# Patient Record
Sex: Female | Born: 1935 | Race: White | Hispanic: No | Smoking: Former smoker
Health system: Southern US, Community
[De-identification: ages and names within clinical notes are randomized; demographics above are authoritative.]

## PROBLEM LIST (undated history)

## (undated) DIAGNOSIS — J329 Chronic sinusitis, unspecified: Secondary | ICD-10-CM

## (undated) DIAGNOSIS — I4891 Unspecified atrial fibrillation: Secondary | ICD-10-CM

## (undated) DIAGNOSIS — G459 Transient cerebral ischemic attack, unspecified: Secondary | ICD-10-CM

## (undated) DIAGNOSIS — E039 Hypothyroidism, unspecified: Secondary | ICD-10-CM

## (undated) DIAGNOSIS — R1310 Dysphagia, unspecified: Secondary | ICD-10-CM

## (undated) DIAGNOSIS — E785 Hyperlipidemia, unspecified: Secondary | ICD-10-CM

## (undated) DIAGNOSIS — N390 Urinary tract infection, site not specified: Secondary | ICD-10-CM

## (undated) DIAGNOSIS — M1990 Unspecified osteoarthritis, unspecified site: Secondary | ICD-10-CM

## (undated) DIAGNOSIS — K219 Gastro-esophageal reflux disease without esophagitis: Secondary | ICD-10-CM

## (undated) DIAGNOSIS — I10 Essential (primary) hypertension: Secondary | ICD-10-CM

## (undated) DIAGNOSIS — D649 Anemia, unspecified: Secondary | ICD-10-CM

## (undated) HISTORY — PX: CATARACT EXTRACTION: SUR2

## (undated) HISTORY — PX: INSERT / REPLACE / REMOVE PACEMAKER: SUR710

## (undated) HISTORY — PX: TOTAL KNEE ARTHROPLASTY: SHX125

## (undated) HISTORY — PX: KNEE ARTHROSCOPY: SUR90

## (undated) HISTORY — PX: PACEMAKER INSERTION: SHX728

---
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
## 1967-12-05 HISTORY — PX: VAGINAL HYSTERECTOMY: SUR661

## 1968-12-04 HISTORY — PX: MASTOIDECTOMY: SHX711

## 1969-12-04 HISTORY — PX: BILATERAL SALPINGOOPHORECTOMY: SHX1223

## 1970-12-04 HISTORY — PX: SINUS EXPLORATION: SHX5214

## 1983-12-05 HISTORY — PX: BACK SURGERY: SHX140

## 1990-12-04 HISTORY — PX: CYSTOSCOPY: SUR368

## 1999-01-28 ENCOUNTER — Encounter: Payer: Self-pay | Admitting: Orthopedic Surgery

## 1999-02-04 ENCOUNTER — Inpatient Hospital Stay (HOSPITAL_COMMUNITY): Admission: RE | Admit: 1999-02-04 | Discharge: 1999-02-09 | Payer: Self-pay | Admitting: Orthopedic Surgery

## 1999-02-09 ENCOUNTER — Inpatient Hospital Stay (HOSPITAL_COMMUNITY)
Admission: RE | Admit: 1999-02-09 | Discharge: 1999-02-14 | Payer: Self-pay | Admitting: Physical Medicine and Rehabilitation

## 1999-03-26 ENCOUNTER — Encounter: Payer: Self-pay | Admitting: Orthopedic Surgery

## 1999-03-26 ENCOUNTER — Ambulatory Visit (HOSPITAL_COMMUNITY): Admission: RE | Admit: 1999-03-26 | Discharge: 1999-03-26 | Payer: Self-pay | Admitting: Orthopedic Surgery

## 1999-04-07 ENCOUNTER — Ambulatory Visit (HOSPITAL_COMMUNITY): Admission: RE | Admit: 1999-04-07 | Discharge: 1999-04-07 | Payer: Self-pay | Admitting: Family Medicine

## 1999-04-07 ENCOUNTER — Encounter: Payer: Self-pay | Admitting: Family Medicine

## 1999-04-08 ENCOUNTER — Encounter: Payer: Self-pay | Admitting: Family Medicine

## 1999-04-08 ENCOUNTER — Ambulatory Visit (HOSPITAL_COMMUNITY): Admission: RE | Admit: 1999-04-08 | Discharge: 1999-04-08 | Payer: Self-pay | Admitting: Family Medicine

## 1999-04-26 ENCOUNTER — Encounter: Payer: Self-pay | Admitting: Internal Medicine

## 1999-04-26 ENCOUNTER — Ambulatory Visit (HOSPITAL_COMMUNITY): Admission: RE | Admit: 1999-04-26 | Discharge: 1999-04-26 | Payer: Self-pay | Admitting: Internal Medicine

## 2000-03-12 ENCOUNTER — Inpatient Hospital Stay (HOSPITAL_COMMUNITY): Admission: RE | Admit: 2000-03-12 | Discharge: 2000-03-16 | Payer: Self-pay | Admitting: Orthopedic Surgery

## 2000-03-16 ENCOUNTER — Inpatient Hospital Stay (HOSPITAL_COMMUNITY)
Admission: RE | Admit: 2000-03-16 | Discharge: 2000-03-23 | Payer: Self-pay | Admitting: Physical Medicine & Rehabilitation

## 2000-03-19 ENCOUNTER — Encounter: Payer: Self-pay | Admitting: Physical Medicine & Rehabilitation

## 2000-03-24 ENCOUNTER — Emergency Department (HOSPITAL_COMMUNITY): Admission: EM | Admit: 2000-03-24 | Discharge: 2000-03-24 | Payer: Self-pay

## 2000-04-10 ENCOUNTER — Encounter: Admission: RE | Admit: 2000-04-10 | Discharge: 2000-07-09 | Payer: Self-pay | Admitting: Orthopedic Surgery

## 2001-08-27 ENCOUNTER — Ambulatory Visit (HOSPITAL_COMMUNITY): Admission: RE | Admit: 2001-08-27 | Discharge: 2001-08-27 | Payer: Self-pay | Admitting: Family Medicine

## 2001-08-27 ENCOUNTER — Encounter: Payer: Self-pay | Admitting: Family Medicine

## 2001-09-23 ENCOUNTER — Ambulatory Visit (HOSPITAL_COMMUNITY): Admission: RE | Admit: 2001-09-23 | Discharge: 2001-09-23 | Payer: Self-pay | Admitting: Rheumatology

## 2001-09-23 ENCOUNTER — Encounter: Payer: Self-pay | Admitting: Rheumatology

## 2001-10-10 ENCOUNTER — Encounter: Admission: RE | Admit: 2001-10-10 | Discharge: 2001-11-28 | Payer: Self-pay | Admitting: Rheumatology

## 2002-01-21 ENCOUNTER — Encounter: Admission: RE | Admit: 2002-01-21 | Discharge: 2002-03-20 | Payer: Self-pay | Admitting: Rheumatology

## 2002-04-05 ENCOUNTER — Encounter: Payer: Self-pay | Admitting: Internal Medicine

## 2002-04-05 ENCOUNTER — Inpatient Hospital Stay (HOSPITAL_COMMUNITY): Admission: EM | Admit: 2002-04-05 | Discharge: 2002-04-12 | Payer: Self-pay | Admitting: *Deleted

## 2002-04-07 ENCOUNTER — Encounter: Payer: Self-pay | Admitting: Internal Medicine

## 2002-04-08 ENCOUNTER — Encounter: Payer: Self-pay | Admitting: Internal Medicine

## 2002-04-10 ENCOUNTER — Encounter: Payer: Self-pay | Admitting: Internal Medicine

## 2002-07-16 ENCOUNTER — Other Ambulatory Visit: Admission: RE | Admit: 2002-07-16 | Discharge: 2002-07-16 | Payer: Self-pay | Admitting: Family Medicine

## 2002-12-02 ENCOUNTER — Emergency Department (HOSPITAL_COMMUNITY): Admission: EM | Admit: 2002-12-02 | Discharge: 2002-12-02 | Payer: Self-pay | Admitting: Emergency Medicine

## 2002-12-02 ENCOUNTER — Encounter: Payer: Self-pay | Admitting: *Deleted

## 2002-12-04 HISTORY — PX: REPAIR / RECONSTRUCTION INTERPHALANGEAL JOINT: SUR1147

## 2003-03-30 ENCOUNTER — Encounter: Payer: Self-pay | Admitting: Specialist

## 2003-03-30 ENCOUNTER — Encounter: Admission: RE | Admit: 2003-03-30 | Discharge: 2003-03-30 | Payer: Self-pay | Admitting: Specialist

## 2003-04-30 ENCOUNTER — Encounter: Payer: Self-pay | Admitting: Emergency Medicine

## 2003-04-30 ENCOUNTER — Emergency Department (HOSPITAL_COMMUNITY): Admission: EM | Admit: 2003-04-30 | Discharge: 2003-04-30 | Payer: Self-pay | Admitting: Emergency Medicine

## 2003-09-01 ENCOUNTER — Encounter: Admission: RE | Admit: 2003-09-01 | Discharge: 2003-09-01 | Payer: Self-pay | Admitting: Orthopedic Surgery

## 2003-09-01 ENCOUNTER — Encounter: Payer: Self-pay | Admitting: Orthopedic Surgery

## 2003-09-03 ENCOUNTER — Ambulatory Visit (HOSPITAL_COMMUNITY): Admission: RE | Admit: 2003-09-03 | Discharge: 2003-09-03 | Payer: Self-pay | Admitting: Orthopedic Surgery

## 2003-09-03 ENCOUNTER — Ambulatory Visit (HOSPITAL_BASED_OUTPATIENT_CLINIC_OR_DEPARTMENT_OTHER): Admission: RE | Admit: 2003-09-03 | Discharge: 2003-09-03 | Payer: Self-pay | Admitting: Orthopedic Surgery

## 2003-11-10 ENCOUNTER — Encounter: Admission: RE | Admit: 2003-11-10 | Discharge: 2003-11-10 | Payer: Self-pay | Admitting: Specialist

## 2003-11-16 ENCOUNTER — Ambulatory Visit (HOSPITAL_COMMUNITY): Admission: RE | Admit: 2003-11-16 | Discharge: 2003-11-16 | Payer: Self-pay | Admitting: Gastroenterology

## 2004-02-09 ENCOUNTER — Encounter: Admission: RE | Admit: 2004-02-09 | Discharge: 2004-02-23 | Payer: Self-pay | Admitting: Orthopedic Surgery

## 2004-03-30 ENCOUNTER — Encounter: Admission: RE | Admit: 2004-03-30 | Discharge: 2004-06-28 | Payer: Self-pay | Admitting: Orthopedic Surgery

## 2004-05-31 ENCOUNTER — Encounter: Admission: RE | Admit: 2004-05-31 | Discharge: 2004-08-24 | Payer: Self-pay | Admitting: Orthopedic Surgery

## 2004-07-23 ENCOUNTER — Encounter: Admission: RE | Admit: 2004-07-23 | Discharge: 2004-07-23 | Payer: Self-pay | Admitting: Family Medicine

## 2004-08-22 ENCOUNTER — Encounter: Admission: RE | Admit: 2004-08-22 | Discharge: 2004-08-22 | Payer: Self-pay | Admitting: Orthopedic Surgery

## 2005-02-27 ENCOUNTER — Inpatient Hospital Stay (HOSPITAL_COMMUNITY): Admission: RE | Admit: 2005-02-27 | Discharge: 2005-03-03 | Payer: Self-pay | Admitting: Specialist

## 2005-02-27 ENCOUNTER — Ambulatory Visit: Payer: Self-pay | Admitting: Physical Medicine & Rehabilitation

## 2005-03-03 ENCOUNTER — Inpatient Hospital Stay
Admission: RE | Admit: 2005-03-03 | Discharge: 2005-03-17 | Payer: Self-pay | Admitting: Physical Medicine & Rehabilitation

## 2005-04-10 ENCOUNTER — Encounter: Admission: RE | Admit: 2005-04-10 | Discharge: 2005-07-09 | Payer: Self-pay | Admitting: Specialist

## 2005-06-11 ENCOUNTER — Emergency Department (HOSPITAL_COMMUNITY): Admission: EM | Admit: 2005-06-11 | Discharge: 2005-06-11 | Payer: Self-pay | Admitting: Emergency Medicine

## 2005-07-18 ENCOUNTER — Encounter: Admission: RE | Admit: 2005-07-18 | Discharge: 2005-08-14 | Payer: Self-pay | Admitting: Specialist

## 2005-07-24 ENCOUNTER — Emergency Department (HOSPITAL_COMMUNITY): Admission: EM | Admit: 2005-07-24 | Discharge: 2005-07-24 | Payer: Self-pay | Admitting: Emergency Medicine

## 2005-08-15 ENCOUNTER — Encounter: Admission: RE | Admit: 2005-08-15 | Discharge: 2005-09-20 | Payer: Self-pay | Admitting: Specialist

## 2005-09-21 ENCOUNTER — Encounter: Admission: RE | Admit: 2005-09-21 | Discharge: 2005-12-20 | Payer: Self-pay | Admitting: Specialist

## 2005-10-29 ENCOUNTER — Inpatient Hospital Stay (HOSPITAL_COMMUNITY): Admission: EM | Admit: 2005-10-29 | Discharge: 2005-11-08 | Payer: Self-pay | Admitting: Emergency Medicine

## 2005-11-01 ENCOUNTER — Encounter: Payer: Self-pay | Admitting: Cardiology

## 2005-11-02 ENCOUNTER — Ambulatory Visit: Payer: Self-pay | Admitting: Physical Medicine & Rehabilitation

## 2005-11-07 ENCOUNTER — Encounter: Payer: Self-pay | Admitting: Internal Medicine

## 2005-11-08 ENCOUNTER — Inpatient Hospital Stay: Admission: RE | Admit: 2005-11-08 | Discharge: 2005-11-11 | Payer: Self-pay | Admitting: Internal Medicine

## 2005-11-08 ENCOUNTER — Ambulatory Visit (HOSPITAL_COMMUNITY): Admission: RE | Admit: 2005-11-08 | Discharge: 2005-11-08 | Payer: Self-pay | Admitting: Internal Medicine

## 2006-04-26 ENCOUNTER — Encounter: Admission: RE | Admit: 2006-04-26 | Discharge: 2006-05-20 | Payer: Self-pay | Admitting: Orthopedic Surgery

## 2006-05-21 ENCOUNTER — Encounter: Admission: RE | Admit: 2006-05-21 | Discharge: 2006-07-29 | Payer: Self-pay | Admitting: Orthopedic Surgery

## 2006-07-30 ENCOUNTER — Encounter: Admission: RE | Admit: 2006-07-30 | Discharge: 2006-09-04 | Payer: Self-pay | Admitting: Orthopedic Surgery

## 2006-09-05 ENCOUNTER — Encounter: Admission: RE | Admit: 2006-09-05 | Discharge: 2006-10-09 | Payer: Self-pay | Admitting: Orthopedic Surgery

## 2006-10-10 ENCOUNTER — Encounter: Admission: RE | Admit: 2006-10-10 | Discharge: 2006-10-29 | Payer: Self-pay | Admitting: Orthopedic Surgery

## 2006-10-30 ENCOUNTER — Encounter: Admission: RE | Admit: 2006-10-30 | Discharge: 2007-01-28 | Payer: Self-pay | Admitting: Orthopedic Surgery

## 2006-12-04 HISTORY — PX: SHOULDER SURGERY: SHX246

## 2007-02-06 ENCOUNTER — Encounter: Admission: RE | Admit: 2007-02-06 | Discharge: 2007-05-07 | Payer: Self-pay | Admitting: Orthopedic Surgery

## 2007-02-06 ENCOUNTER — Ambulatory Visit (HOSPITAL_COMMUNITY): Admission: RE | Admit: 2007-02-06 | Discharge: 2007-02-06 | Payer: Self-pay | Admitting: Family Medicine

## 2007-02-15 ENCOUNTER — Ambulatory Visit (HOSPITAL_COMMUNITY): Admission: RE | Admit: 2007-02-15 | Discharge: 2007-02-15 | Payer: Self-pay | Admitting: Gastroenterology

## 2007-03-21 ENCOUNTER — Encounter (INDEPENDENT_AMBULATORY_CARE_PROVIDER_SITE_OTHER): Payer: Self-pay | Admitting: *Deleted

## 2007-03-21 ENCOUNTER — Ambulatory Visit (HOSPITAL_COMMUNITY): Admission: RE | Admit: 2007-03-21 | Discharge: 2007-03-21 | Payer: Self-pay | Admitting: Gastroenterology

## 2007-04-11 ENCOUNTER — Inpatient Hospital Stay (HOSPITAL_COMMUNITY): Admission: RE | Admit: 2007-04-11 | Discharge: 2007-04-16 | Payer: Self-pay | Admitting: Orthopedic Surgery

## 2007-05-02 ENCOUNTER — Inpatient Hospital Stay (HOSPITAL_COMMUNITY): Admission: AD | Admit: 2007-05-02 | Discharge: 2007-05-06 | Payer: Self-pay | Admitting: Orthopedic Surgery

## 2007-05-02 ENCOUNTER — Ambulatory Visit: Payer: Self-pay | Admitting: Internal Medicine

## 2007-12-05 HISTORY — PX: HEMORRHOID SURGERY: SHX153

## 2008-05-25 ENCOUNTER — Encounter (INDEPENDENT_AMBULATORY_CARE_PROVIDER_SITE_OTHER): Payer: Self-pay | Admitting: General Surgery

## 2008-05-25 ENCOUNTER — Ambulatory Visit (HOSPITAL_COMMUNITY): Admission: RE | Admit: 2008-05-25 | Discharge: 2008-05-25 | Payer: Self-pay | Admitting: General Surgery

## 2009-04-01 ENCOUNTER — Emergency Department (HOSPITAL_COMMUNITY): Admission: EM | Admit: 2009-04-01 | Discharge: 2009-04-01 | Payer: Self-pay | Admitting: Emergency Medicine

## 2009-05-27 ENCOUNTER — Ambulatory Visit: Payer: Self-pay | Admitting: Surgery

## 2009-12-02 ENCOUNTER — Encounter: Admission: RE | Admit: 2009-12-02 | Discharge: 2009-12-02 | Payer: Self-pay | Admitting: Family Medicine

## 2011-03-15 LAB — PROTIME-INR
INR: 1.9 — ABNORMAL HIGH (ref 0.00–1.49)
Prothrombin Time: 22.4 seconds — ABNORMAL HIGH (ref 11.6–15.2)

## 2011-04-18 NOTE — Op Note (Signed)
[name], [name]             ACCOUNT NO.:  [id number]   MEDICAL RECORD NO.:  [id number]          PATIENT TYPE:  INP   LOCATION:  5003                         FACILITY:  [facility]   PHYSICIAN:  Almedia Balls. Ranell Patrick, M.D. DATE OF BIRTH:  November 06, 1931   DATE OF PROCEDURE:  05/02/2007  DATE OF DISCHARGE:                               OPERATIVE REPORT   PREOPERATIVE DIAGNOSIS:  Left shoulder infection.   POSTOPERATIVE DIAGNOSIS:  Left shoulder infection, superficial.   PROCEDURE PERFORMED:  Left shoulder incision and drainage, and placement  of Jackson-Pratt drain.   ATTENDING SURGEON:  Almedia Balls. Ranell Patrick, M.D.   ASSISTANT:  None.   ANESTHESIA:  General.   ESTIMATED BLOOD LOSS:  Minimal.   FLUIDS REPLACED:  1000 mL crystalloid.   COUNTS:  Correct.   COMPLICATIONS:  None.   URINE OUTPUT:  700 mL.   INDICATIONS:  The patient is a 77 year old female status post left  shoulder arthroplasty now about a month ago.  The patient presented with  a several day history of increasing swelling, redness, and some drainage  coming from the inferior portion of her incision.  The patient  clinically has an infection and a little bit of dehiscence in her  inferior wound.  Due to the patient's arthroplasty, we are recommending  to the patient and her family that we take her to surgery for I&D to  prevent this from becoming a septic joint.  They are in agreement with  this.  Informed consent was obtained.   DESCRIPTION OF PROCEDURE:  After an adequate level of anesthesia was  achieved, the patient was positioned in modified beach-chair position.  All neurovascular structures were padded appropriately.  Left shoulder  was sterilely prepped and draped in the usual manner.  We opened up the  patient's prior deltopectoral incision.  Over the inferior extent there  appeared to be some darker serous fluid that was coming out from the  wound.  We cultured this down deep, which did go into the deep  subcutaneous layer.  We obtained aerobic/anaerobic cultures and Gram  stain.  At this point we debrided all necrotic tissue.  This did not go  deep to muscle, and the deltopectoral interval was non-violated.  This  extended only for about 3 or 4 cm.  We went ahead and opened up the  bottom 3/4 of the incision, just to make sure we had a good look at the  entire field.  Again, all necrotic tissue was debrided and this was just  a little bit of marginal necrosis in the fat area.  It did appear that  there have been some liquefaction of the fat, which may contribute to a  seroma and possibly stitch abscess colonizing that.  We thus evacuated  out all of the fluid.  We did not find this going deep into the joint.  We did a pulse irrigation with 3 L of normal saline irrigation.  All  wound margins were bleeding nicely, and all necrotic tissue was removed.  We then closed over a flat JP drain with a combination of simple and  vertical  mattress 2-0 nylon suture, closing the wound completely.  We  will pull that drain tomorrow and wind up placing her on IV vancomycin  and Fortaz per Jonny Ruiz Campbell's recommendation with Infectious Diseases.  We will be tracking her culture data closely, and will adjust  antibiotics as needed.      Almedia Balls. Ranell Patrick, M.D.  Electronically Signed     SRN/MEDQ  D:  05/02/2007  T:  05/03/2007  Job:  [id number]
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
## 2011-04-21 NOTE — Op Note (Signed)
[name], [name]             ACCOUNT NO.:  [id number]   MEDICAL RECORD NO.:  [id number]          PATIENT TYPE:  INP   LOCATION:  2550                         FACILITY:  [facility]   PHYSICIAN:  Almedia Balls. Ranell Patrick, M.D. DATE OF BIRTH:  09-27-1931   DATE OF PROCEDURE:  04/11/2007  DATE OF DISCHARGE:                               OPERATIVE REPORT   PREOPERATIVE DIAGNOSIS:  Left shoulder osteoarthritis, end stage, and  likely secondary to rotator cuff deficiency.   POSTOPERATIVE DIAGNOSIS:  Left shoulder osteoarthritis, end stage, and  likely secondary to rotator cuff deficiency.   PROCEDURE PERFORMED:  Left shoulder hemiarthroplasty using the DePuy  [employer] Advantage system with CTA head.   ATTENDING SURGEON:  Almedia Balls. Ranell Patrick, M.D.   ASSISTANT SURGEON:  Donnie Coffin. Durwin Nora, P.A.   ANESTHESIA:  General anesthesia was used.   ESTIMATED BLOOD LOSS:  200 mL.   FLUID REPLACEMENT:  1200 mL crystalloid.   SURGICAL COUNTS:  Instrument counts were correct.   COMPLICATIONS:  None.   MEDICATIONS:  Perioperative antibiotics were given.   INDICATIONS FOR PROCEDURE:  The patient is a 77 year old female with a  history of prior left shoulder surgery who has gone on to rotator cuff  deficiency and chronic pain in the shoulder.  The patient has bone-on-  bone on the axillary radial radiograph and crepitance with range of  motion.  She has had profound functional loss and debilitating pain  secondary to her shoulder condition.  She presents now for operative  shoulder arthroplasty to restore function and eliminate pain.  Informed  consent was obtained.   DESCRIPTION OF PROCEDURE:  After an adequate level of anesthesia was  achieved, the patient was positioned in the modified beach-chair  position, and all neurovascular structures were padded appropriately,  the left shoulder was sterilely prepped and draped in the usual manner.  Entered the shoulder through a deltopectoral approach, starting  at the  coracoid and extending distally to the anterior aspect of the humerus.  Dissection was carried down to the subcutaneous tissues and found the  cephalic vein took it laterally with the deltoid and the pectoralis  medially, released the upper 0.5 cm of pectoralis, identified the  conjoint tendon and retracted that medially.  There was quite a bit of  bursitis present in the shoulder and we removed all the bursal tissue,  identified the sub scapularis which was in terrible condition and  removed that off the lesser tuberosity.  The patient's preoperative  external rotation was about 70 degrees.  Thus I was suspicious, based on  arthritis and 70 degrees of external rotation, that the patient had not  much in the way of a sub scapularis.  She did wind up having some  adequate tissue with the sub scapularis, it just appeared to be very,  very atrophied.   We went ahead and placed two #2 FiberWire sutures in the Mason-Allen  suture technique into the free end of the sub scapularis, released it  off the anterior glenoid, tried to free that up off the inferior portion  of the coracoid as well.  We then  retracted that out of the way,  identified the patient's rotator cuff which was basically scarred to the  glenoid.  It was in terrible condition as well and basically it appeared  all to be scar tissue and not much in the way of healthy-looking tendon.  We removed all that tissue, exposing the superior humeral head.  All  extraneous soft tissue was removed.   We then prepared the humerus in the following manner:  First we  performed a cut with the oscillating saw for the head.  We did this with  the elbow in 20 degrees of external rotation using a neck cut guide.  Once we had cut that off, we sized the humeral head to a size 48.  We  then went ahead and sequentially reamed up to a size 12 and then with a  good decent bite with the 12, and then went ahead and used our broach to  broach up  to a size 12 implant as well.  This was a Theme park manager  system by [employer].  Went ahead and inserted the 12 trial stem in place,  then placed a 48 x 18 head initially which had good coverage, but not  quite enough soft tissue tension force, as her deltoid was in terrible  condition as well.  We went in and selected a 23 mm thick implant to  further tension the soft tissues; that seemed to do a lot better, with  posterior head translation of 50%, and inferior head translation of 50%.  We then selected these as the real implants, the DePuy Global Advantage  stem, followed by the 48 x 23 head.  We removed the trial implants,  thoroughly irrigated, placed drill holes in the lesser tuberosity  through which we placed #2 FiberWire suture.  We then inserted the real  implant with patch and grafting with a cancellous graft taken from the  head.  We had a nice, secure fit.   We placed the CTA head 23 mm x 48 mm in place, impacted that in place,  reduced the shoulder, had nice soft tissue stability and balance.  Then  repaired the sub scapularis to the sutures that were coming out of the  lesser tuberosity with good repair of the soft tissue there.  We then  went ahead and thoroughly irrigated and then closed the deltopectoral  interval with 0 Vicryl suture; followed by 2-0 Vicryl subcutaneous; and  4-0 Monocryl for the skin.  The patient's passive rotation was  outstanding, nice and smooth, with no instability whatsoever.  The  patient's CA ligament was visualized during surgery and appeared to be  in pristine condition.      Almedia Balls. Ranell Patrick, M.D.  Electronically Signed     SRN/MEDQ  D:  04/11/2007  T:  04/11/2007  Job:  [id number]

## 2011-04-21 NOTE — H&P (Signed)
[name], [name]             ACCOUNT NO.:  [id number]   MEDICAL RECORD NO.:  [id number]          PATIENT TYPE:  INP   LOCATION:  1412                         FACILITY:  [facility]   PHYSICIAN:  Melissa L. Ladona Ridgel, MD  DATE OF BIRTH:  December 20, 1930   DATE OF PROCEDURE:  DATE OF DISCHARGE:                      STAT - MUST CHANGE TO CORRECT WORK TYPE   CHIEF COMPLAINT:  Chest pain, headache.   PRIMARY CARE PHYSICIAN:  Holley Bouche, M.D.   HISTORY OF PRESENT ILLNESS:  This is a 77 year old white female who presents  with the complaint of chest pain, worse with deep inspiration and traveling  into the neck and the back of her neck as well as her shoulders.  The  patient states nothing has made it better and nothing has made it worse.  It  is associated with a headache.  There in no change in the pain when she  changes position from sitting up to lying down.  She also is a little bit  dizzy.  The patient states that three months ago she had a similar  presentation and was treated for a urinary tract infection with Cipro, and  her symptoms went away.  It is the first time in awhile that she has had any  discomfort.  In the emergency room, the patient was found to have increased  D-dimer.  Thus, a CT was done which showed no obvious PE, but she does have  a small pleural effusion.   REVIEW OF SYSTEMS:  She has a headache, urinary tract infection, chronic  back pain, poor vision with dry eyes, hypercholesterolemia, gout,  hypertension. All other review of systems are negative.   PAST SURGICAL HISTORY:  1.  Knee replacement.  2.  Two back surgeries.  3.  Left shoulder surgery.   ALLERGIES:  SULFA AND DEMEROL.  DEMEROL REALLY JUST CAUSES RESTLESSNESS.   CURRENT MEDICATIONS:  1.  Cipro 500 b.i.d. daily for the last six days.  2.  Restasis one drop b.i.d.  3.  Nexium 40 mg daily.  4.  Potassium, unknown dose.  5.  Premarin 1.25 daily.  6.  Zocor, unknown dose.  7.  Cozaar 50 mg  daily.  8.  Nasonex one spray q.h.s.  9.  Cymbalta, dose is unrecognized.  10. Tylenol with Codeine No. 3., as needed.  11. Lasix 40 mg b.i.d.  12. She takes a thyroid medication but does not know which one it is.  13. Detrol 80 mg q.h.s.  14. Allopurinol 200 mg daily.  15. Lopressor 50 b.i.d.  16. Celebrex 200 mg b.i.d.   PHYSICAL EXAMINATION:  VITAL SIGNS:  Temperature 97.7, blood pressure last  of 91/53; however, previous blood pressures were within normal limits at  142/82 and 139/53; pulse 77, respirations 16, saturation 95% on room air.  GENERAL:  This is a mildly distressed white female secondary to a headache  and chest discomfort.  HEENT:  She is normocephalic, atraumatic.  Pupils are equal, round, and  reactive to light.  Extraocular muscles are intact.  Mucous membranes are  moist.  NECK:  Supple.  There is no JVD, no  lymph nodes, no thyromegaly.  CHEST:  Decreased breath sounds at the left base greater than right with  crackles in deep inspiration.  CARDIOVASCULAR:  Regular rate and rhythm with frequent PVCs.  Positive S1  and S2.  No S3 or S4.  2/6 systolic ejection murmur at the left sternal  border.  The patient has reproducible chest wall pain to palpation.  ABDOMEN:  Soft, nondistended, with tenderness in all quadrants.  Palpation  in the epigastrium is the more focal area involving the left upper quadrant.  EXTREMITIES:  No clubbing, cyanosis, or edema.  NEUROLOGIC:  Power is intact at 5/5.  DTRs 2+.  Cranial nerves II-XII appear  to be intact.  Finger-to-nose is intact.   LABORATORY DATA:  LFTs are within normal limits.  Point-of-care enzymes are  negative x3.   ASSESSMENT:  This is a 77 year old white female with persistent atypical  chest pain x1 day.  By description, this sounds noncardiac, but  electrocardiogram has changes consistent with frequent premature ventricular  contractions and old myocardial infarction with Q waves in leads II, III,  aVF.    PLAN:  1.  Cardiovascular.  Atypical chest pain.  She needs to be ruled out on      cardiac enzymes.  Will check them q.8h. x3 sets.  Will check a 2-D      echocardiogram on Monday.  For now, we will give her Mylanta x1 and      continue her Protonix b.i.d.  Will consult [facility] in the      morning.  2.  Pulmonary.  CT of the chest is negative for PE.  She does have      atelectasis with decreased inspirations secondary to pain.  Flutter      valve and nebulizer treatments for this.  3.  Gout.  Will continue her allopurinol; however, I believe that the dose      should be 100 mg once daily because of her decreased creatinine      clearance.  4.  GU.  She has recently been treated for a UTI with Cipro x6 days.  I will      discontinue this but follow up on her urine culture.  5.  Endocrine - hypothyroid.  The patient states that she recently started      medications but she does not know the name.  Will attempt to find out      from her pharmacy the name of her thyroid replacement.  6.  Neurologic.  She has headaches persistently.  The patient usually takes      Fiorinal when she gets a headache like this.  There are no focal      deficits noted.  Will get a CT of the chest if her symptoms persist.  7.  GI.  Possible reflux could explain the anterior chest wall pain and      persistent pain despite pain medications.  We will continue her Protonix      q.12h., give her a dose of Mylanta now, and consider GI consult if      indeed she      continues to have noncardiac chest pain.  8.  Psychiatric.  Continue her Cymbalta once we clarify her amounts.  9.  DVT prophylaxis with Lovenox 1 mg per kg.      Melissa L. Ladona Ridgel, MD  Electronically Signed     MLT/MEDQ  D:  10/29/2005  T:  10/29/2005  Job:  [id number]  cc:   Holley Bouche, M.D.  Fax: [phone]

## 2011-04-21 NOTE — H&P (Signed)
NAMEKEITHA, [name]             ACCOUNT NO.:  [id number]   MEDICAL RECORD NO.:  [id number]          PATIENT TYPE:  AMB   LOCATION:  ENDO                         FACILITY:  [facility]   PHYSICIAN:  Bernette Redbird, M.D.   DATE OF BIRTH:  January 25, 1931   DATE OF ADMISSION:  03/21/2007  DATE OF DISCHARGE:  03/21/2007                              HISTORY & PHYSICAL   CHIEF COMPLAINT:  Left shoulder pain.   HISTORY OF PRESENT ILLNESS:  The patient is a 77 year old female  complaining about worsening of the left should pain that has been  refractory to conservative treatment.  The patient has elected to have a  hemiarthroplasty by Dr. Malon Kindle.   PAST MEDICAL HISTORY:  1. Gastroesophageal reflux disease.  2. Recurrent urinary tract infections.  3. Hypothyroidism.  4. Osteoarthritis.   FAMILY MEDICAL HISTORY:  Coronary artery disease and hypertension.   SOCIAL HISTORY:  A patient of Dr. Tiburcio Pea.  She is divorced and retired.   ALLERGIES:  SULFA.   MEDICATIONS:  1. Levothyroxine 0.025 mcg p.o. daily.  2. Sotalol 80 mg p.o. b.i.d.  3. Simvastatin 40 mg p.o. daily.  4. Nexium 40 mg p.o. daily.  5. Allopurinol 100 mg p.o. b.i.d.  6. Potassium 10 mEq p.o. b.i.d.  7. Lasix 40 mg p.o. once.  8. Cymbalta 60 mg p.o. daily.  9. Detrol 4 mg 2 tablets nightly.  10.Celebrex 200 mg daily p.r.n.   REVIEW OF SYSTEMS:  Pain with range of motion in the left shoulder and  also a concurrent urinary tract infection being treated by Cipro.   PHYSICAL EXAMINATION:  VITAL SIGNS:  Pulse 82, respirations 18, blood  pressure 126/78.  GENERAL:  This is a healing appearing 77 year old female in no acute  distress.  Alert and oriented here in the office today.  Appropriate  affect her patient.  NECK:  Full range of motion without any tenderness.  NEURO:  Cranial nerves II-XII grossly intact.  Intact bilateral upper  and lower extremities.  CHEST:  Has active breath sounds bilaterally with no wheezes,  rales, or  rhonchi.  HEART:  Shows regular rate and rhythm.  No murmur.  ABDOMEN:  Nontender, nondistended with active bowel sounds.  EXTREMITIES:  Show moderate tenderness with range of motion of the left  shoulder, especially with any type of forward flexion past 80 degrees or  external rotation past 30 degrees.  SKIN: Shows no rashes.   IMAGING:  X-rays showed left shoulder osteoarthritis.   IMPRESSION:  Left shoulder osteoarthritis, which is end-stage.   PLAN:  The plan of action of the left shoulder hemiarthroplasty by Dr.  Malon Kindle.      Thomas B. Ferne Coe.    ______________________________  Bernette Redbird, M.D.    TBD/MEDQ  D:  03/28/2007  T:  03/28/2007  Job:  [id number]

## 2011-04-21 NOTE — Op Note (Signed)
[name], [name]             ACCOUNT NO.:  [id number]   MEDICAL RECORD NO.:  [id number]          PATIENT TYPE:  AMB   LOCATION:  ENDO                         FACILITY:  [facility]   PHYSICIAN:  Bernette Redbird, M.D.   DATE OF BIRTH:  1931/06/17   DATE OF PROCEDURE:  03/21/2007  DATE OF DISCHARGE:                               OPERATIVE REPORT   PROCEDURE:  Upper endoscopy (duodenoscopy)   INDICATIONS:  A 77 year old female with dilated bile duct noted as part  of evaluation for persisting right-sided abdominal pain of unclear  cause.  Rule out ampullary tumor since the bile duct appeared to be slightly  dilated all the way down to the level of the ampulla.   FINDINGS:  No evidence of proximal duodenal tumor.   DESCRIPTION OF PROCEDURE:  The procedure had been discussed with the  patient and she provided written consent.  Sedation was fentanyl 100 mcg  and Versed 10 mg IV prior to and during the course of the procedure  without any arrhythmias or desaturation any arrhythmias or desaturation,  to a level of mild to moderate sedation.   The Pentax video duodeno scope was used for this procedure because the  main purpose was to visualize the ampullary region.  It was passed  blindly into the esophagus without difficulty.  The esophageal mucosa,  where visualized, was normal although due to the side-viewing character  of the scope, of course tubular visualization of the esophagus was not  achieved.   The stomach contained a small bilious residual.  There was a little bit  of antral erythema but no erosions, ulcers, polyps or masses were seen  anywhere in the stomach.  It is felt that a pretty good gastric  evaluation was performed including a retroflexed view of the proximal  stomach, although visualization of the cardia itself was not  accomplished.  The pylorus looked normal.  The duodenum was entered and  was examined back and forth approximately four times.  I visualized what  I believe was the major papilla as evidenced by a fairly typical  papillary appearance and a longitudinal fold, although it was not  draining any bile nor was there any bile in the duodenal lumen at the  time of this exam.  Back-and-forth and fairly far down the duodenum, I  examined and reinspected without any evidence of a mass lesion tumor,  friability, diverticular change, or other anatomic abnormalities.  It  did appear that there was at least a possibility that the major papilla  was sort of bi-lobed or consisted of two papillae, one of which  conceivably could be draining the major pancreatic duct.  A definite  minor papilla was not identified.   The scope was then removed from the patient.  She tolerated the  procedure well and there were no apparent complications.   IMPRESSION:  Grossly normal upper endoscopic evaluation using  duodenoscope, without source of biliary ductal dilatation endoscopically  evident (793.3).   PLAN:  For the moment, no further evaluation of the patient's biliary  system is anticipated, although we discussed options at ERCP or  EUS.  I  would favor orthopedic or neurosurgical evaluation for the right-sided  pain to help make sure it is not due to a radiculopathy           ______________________________  Bernette Redbird, M.D.     RB/MEDQ  D:  03/21/2007  T:  03/21/2007  Job:  [id number]   cc:   Holley Bouche, M.D.

## 2011-04-21 NOTE — Discharge Summary (Signed)
[name], [name]             ACCOUNT NO.:  [id number]   MEDICAL RECORD NO.:  [id number]          PATIENT TYPE:  INP   LOCATION:  5039                         FACILITY:  [facility]   PHYSICIAN:  Almedia Balls. Ranell Patrick, M.D. DATE OF BIRTH:  Jul 25, 1931   DATE OF ADMISSION:  04/11/2007  DATE OF DISCHARGE:  04/15/2007                         DISCHARGE SUMMARY - REFERRING   ADMISSION DIAGNOSIS:  Left shoulder osteoarthritis which is end stage  secondary to rotator cuff deficiency.   DISCHARGE DIAGNOSES:  1. Left shoulder osteoarthritis, status post hemiarthroplasty.  2. Blood loss anemia.   BRIEF HISTORY:  The patient is a 77 year old female with a history of  prior left shoulder surgery.  She has elected to have a left shoulder  hemiarthroplasty by Dr. Malon Kindle secondary to profound pain,  worsening symptoms that were refractory to conservative treatment.   PROCEDURE:  The patient had a left shoulder hemiarthroplasty completed  by Dr. Malon Kindle on 04/11/2007.  Assistant was [employer], PA-C.  General anesthesia was used, 200 cc of estimated blood loss.  Fluid  replacement was 1200 cc.   HOSPITAL COURSE:  The patient was admitted on 04/11/2007, for the above-  stated procedure which she tolerated well.  After adequate time in post-  anesthesia care unit, she was transferred up to 5000.  On postop day  one, the patient complained of moderate pain in the left shoulder.  The  patient was able to tolerate some gentle physical therapy but her  hemoglobin and hematocrit were noted to be dropped down to 8.5 and 26.2  and, thus, she did get two units of packed red blood cells after her  hemoglobin dropped to 8.1.  On postop day two, according to the nursing  report and phone call, the patient was confused and, thus, urinalysis  was ordered which was benign.  Chest x-ray was ordered which was benign.  Do believe this was mainly due to the blood loss anemia that the patient  had  developed.  Her incision was healing well throughout the entire  stay.  No signs of erythema or drainage.  Neurovascularly, she was  intact.  She was kept in a sling.   DISCHARGE PLAN:  The patient is being discharged to [facility] on 04/15/2007.   CONDITION:  Stable.   DIET:  Low sodium.   FOLLOWUP:  The patient will follow back up with Dr. Malon Kindle in two  weeks.   ALLERGIES:  SULFA, DEMEROL, AND VIOXX.   DISCHARGE MEDICATIONS:  Allopurinol 100 mg p.o. b.i.d.; Restasis eye  drops, one drop b.i.d. each eye; Cymbalta 60 mg p.o. daily; Lasix 40 mg  b.i.d.; levothyroxine 25 mcg p.o. daily; Robaxin 500 mg p.o. q.6h.,  Tylox  80 mg p.o. daily; prednisolone ophthalmic drops, two drops each eye  q.i.d.; Zocor 40 mg p.o. q.h.s.; sotalol 80 mg p.o. b.i.d.; Detrol 4 mg  p.o. q.h.s., Vicodin 5 mg one to two tabs q.4-6h. p.r.n. pain; Dulcolax  suppositories 10 mg per rectum p.r.n.      Thomas B. Durwin Nora, P.A.      Almedia Balls. Ranell Patrick, M.D.  Electronically Signed    TBD/MEDQ  D:  04/15/2007  T:  04/15/2007  Job:  [id number]

## 2011-04-21 NOTE — Discharge Summary (Signed)
NAMEBLAKLEY, MICHNA             ACCOUNT NO.:  [id number]   MEDICAL RECORD NO.:  [id number]          [name] TYPE:  ORB   LOCATION:  4533                         FACILITY:  [facility]   PHYSICIAN:  Ranelle Oyster, M.D.DATE OF BIRTH:  1931-05-31   DATE OF ADMISSION:  03/03/2005  DATE OF DISCHARGE:  03/17/2005                                 DISCHARGE SUMMARY   DISCHARGE DIAGNOSES:  1.  L2-L5 stenosis with neurogenic claudication requiring decompression L2-      L5, foraminotomy L5.  2.  Urinary tract infection, currently treated.  3.  Hypertension.  4.  Dehydration, much improved.  5.  Sedation, resolved.   HISTORY OF PRESENT ILLNESS:  [name] is a 77 year old female with  history of hypertension, DJD, DDD with neurogenic claudication and pain in  bilateral hips and thighs secondary to mild L2-L3 stenosis, moderate to  severe L3-L4 and 4-5 stenosis, and severe degenerative changes L1-L2.  [name]  elected to undergo central decompressive laminectomy L2-L5 with foraminotomy  L5 root by Dr. Otelia Sergeant on March 27.  Postoperatively has had problems with  electrolyte abnormalities with hypokalemia, hyponatremia requiring treatment  with IV fluids.  [name] is also noted to have problems with fevers as well as  lethargy that is resolving.  [name] is noted to have problems with urinary  retention with question of UTI and was started on empiric antibiotics today.  Therapies were initiated and [name] is at mod assist for bed mobility, min  assist for transfers with max assist ambulating 8 feet with slow gait.  Continues to require min cues to don and doff brace affecting all activity  level.  SACU was consulted for progression.   PAST MEDICAL HISTORY:  1.  Dyslipidemia.  2.  Esophageal dilatation.  3.  Bilateral total knee replacement and shoulder surgery.  4.  Mastoidectomy.  5.  Bladder surgery x2 with incontinence.  6.  Frequent UTIs.  7.  Appendectomy.  8.  History of pyelonephritis with  bacteremia in 2003.  9.  Allergies.  10. History of CHF secondary to fluid overload.  11. Cystoscopy with laser surgery of bladder.  12. Enterocele repair.  13. Hysterectomy.  14. Oophorectomy.  15. Hypertension.  16. Gout.   ALLERGIES:  SULFA.   SOCIAL HISTORY:  [name] lives in a multilevel home with five steps at  entry.  [name] was independent prior to admission.  Son sleeps over at night  and assists the RN.  [name] does not use any tobacco or alcohol.   HOSPITAL COURSE:  [name] was admitted to [facility] on March 03, 2005 for  SACU level therapies to consist of PT/OT daily.  At time of admission  [name] continued to have a lot of complaints regarding pain and spasms.  Low dose Neurontin was started to help with neuropathy.  [name] was  initially on Cipro secondary to questionable UTI.  Urine culture drawn  initially on April 1 showed no growth.  [name] was noted to have problems  with urinary retention requiring in-and-out catheterization.  Her Detrol LA  was held.  A repeat UA was done on  April 5 showing E. coli that was  resistant to Cipro and ampicillin.  [name] was started on Macrodantin and is  currently being treated with this for 10 total days of antibiotic therapy.  Secondary to urinary retention [name] was started on Urecholine with dose  slowly being increased to help with voiding problems.  Secondary to  continued complaints of flank pain, OxyContin CR was added as well as  Baclofen to help with spasticity problems.  However, with narcotics onboard,  [name] was noted to develop lethargy with confusion and disorientation.  This was discontinued with pain being managed by Tylenol alone which greatly  improved [name]'s symptomatology.  Neurontin continues onboard and has  helped with her neuropathy.  As [name]'s mobility improved, bladder  functioning slowly normalized.  Urecholine was increased initially to 50  t.i.d.; however, [name] was noted to have side effects of  diarrhea with  this.  This dose was decreased to 25 b.i.d. and currently [name] is voiding  without difficulty.  [name] was given a taper of Urecholine over the next two  weeks and has been advised not to resume Detrol LA.   Laboratories done past admission showed [name] to be dehydrated with BUN at  37, creatinine at 1.7.  [name] was encouraged to push p.o. fluids.  However,  [name] continued with renal insufficiency with BUN 36, creatinine 1.6, and low  blood pressures.  The [name]'s Cozaar and Lasix was held and continues to  be on hold currently.  [name]'s blood pressures have been monitored on  b.i.d. basis and are ranging from 110s-150s systolic, 60s-70s diastolic on  Lopressor 50 mg b.i.d.  Home health nurse has been arranged to follow up  with BP checks and [name] to follow up with LMD in two weeks for recheck  blood pressures with instructions regarding resuming medications.  The  [name]'s postoperative anemia was followed along.  CBC last March 30 showed  H&H 9.7 and 28.1.  This was improved on April 3 with hemoglobin at 11.3,  32.9, however, question laboratory error as repeat CBC of April 12 showed  H&H at 9.7, 28.8.  [name] did have issues with hematochezia.  Did report a  history of hemorrhoids.  However, recheck CBC of April 13 showed some drop  with hemoglobin of 9.3, hematocrit 28.0.  Dr. Luther Parody, GI was consulted  for input.  He felt [name]'s bleeding was likely due to hemorrhoids.  No  worrisome ongoing bleeding noted.  He recommended discharging [name] on  Anusol [facility] suppositories with outpatient follow-up.   At time of discharge [name]'s pain is well controlled on p.r.n. Tylenol.  [name] denies ever having any pain.  Dr. Otelia Sergeant had followed up on [name] and  recommended resuming Celebrex 20 mg b.i.d. basis; however, secondary to  renal insufficiency as well as hematochezia [name] has been instructed  regarding side effects of GI bleed as well as worsening of  renal insufficiency with recommendations for close follow-up with Dr. Otelia Sergeant and  her LMD if Celebrex was resumed.  [name]'s back incision has been healing  well without any signs or symptoms of infection.  This is intact.  A brace  is currently to be used on p.r.n. basis.  Given her stay in subacute, Ms.  Whitehead progressed to modified independent level for ADLs, modified  independent for toileting.  [name] is modified independent for transfers,  modified independent for ambulating 200 feet with rolling walker.  Further  follow-up therapies to include home health PT/OT by [facility].  On March 17, 2005 [name] is discharged to home.   DISCHARGE MEDICATIONS:  1.  Premarin 1.25 mg daily.  2.  Neurontin 100 mg q.h.s.  3.  Ambien 5 mg q.h.s.  4.  Nexium 40 mg b.i.d.  5.  Lopressor 50 mg b.i.d.  6.  Restasis 0.05% one drop OU b.i.d.  7.  __________ 40 mg daily.  8.  Prozac 20 mg daily.  9.  Macrobid 100 mg b.i.d.  10. Celebrex 200 mg per day x5 days, then discontinue.  11. Urecholine 25 mg b.i.d. x1 week, then 25 mg a day x1 week, then      discontinue.   ACTIVITY:  Follow back precautions.   DIET:  Regular.  Drink plenty of fluids.   WOUND CARE:  Keep area clean and dry.   SPECIAL INSTRUCTIONS:  Tylenol as needed for pain.  Anusol HC suppositories  b.i.d. for a week.  Do not use Detrol LA, Lasix, K-Dur, or Cozaar.   FOLLOW-UP:  [name] to follow up with Dr. Darrelyn Hillock and Dr. Leonides Sake in  one to two weeks.  Follow up with Dr. Matthias Hughs for GI issues.  Follow up with  Dr. Riley Kill as needed.      PP/MEDQ  D:  03/17/2005  T:  03/17/2005  Job:  [id number]   cc:   Holley Bouche, M.D.  [address]  [place], [place] [address]  Fax: [phone]   Althea Grimmer. Luther Parody, M.D.  [address]  [place]  [place] [address]  Fax: [phone]   Kerrin Champagne, M.D.  [address]  [place]  [place] [address]  Fax: [phone]

## 2011-04-21 NOTE — Discharge Summary (Signed)
[place]  Patient:    [name], [name] [facility]                  MRN: [id number] Adm. Date:  [id number] Disc. Date: 03/23/00 Attending:  Faith Rogue T Dictator:   Bynum Bellows. Idacavage, P.A.C. CC:         Faith Rogue, M.D.             Trudee Grip, M.D.             Barton Fanny, M.D., [facility]                           Discharge Summary  DIAGNOSES:  1. Status post right total knee arthroplasty.  2. Coumadin anticoagulation.  3. Hypertension.  4. Hypercholesterolemia.  5. Gastroesophageal reflux disease.  6. Osteoarthritis.  7. Headache.  HISTORY OF PRESENT ILLNESS: The patient is a 77 year old female with a history of end-stage osteoarthritis who elected to undergo right total knee arthroplasty on March 12, 2000 by Dr. Despina Hick.  She was placed on Coumadin for DVT prophylaxis.  She did have a hemoglobin down to 8.6 and received 2 units of packed red blood cells for this, and her hemoglobin responded to 10.9.  She did have problems with unrelenting headache which she feels is due to pain medicine, and it was felt would require inpatient rehabilitation and she was therefore transferred to the inpatient rehabilitation unit on March 16, 2000. It is noted that she lives with her son in a two-level home with three steps to entry, and she can stay on the first floor.  Her son does work during the day.  HOSPITAL COURSE: She was admitted to [facility] rehabilitation unit, where she has participated in three hours per day of physical and occupational therapy.  She did continue to have headaches, which she felt were only relieved with Fioricet with codeine; however, she did have mild increase in her LFTs and this was discontinued due to the acetaminophen.  She was placed on scheduled Ultram as well as p.r.n. oxycodone.  She did have a fall while in the hospital and was noted to have some right orbital ecchymosis as well  as sternal soreness.  X-rays were taken of her knee and there was an intramedullary lucency which was inconsequential as noted by Dr. Despina Hick, most likely secondary to IM instrumentation utilized at surgery.  She had no evidence of fracture.  It was felt that the lucency would require evaluation again at a six week postoperative visit if it was still present on x-ray.  She was resumed on weightbearing as tolerated.  At this point in time she was felt to be a suitable candidate or discharge home due to the fact that she is modified independent in her physical therapy as well as with her ADLs.  She is currently ambulating 150 feet with a standard walker.  DISCHARGE MEDICATIONS:  1. Coumadin 3 mg 2 tablets q.d. until Apr 17, 2000.  2. Ultram 50 mg q.i.d.  3. Prilosec 20 mg q.d.  4. Trinsicon t.i.d.  5. OxyIR 5 mg 1-2 q.4h as-needed for pain.  6. Mevacor 40 mg q.d.  7. Lopressor 50 mg q.12h.  8. Triamterene/hydrochlorothiazide 37.5/25 b.i.d.  9. Micro-K 600 mg qd.  DISCHARGE ACTIVITY:  Weightbearing as tolerated, right leg.  DISCHARGE DIET: She is to follow a balanced diet.  WOUND CARE: She was instructed to clean the wound  daily.  DISCHARGE INSTRUCTIONS: She will have home health physical therapy as well as a nurse will draw a pro time on March 26, 2000 with results to be sent to Dr. Luciana Axe.  FOLLOW-UP: She is instructed to follow up with Dr. Despina Hick in two weeks and with Dr. Luciana Axe as scheduled.  DISCHARGE CONDITION: Stable. DD:  03/22/00 TD:  03/23/00 Job: [id number]

## 2011-04-21 NOTE — Discharge Summary (Signed)
NAMEARIBELLE, MCCOSH             ACCOUNT NO.:  [id number]   MEDICAL RECORD NO.:  [id number]          PATIENT TYPE:  OUT   LOCATION:  EKG                          FACILITY:  [facility]   PHYSICIAN:  Deirdre Peer. Polite, M.D. DATE OF BIRTH:  July 08, 1931   DATE OF ADMISSION:  11/08/2005  DATE OF DISCHARGE:  11/08/2005                                 DISCHARGE SUMMARY   DISCHARGE DIAGNOSES:  1.  Tachy-brady syndrome status post pacer.  2.  Status post Cardiolite, negative for ischemia.  3.  Hypothyroidism.  4.  Urinary tract infection treated.  5.  Probable pneumonia treated empirically.  6.  Paroxysmal atrial fibrillation.  7.  Gout.  8.  History of hypertension.  9.  Renal insufficiency improved.  10. Deconditioning for transfer to SACU.   MEDICATIONS:  1.  Sotalol 80 mg twice daily.  2.  Coumadin 5 mg daily, and ought to be followed by Dr. Logan Bores.   CONSULTATIONS:  Dr. Logan Bores, [facility].   PROCEDURE:  1.  Status post Cardiolite negative for ischemia.  2.  Status post percutaneous transvenous pacer.  3.  Chest x-ray atelectasis versus infiltrate of the left base.   HISTORY OF PRESENT ILLNESS:  A 77 year old white female presented to the  hospital with complaint of chest pain worse with deep inspiration in the ED.  The patient was evaluated.  Admission was deemed necessary for further  evaluation and treatment.  Please see dictated H and P for further details.   PAST MEDICAL HISTORY:  Per admission H and P.   MEDICATIONS ON ADMISSION:  Per admission H and P.   HOSPITAL COURSE:  A 77 year old female again presented to the hospital with  complaints of chest pain which appeared to be atypical for cardiac source.  The patient had cardiac enzymes which were negative for ischemia.  The first  day of hospitalization, the patient did have a rapid burst of atrial  fibrillation treated with Cardizem.  The patient was noted to have 3.5  second pauses thereafter.  Tachy-brady syndrome  was considered.  The patient  was continued to follow by cardiology, was placed on anticoagulation. The  patient eventually had adenosine Cardiolite which was negative for ischemia  and the patient was felt to have sinus dysfunction as she continued to have  paroxysmal atrial fibrillation with 4-6 second pauses.  The patient  underwent pacer placement on November 30th by Dr. Benedetto Goad.  The patient  was continued on sotalol as well as warfarin.  Post bed procedure, the  patient had low grade temperature.  The patient was further evaluated for  that and no identifiable cause was identified.  The patient was empirically  placed on broad spectrum antibiotics, Rocephin and vancomycin and she was  pan cultured.  Again all of these cultures were ultimately negative.  Because of the patient's prolonged hospitalization, she was very  deconditioned.  It was felt that SACU/rehab type care would be indicated.  On December 6th, the patient was cleared for discharge to [facility] by  cardiology.  While in SACU the patient was managed by the rehab doctors.  Deirdre Peer. Polite, M.D.  Electronically Signed     RDP/MEDQ  D:  06/13/2006  T:  06/14/2006  Job:  [id number]

## 2011-04-21 NOTE — Consult Note (Signed)
[name], [name]             ACCOUNT NO.:  [id number]   MEDICAL RECORD NO.:  [id number]          PATIENT TYPE:  ORB   LOCATION:  4533                         FACILITY:  [facility]   PHYSICIAN:  Althea Grimmer. Santogade, M.D.DATE OF BIRTH:  04-08-1931   DATE OF CONSULTATION:  03/16/2005  DATE OF DISCHARGE:                                   CONSULTATION   [name] is a 77 year old female whom I am asked to see for rectal  bleeding and mildly decreased hemoglobin.  She was admitted to the hospital  in late March with spinal stenosis and neurologic symptoms and underwent  decompressive lumbar laminectomy on March 27.  She reports to me that  postoperatively she had diarrhea for three days which she attributes to one  of the medications she was receiving.  However, she says that this stopped  by yesterday.  Her rectal area became irritated with the diarrhea and she  began having bright red blood per rectum. It is reported in the chart that  she had a formed stool with a streak of blood today.  She has a long history  of diarrhea prone irritable bowel syndrome with urgency and occasional  incontinence.  She also has had prior therapy of hemorrhoids.  She had an  upper and lower endoscopy performed in January 2005.  The upper endoscopy  was performed for regurgitation and dysphagia.  No obvious stricture was  performed, but presumptive dilatation to 18 mm was undertaken and there was  no visual evidence of reflux esophagitis.  Promotility agents were  recommended.  On colonoscopy, she was noted to have internal hemorrhoids and  two diminutive left-sided polyps were removed which were adenomatous.  These  records are placed in the hospital chart.  Currently, the patient denies  abdominal pain or weight loss.  She says she has rectal soreness which is  responding to ProctoCream.   Past medical history is pertinent for congestive heart failure,  hypothyroidism, question of renal insufficiency,  longstanding normocytic  anemia with hemoglobin of around 11, bilateral knee replacements,  appendectomy, hysterectomy, gout, and bladder surgery for incontinence.   CURRENT MEDICATIONS:  1.  Neurontin 100 mg h.s.  2.  Claritin.  3.  Zovirax.  4.  Fibercon.  5.  ProctoCream.  6.  Prednisone 10 mg daily.  7.  Urecholine 25 mg t.i.d.  8.  Oxycodone p.r.n.  9.  Premarin.  10. Valium 5 mg q.h.s.  11. Potassium chloride 20 mEq daily.  12. Allopurinol.  13. Trinsicon.  14. Vitamin with iron b.i.d.  15. Nexium 40 mg b.i.d.  16. Metoprolol 50 mg b.i.d.  17. Zocor 20 mg daily.  18. Cozaar 50 mg daily.  19. Prozac 20 mg daily.  20. Celebrex 200 mg daily.  21. Detrol 4 mg daily.  22. Nasonex.  23. Lasix 40 mg daily.  24. Macrodantin 50 mg q.i.d.   ALLERGIES:  1.  SULFA.  2.  DEMEROL.  3.  VIOXX.   FAMILY HISTORY:  Noncontributory.   SOCIAL HISTORY:  Lives at home, nonsmoker, minimal alcohol.   REVIEW OF SYSTEMS:  Negative  except as indicated above.   PHYSICAL EXAMINATION:  VITAL SIGNS:  She is afebrile, blood pressure 147/78,  pulse 73 and regular.  HEENT:  Eyes are anicteric.  Conjunctivae are mildly pale.  Oropharynx  unremarkable.  NECK:  Supple without thyromegaly or adenopathy.  There is no significant  cervical adenopathy.  CHEST:  Sounds clear.  HEART:  Regular rate and rhythm.  ABDOMEN:  Soft and nontender without mass or organomegaly.  RECTAL:  Reveals minor external hemorrhoids which are tender.  EXTREMITIES:  Without edema.   LABORATORY TESTS:  Reveal current hemoglobin of 9.3.  It is noted her  hemoglobin was 11.3 on April 3.  White blood count normal.  Platelet count  607, BUN 31, creatinine 1.1.   IMPRESSION:  Elderly female with history of internal hemorrhoids and  diarrhea prone irritable bowel syndrome who seems to have had a moderate  exacerbation of hemorrhoids related to in-hospital diarrhea.  I doubt that  she is having significant bleeding at  this time and she greatly desires to  go home.  Since she had a colonoscopy approximately a year ago without  worrisome pathology being found, I doubt that she requires invasive  investigation at this point unless problems persist.   RECOMMENDATIONS:  I would treat her at home with sitz baths and nocturnal  Anusol suppositories and follow her in the office.  She should remain on  iron on discharge.      PJS/MEDQ  D:  03/16/2005  T:  03/16/2005  Job:  [id number]   cc:   Bernette Redbird, M.D.  [address] [place]  [place], [place] [address]  Fax: [phone]

## 2011-04-21 NOTE — Discharge Summary (Signed)
NAMEPHEONIX, [name]             ACCOUNT NO.:  [id number]   MEDICAL RECORD NO.:  [id number]          PATIENT TYPE:  INP   LOCATION:  5018                         FACILITY:  [facility]   PHYSICIAN:  Kerrin Champagne, M.D.   DATE OF BIRTH:  1931/05/05   DATE OF ADMISSION:  02/27/2005  DATE OF DISCHARGE:  03/03/2005                                 DISCHARGE SUMMARY   ADMISSION DIAGNOSIS:  1.  Mild lumbar spinal stenosis L2-3.  2.  Moderate severe lumbar spinal stenosis at L3-4 and L4-5.  3.  Severe degenerative disk changes L1-2 to sacrum.  4.  Hypertension.  5.  Hiatal hernia.  6.  Gastroesophageal reflux disease.  7.  Chronic bladder infection.  8.  Gout and pseudogout.   1.  Status post bilateral knee replacements.   DISCHARGE DIAGNOSIS:  1.  Mild lumbar spinal stenosis L2-3.  2.  Moderate severe lumbar spinal stenosis at L3-4 and L4-5.  3.  Severe degenerative disk changes L1-2 to sacrum.  4.  Hypertension.  5.  Hiatal hernia.  6.  Gastroesophageal reflux disease.  7.  Chronic bladder infection.  8.  Gout and pseudogout.  9.  Status post bilateral knee replacements.   1.  Postoperative ileus resolved at discharge.  2.  Hypokalemia  and hyponatremia resolved at discharge.  3.  Posthemorrhagic anemia treated with iron supplementation.  4.  Sinusitis.  5.  Urinary tract infection treated with Cipro.  6.  Urinary incontinence, chronic in nature, exacerbated by urinary tract      infection.   PROCEDURE:  On February 27, 2005 the patient underwent central decompressive  laminectomy at L2-3, L3-4 and L4-5 with left-sided foraminotomy at the L5  nerve root. This was performed by Dr. Otelia Sergeant, assisted by Maud Deed PA-C  under general anesthesia.   CONSULTATIONS:  Physical medicine and rehabilitation.   BRIEF HISTORY:  The patient is a 77 year old female with increasing  neurogenic claudication pain into her hips and thighs with ambulation and  difficulty ambulating for any length  of time. MRI studies have demonstrated  stenosis which is most severe at the L3-4 and L4-5 level and mild  involvement in the L2-3 level. The patient was noted to have changes  consistent with hypertrophic ligamentum flavum medially affecting the  lateral recesses in the foramen at the L2-3, L3-4, and L5 levels; also  severe degenerative disk changes, hyperlordosis occurring across her entire  lumbar segment. It was felt that she would require surgical intervention to  relieve her symptoms, and was admitted for the procedure as stated above.   BRIEF HOSPITAL COURSE:  The patient tolerated the procedure under general  anesthesia without complications.  Postoperatively she was fitted for a  lumbar corset by the orthotist of [employer]. On the first postoperative day  neurovascular and motor function of the lower extremities was intact. The  patient's Hemovac drain was discontinued. Dressing changes were done daily,  thereafter, and the wound was noted to have no drainage or erythema during  the hospital stay. Postoperatively the patient did have posthemorrhagic  anemia which was stable and did not  require transfusion and was treated with  Trinsicon.   The patient's physical therapy was initiated; and she was quite slow to  progress. She had medical difficulties including hyponatremia, hypokalemia,  as well as a mild ileus which were treated during her hospital stay and did  slow her progress with physical therapy. Discharge planning consult was  obtained to assist with disposition planning. A rehab consult was obtained;  and it was felt that she would be a suitable candidate for inpatient rehab  on the subacute care unit. She was placed on the bed waiting list and  continued with the orthopedic as well as medical care until a bed was  available. The patient developed sinusitis which was treated with Chlor-  Trimeton which she ordinarily used at home. The patient's ileus was treated  with  correction of her potassium, as well as IV fluids for correction of her  hyponatremia, and laxatives and enema as necessary. She did continue with IV  fluids until she was able to take a regular diet. Her diet was advanced as  her ileus did clear.   Urinalysis after discontinuation of her Foley catheter did reveal a mild  urinary tract infection. She was treated empirically with Cipro 500 mg p.o.  b.i.d. for 3 days. Physical therapy and occupational therapy continued to  assist her until a bed was available on the rehab unit. She was stable for  transfer to the subacute care unit for continuation of her care on March 03, 2005.   PERTINENT LABORATORY VALUES:  Hemoglobin and hematocrit on admission with  values of 11.8 and 35.3 respectively. Values dropped to 8.9 and 27.3, and  stabilized to 9.7 at 28.1. Coagulation studies on admission were within  normal limits. Chemistry studies on admission with BUN 32, creatinine 1.8.  Sodium dropped to the lowest value of 128, but normalized at 136. Potassium  lowest at 3.1 and normalized to 3.9 prior transfer to subacute care unit.  BUN and creatinine were normal at the time of transfer as well. A urinalysis  as stated above. Coagulation studies on admission were within normal limits.  Chest x-ray On March 28 showed interval development of small bilateral  pleural effusions and bibasilar atelectasis. Nodular opacity, peripherally,  in the left mid lung noted. CT of the chest on March 28 was performed  showing abnormal chest x-ray; finding is attributed to a pleural-based  density in the left upper lobe felt to be postinflammatory and/or scar as  opposed to neoplasm. Also a second 6-mm nodule in the left upper lobe  abutting the fissure, etiology undetermined. Short-term CT followup  recommended to assess both of these abnormalities.. No EKG on the chart.  PLAN:  The patient was transferred to the subacute care unit. There she will  continue to  receive physical therapy for ambulation and gait training. She  will wear her corset mostly when she is out of bed, however, it is for  comfort and she may have it on and off as felt necessary. She will use a  walker for ambulation. She will avoid bending, twisting, or lifting more  than 5 pounds. Occupational therapy will assist with ADL's. Wound should  continue to be treated with daily dressing changes; and patient will be  allowed to shower when there is no drainage.   MEDICATIONS:  A list of the patient's medications were sent with her to the  rehab center and she will continue on these with adjustments made as  necessary  by the staff there.   FOLLOWUP:  Dr. Otelia Sergeant was felt to continue to follow the patient at rehab  center; and will see her also outpatient basis once she is discharged.   CONDITION ON DISCHARGE:  Stable.      Wende Neighbors, P.A.      Kerrin Champagne, M.D.  Electronically Signed    SMV/MEDQ  D:  07/27/2005  T:  07/28/2005  Job:  [id number]

## 2011-04-21 NOTE — Op Note (Signed)
[name], [name]             ACCOUNT NO.:  [id number]   MEDICAL RECORD NO.:  [id number]          PATIENT TYPE:  INP   LOCATION:  3728                         FACILITY:  [facility]   PHYSICIAN:  Francisca December, M.D.  DATE OF BIRTH:  [birth year]   DATE OF PROCEDURE:  11/02/2005  DATE OF DISCHARGE:                                 OPERATIVE REPORT   PROCEDURES PERFORMED:  1.  Insertion of dual-chamber permanent transvenous pacemaker.  2.  Right subclavian venogram.   INDICATION:  [name] is a 77 year old woman who was admitted 4 days  ago with apparent pneumonia and tachybrady syndrome. She was in atrial  fibrillation with ventricular response. Upon conversion to sinus rhythm she  had pauses as long as 5.5-6 seconds. Attempts to manage this medically had  been unsuccessful thus far, with regard to the intermittent pauses and  overall heart rate. She is brought to the cardiac catheterization laboratory  at this time for insertion of a dual-chamber permanent transvenous  pacemaker.   PROCEDURAL NOTE:  The patient is brought to the cardiac catheterization  laboratory in a fasting state. The right prepectoral region was prepped and  draped in the usual sterile fashion. Local anesthesia was obtained with  infiltration of 1% lidocaine with epinephrine throughout the left  prepectoral region. The right subclavian venogram was then performed with a  peripheral injection of 20 cc of Omnipaque. A digital cineangiogram in the  AP  projection was obtained and road mapped to guide future right  subclavian puncture. The venogram did demonstrate the vein to be widely  patent, and coursing in a normal fashion over the anterior surface of the  first rib and beneath the middle third of the clavicle.   A 6-7 cm incision was then made in the deltopectoral groove, and this was  carried down by sharp dissection and electrocautery to the prepectoral  fascia. There a plane was lifted and a pocket  formed inferiorly and  medially. The pocket was then packed with a 1% kanamycin-soaked gauze. Two  separate right subclavian punctures were then performed using an 18-gauge  thin-wall needle, through which was passed a 0.038 inch tight J guidewire..  Over the initial guidewire a  7-French tearaway sheath and dilator were  advanced. The dilator and wire were removed and the ventricular lead was  advanced to the level of the right atrium. The sheath was then torn away.  Using standard technique and fluoroscopic landmarks, the lead was  manipulated into the right ventricular apex. There, excellent pacing  parameters were obtained (as will be noted below). The lead was tested for  diaphragmatic pacing at 10 volts, and none was found. The lead was then  sutured into place using 3 separate 0 silk ligatures. Over the remaining  guidewire, and after placement of a figure-of-eight hemostasis suture at the  insertion site in the pectoralis muscle to control backbleeding, a  9-French  tearaway sheath and dilator were advanced. The dilator was removed, the wire  was allowed to remain in place, and the atrial lead was advanced to the  level of the right  atrium. The sheath was then torn away. Using standard  technique and fluoroscopic landmarks, the lead was manipulated into the  right atrial appendage. There, excellent pacing parameters were obtained (as  will be noted below). This was an active fixation lead and the screw was  advanced as appropriate. The lead was tested for diaphragmatic pacing at 10  volts, and none was found. The lead was then sutured into place using three  separate 0 silk ligatures. The remaining guidewire and the kanamycin-soaked  gauze were then removed from the pocket. The pocket was copiously irrigated  using 1% kanamycin solution. The leads were then attached to the pacing  generator, carefully identifying each by its serial number and placing each  into the appropriate  receptacle. Each lead was tightened into place and  tested for security. The leads were then wound beneath the pacing generator,  and the generator was placed in the pocket. An 0 silk anchoring suture was  placed as well. The pocket was then closed using 4-0 Vicryl in a running  fashion; 2 layers for the subcutaneous tissue. The skin was approximated  using 4-0 Vicryl in a running subcuticular fashion. Steri-Strips and a  sterile dressing were applied. The patient was transported to the recovery  area in an A-pace/V-sense mode.   EQUIPMENT DATA:  The pacing generator is a [employer] Victory XL DR (model  number [id number], serial number [id number]). The atrial lead is a  [employer] Jude model  number 1788-TC, serial number [id number]. The ventricular lead is a [employer] Jude  model number 1646-T, serial number [id number].   PACING DATA:  The ventricular lead detected a 20.8 mV R-wave. The pacing  threshold was 0.4 volts at 0.5 milliseconds pulse width. The impedance was  748 ohms, resulting in a currented capture threshold of 0.4 mA. The atrial  lead detected a 2.9 mV P-wave. The pacing threshold was 0.5 volts at 0.5  milliseconds pulse width. The impedance was 446 ohms, resulting in a  currented capture threshold of 0.7 mA.      Francisca December, M.D.  Electronically Signed     JHE/MEDQ  D:  11/02/2005  T:  11/03/2005  Job:  [id number]   cc:   Holley Bouche, M.D.  Fax: [phone]

## 2011-04-21 NOTE — Op Note (Signed)
NAME:  [name], [name]                       ACCOUNT NO.:  [id number]   MEDICAL RECORD NO.:  [id number]                   PATIENT TYPE:  AMB   LOCATION:  DSC                                  FACILITY:  [facility]   PHYSICIAN:  Dionne Ano. Everlene Other, M.D.         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   PROCEDURE:  1. CMC arthroplasty with trapezium excision, right thumb.  2. Abductor pollicis longus digastric portion tendon transfer to the first     metacarpal and FCR back upon itself (Zancolli tendon     transfer/suspension).  3. APL 1/3 proper portion tendon transfer to the FCR and back upon itself     and the APL proper with multiple figure-of-eight throws (Weilby tendon     transfer/suspension).  4. APL tenodesis, right base of thumb about 2/3 proper portion.   SURGEON:  Dionne Ano. Amanda Pea, M.D.   ASSISTANT:  Wynonia Hazard   COMPLICATIONS:  None.   ANESTHESIA:  Axillary block with IV sedation.   TOURNIQUET TIME:  1 hour 3 minutes.   DRAINS:  None.   INDICATIONS FOR PROCEDURE:  This patient is a 77 year old female who  presents with the above mentioned diagnosis.  I have counseled her in  regards to the risks and benefits of surgery including the risks of  infection, bleeding, anesthesia, damage to neural structures, and failure of  surgery to accomplish the intended.  With this in mind, she desires to  proceed.  All questions were encouraged and answered preoperatively.   OPERATIVE FINDINGS:  The patient had end stage degenerative change about the  base of her thumb joint and underwent reconstruction without difficulty.  I  was very pleased with the surgery and her  reconstruction.   PROCEDURE IN DETAIL:  The patient was seen by myself and anesthesia.  A  block was placed by anesthesia staff which was noted to be in excellent  working fashion.  She was given preoperative antibiotics and taken to the  operative suite and underwent a smooth induction of light IV sedation, had  the arm prepped and draped in the usual sterile fashion.  Following this,  she had the arm elevated and the tourniquet was inflated to 250 mmHg.  The  patient had examination under anesthesia revealing significant crepitance  and no pain signifying the block was in excellent working condition.  Following this, I made an incision under 250 mmHg tourniquet control dorsal  radially over the [facility] joint.  Dissection was carried down sharply and  bluntly with 4.0 loupe magnification.  I split the interval between the EPB  and APL.  I then opened  the capsule, placed a Freer elevator on either side  of the capsule, and then removed the trapezium with a combination of curet,  osteotome, rongeur, and surgical instruments.  I should note the superficial  radial nerve branches were protected at all times and throughout the case.  They were identified and routes always kept in mind.  The radial artery was  also identified and protected.  Once the trapezium was excised piecemeal  without difficulty completing the arthroplasty portion of the case, the  patient then had a drill hole made from dorsal to palmar exiting intra-  articularly in line with the palmar beak ligament.  This was enlarged to a  35 drill bit over a guide-wire.  Once this was done, I irrigated the wound  and then performed a tenolysis of the FCR tendon in the depths of the floor.  The tendon was tenolysed in preparation for the tendon transfer.  Following  this, the APL digastric portion was harvested through an incision  approximately 1 inch made 2 inches proximal to the CMC incision.  A 1/3  proper portion of the APL proper  was also harvested.  These, of course, were  left attached distally and pulled distally.  Once this was done, the  digastric portion of the APL was placed from dorsal to palmar through the  drill hole previously placed and then around the FCR and back through  itself.  This was then held in tension and checked.  I was pleased with  this.  Following this, the APL 1/3 proper portion was placed around the APL  2/3 proper portion and then around the FCR and back upon both structures in  multiple figure-of-eight throws.  This was done to my satisfaction without  difficulty.  These tendon transfers were then inset with 3-0 Ethibond  suture.  This provided excellent suspension and I was very pleased with  this.  Following this, I then anchovied the remaining tendon ends and placed  this in the space created by the tendon transfer.  Following this, the APL  underwent a tenodesis procedure to prevent dorsal lateral posturing of the  metacarpal.  This was tenodesed to my satisfaction and the joint looked  quite well.  Following this tenodesis with 3-0 Ethibond, I then repaired the  capsule.  The tourniquet was then deflated, hemostasis was obtained with  bipolar electrocautery as it was during the initial dissection.  The patient  then underwent closure of the wounds with interrupted Prolene.  She had  excellent refill, soft compartments, and no complicating features with the  surgery.  She was placed in a sterile dressing and a thumb spica splint with  the metacarpal in slight abduction and the MCP joint flexed.  She tolerated  this well and was taken to the recovery room where she will be monitored  overnight.  We will look forward to participating in her postoperative care  and I have discussed all findings with she and her family.  All questions  have been encouraged and answered.                                               Dionne Ano. Everlene Other, M.D.   Nash Mantis  D:  09/03/2003  T:   09/03/2003  Job:  [id number]

## 2011-04-21 NOTE — Discharge Summary (Signed)
[name], [name]             ACCOUNT NO.:  [id number]   MEDICAL RECORD NO.:  [id number]          PATIENT TYPE:  ORB   LOCATION:  4524                         FACILITY:  [facility]   PHYSICIAN:  Deirdre Peer. Polite, M.D. DATE OF BIRTH:  [birth year]   DATE OF ADMISSION:  11/08/2005  DATE OF DISCHARGE:  11/11/2005                                 DISCHARGE SUMMARY   DISCHARGE DIAGNOSES:  1.  Deconditioning and prolonged hospitalization.  2.  Recent pneumonia, bronchitis.  3.  Urinary tract infection.  4.  Hypothyroidism  5.  Hypercholesterolemia.  6.  Tach-brady syndrome, status post pacer.  7.  Depression.   DISCHARGE MEDICATIONS:  Include Coumadin, Betapace, Protonix, Synthroid,  allopurinol, potassium, Zocor, ferrous gluconate, Nasonex, Lasix 40 mg  b.i.d., cyclosporin eye drops b.i.d., p.r.n. Percocet.   DISPOSITION:  Patient discharged to home.   STUDIES:  None.   HOSPITAL COURSE:  Elderly female admitted to the [facility] after prolonged  hospitalization for tachy-brady syndrome and bronchitis. The patient was on  SACU from December 6th through December 9th. The patient progressed fairly  fast in her rehab care. It was felt that the patient was safe for discharge  to home on the 9th. The patient was discharged to home with services and her  family was there to assist with her discharge needs. The patient is to  follow up with Dr. Logan Bores and Dr. Tiburcio Pea, her primary MD, in approximately  one week. Again, the patient was discharged to home in stable condition.      Deirdre Peer. Polite, M.D.  Electronically Signed     RDP/MEDQ  D:  01/03/2006  T:  01/03/2006  Job:  [id number]

## 2011-04-21 NOTE — Op Note (Signed)
[name], [name]             ACCOUNT NO.:  [id number]   MEDICAL RECORD NO.:  [id number]          PATIENT TYPE:  INP   LOCATION:  5018                         FACILITY:  [facility]   PHYSICIAN:  Kerrin Champagne, M.D.   DATE OF BIRTH:  [birth year]   DATE OF PROCEDURE:  02/27/2005  DATE OF DISCHARGE:                                 OPERATIVE REPORT   PREOPERATIVE DIAGNOSIS:  Mild lumbar spinal stenosis L2-3, moderate severe  lumbar spinal stenosis L3-4 and L4-5, severe degenerative disc changes L1-2  to sacrum.   POSTOPERATIVE DIAGNOSIS:  Mild lumbar spinal stenosis L2-3, moderate severe  lumbar spinal stenosis L3-4 and L4-5, severe degenerative disc changes L1-2  to sacrum.   PROCEDURE:  Central decompressive laminectomy at L2-3, L3-4 and L4-5 with  left-sided foraminotomy at the L5 nerve root.   SURGEON:  Kerrin Champagne, M.D.   ASSISTANT:  Wende Neighbors, P.A.C.   ANESTHESIA:  GOT, Dr. Diamantina Monks.   ESTIMATED BLOOD LOSS:  200 mL.   DRAINS:  Foley to straight drain.  Hemovac x1.   BRIEF CLINICAL HISTORY:  This patient is a 77 year old female with a history  of increasing neurogenic claudication, pain into her hips and thighs with  ambulation.  Difficulty ambulating any length of time.  She is being  evaluated for consideration of joint replacement surgery of her lower  extremities, however, her pain is in distribution of both L4 to L5 nerve  roots both sides and she has undergone preoperative evaluation including  post myelogram CT scans demonstrating presence of lumbar spinal stenosis.  MRI study demonstrating stenosis which is most severe at L3-4 and L4-5,  mildly involving the L2-3 level.  She has changes consistent with  hypertrophic changes, ligamentum flavum medially effecting the lateral  recesses and the foramen at L2-3, L3-4 and L4-5.  She has severe  degenerative disc changes, hypolordosis occurring across her entire lumbar  segment.   INTRAOPERATIVE FINDINGS:   Rather significant severe central stenosis  secondary to hypertrophic ligamentum flavum changes L3-4 and L4-5.  Foraminal entrapment involving the L5 nerve roots, left side greater than  right, requiring foraminotomy of the left L5 nerve root.  Lateral recess  decompression at L4-5 foraminotomy over the 5 nerve root on the right side  providing adequate decompression on this side.   DESCRIPTION OF PROCEDURE:  After adequate general anesthesia, Foley catheter  placed.  Patient had preoperative antibiotics, Ancef.  She was turned to the  prone position and knee-chest position using Andrews frame to try to  diminish blood loss.   Patient underwent prep with DuraPrep solution from lower dorsal spine to the  mid sacral segments.  Draped in usual manner.  Iodine Vi-Drape was used.  The incision was made extending from the expected L2 spinous process to the  L5 spinous process, ellipsing the superior aspect of old incision scar used  to expose the L5-S1 level at previous surgeries.  Incision through skin and  subcutaneous layers after infiltration with Marcaine 0.5% with 1:200,000  epinephrine.  This was carried down to the lumbodorsal fascia in the  midline.  This is excised in midline, continued out laterally over the  expected spinous process of L4 and L3 and L2.  Clamps placed over the  expected spinous process of L3 and L4 and intraoperative lateral radiograph  demonstrated these clamps on the spinous processes of L3 and L4 as expected.  These were then marked for continued identification for the remainder of the  case.  Cobb elevator was then used to elevate the paralumbar muscles off the  lateral aspects of spinous process of L5, L4, L3 and L2 and off of the  posterior aspect of the interlaminar region and lamina of L2, L3, L4 and L5.  Archer Asa retract was inserted and bleeders controlled using bipolar  monopolar electrocautery.  Leksell rongeur then used to excise the spinous  process  of L4 and of L3 and the inferior 50% of the spinous process of L2  exposing the interlaminar region posteriorly at L2-3, L3-4 and L4-5.  Central portions of the lamina of L4 and L3 were then carefully thinned  using Leksell rongeur.  A 3 mm Kerrison then used to carefully remove bone  centrally performing a central trough extending from the interlaminar region  of L4-5 superiorly to the interlaminar space of L3-4 and continued  superiorly to the L2-3 level.  Next, osteotomes were used to perform medial  partial facetectomies involving the inferior articular process of L3  bilaterally, L4 bilaterally.  A 3 mm Kerrison then used to perform lateral  recess decompression extending from the superior aspect of L3 downwards to  L3-4 level excising ligamentum flavum that was hypertrophic in this segment  bilaterally and decompressing lateral recesses at the L3-4 level and  continuing downwards decompressing, removing bone from lateral recess  involving the lateral aspect of the thecal canal and the L4 lamina.  Partial  medial facetectomy involving the superior articular process of both the L5  and L4 was carried out decompressing the recess out to the level of the  pedicle of both L5 and L4 and of L3 superiorly. Ligamentum flavum at the L2-  3 level was similarly excised, decompressing this segment and decompressing  the lateral recesses where hypertrophic flavum was noted to be present.  Loupe magnification and head lamp was used during the initial phases of the  procedure.  Then the operating room microscope was draped sterilely, brought  into the field.  Under direct visualization, then, the L5 nerve roots  underwent decompression with foraminotomy excising bone over the area of the  foramen on the left side greater than right.  Using 3 mm Kerrison,  preserving the pars region at least 4 to 7 mm of bone prevent stress reinjuries at later time.  Superior portion of the lamina of L5 was resected   to further decompress central portion of the canal at this segment.  Both  lateral recesses at L4-5 were carefully decompressed and foraminotomy  performed over both L4 nerve roots, both L3 nerve roots and both L2 nerve  roots.  Such that a hockey stick neuroprobe could be passed out each of the  neural foramens L2-L5 on both left and right side indicating that  decompression had been carried out laterally sufficient to decompress the  neural foramen on both sides.  This completed, the disc space was  demonstrating no sign of disc protrusion either on right or left side.  Spinal canal was felt to be well decompressed and there was normal pulsation  of the CSF within the thecal sac.  No evidence  of dural tear was noted.  Bone wax applied to the bleeding cancellous bone surfaces both right and  left side.  Excess bone wax removed.  Thrombin soaked Gelfoam placed.  Irrigation performed.  The Gelfoam was then removed.  A medium Hemovac drain  placed in the depths of the incision exiting out the left lower lumbar  region.  Carefully then the paralumbar muscles were reapproximated at the  midline loosely with #1 Vicryl sutures.  The lumbodorsal fascia approximated  in the midline with interrupted #1 Vicryl sutures in simple and figure-of-  eight fashion.  Deep subcutaneous layers approximated with interrupted 0  Vicryl sutures, more superficial layers with interrupted 2-0 Vicryl sutures  and the skin was then closed with a running subcutaneous stitch of 4-0  Vicryl.  Tincture of Benzoin and Steri-Strips applied.  The 4x4s and ABD pad  fixed to the skin with Hypafix tape.  Patient was then returned to supine  position, reactivated, extubated and returned to the recovery room in  satisfactory condition.  All sponge and instrument counts were correct.     JEN/MEDQ  D:  02/28/2005  T:  02/28/2005  Job:  [id number]

## 2011-04-21 NOTE — H&P (Signed)
[name], [name]             ACCOUNT NO.:  [id number]   MEDICAL RECORD NO.:  [id number]          PATIENT TYPE:  INP   LOCATION:  5003                         FACILITY:  [facility]   PHYSICIAN:  Almedia Balls. Ranell Patrick, M.D. DATE OF BIRTH:  01-23-1931   DATE OF ADMISSION:  05/02/2007  DATE OF DISCHARGE:                              HISTORY & PHYSICAL   CHIEF COMPLAINT:  Left upper extremity redness and drainage from left  shoulder wound.   HISTORY OF PRESENT ILLNESS:  The patient is a 77 year old female with a  history of left shoulder hemiarthroplasty on Apr 11, 2007.  The patient  reported a couple-day history of increasing drainage and redness coming  from her wound.  The patient was seen yesterday in the outpatient  orthopedic clinic and noted to have findings consistent with a potential  surgical wound infection.  She presents now for operative I&D and  cultures.   PAST MEDICAL HISTORY:  1. Atrial fibrillation, status post pacemaker placement 2006.  2. Hypertension.  3. Dyslipidemia.  4. Hypothyroidism.  5. Gastroesophageal reflux disease.  6. Anemia.  7. Renal insufficiency.  8. Gout and pseudogout.  9. Colon polyps.  10.Urinary tract infections with sepsis.  11.Appendectomy.  12.Hysterectomy.  13.Mastoidectomy.   ALLERGIES:  SULFA.   CURRENT MEDICATIONS:  See MAR.   PHYSICAL EXAMINATION:  GENERAL:  The patient is a healthy appearing  female in no acute distress.  She is alert and oriented.  LEFT UPPER EXTREMITY:  Examination of the left shoulder reveals a well-  healed surgical incision with the exception of the bottom centimeter,  which reveals a small opening and some drainage and erythema.  The  patient has some tenderness about the incision and a little bit of  fluctuance.  There is no distal streaking on her arm.  Distally, she is  neurologically intact.  Range of motion of the shoulder is smooth and  painless.  RIGHT UPPER EXTREMITY:  Right upper extremity is  pain-free with full  range of motion.  No erythema.  Lower extremity range of motion:  Normal.  Distal perfusion is excellent.  Radiographs of the shoulder are  unremarkable other than for hemiarthroplasty.   IMPRESSION:  Left shoulder pain and erythema, likely infection, status  post hemiarthroplasty for shoulder arthritis.   PLAN:  The patient is admitted now for operative incision and drainage  and cultures, and we will obtain infectious disease consult postop.      Almedia Balls. Ranell Patrick, M.D.  Electronically Signed     SRN/MEDQ  D:  06/09/2007  T:  06/09/2007  Job:  [id number]

## 2011-04-21 NOTE — Consult Note (Signed)
[name], [name]             ACCOUNT NO.:  [id number]   MEDICAL RECORD NO.:  [id number]          PATIENT TYPE:  INP   LOCATION:  0162                         FACILITY:  [facility]   PHYSICIAN:  Vesta Mixer, M.D. DATE OF BIRTH:  08-30-1931   DATE OF CONSULTATION:  10/29/2005  DATE OF DISCHARGE:                                   CONSULTATION   [name] a 77 year old female who was admitted to the hospital  earlier today with episodes of chest pain and headache.  She was ruled out  for myocardial infarction.   She originally was in sinus rhythm, but today she apparently developed  atrial fibrillation.  She developed several pauses of two and three seconds  and we were asked to see her for further evaluation.  She was transferred to  the intensive care unit.  Along the way she actually became tachycardic.   The patient remains fairly asymptomatic from a cardiac standpoint.  She  still has her headache and still has occasional episodes of chest pains.  She denies any syncope or presyncope.  She denies any angina or shortness of  breath.   CURRENT MEDICATIONS:  1.  Metoprolol 50 mg p.o. b.i.d.  2.  Synthroid 0.025 mg a day.  3.  Fioricet as needed.  4.  Lasix 40 mg p.o. b.i.d.   PAST MEDICAL HISTORY:  1.  Hypertension.  2.  History of chest pain.  3.  Spinal surgery.  4.  History of atrial fibrillation.  5.  History of previous myocardial infarction.   SOCIAL HISTORY:  The patient has a remote history of smoking.   PHYSICAL EXAMINATION:  GENERAL:  [name] an elderly female in no acute  distress.  [name] alert and oriented x3 and her mood and affect are normal.  VITAL SIGNS:  Her heart rate Whitehead 140.  She has intermittent pauses of two to  three seconds.  HEENT:  2+ carotids.  She has no bruits.  There Whitehead no JVD.  LUNGS:  Clear to auscultation.  HEART:  Irregularly irregular and quite tachycardic.  ABDOMEN:  Good bowel sounds and nontender.  EXTREMITIES:  No  edema.  NEUROLOGIC:  Nonfocal.   Her atrial fibrillation reveals atrial fibrillation with a rapid ventricular  response.  She has occasional pauses earlier in the day.  EKG Whitehead pending.   LABORATORY DATA:  Potassium 3.7 earlier today.  Her current potassium Whitehead 3.  Her BUN Whitehead 25, creatinine Whitehead 1.7.   IMPRESSION AND PLAN:  Sick sinus syndrome.  The patient clearly has sick  sinus syndrome.  She has episodes of tachycardia as well as episodes of  bradycardia.  She has received metoprolol today, but also has received  Fioricet which contains caffeine.  I suspect she will need a pacemaker for  optimal control of her episodes of tachycardia without her becoming too  bradycardic.   Because [name] so tachycardic now we have started her on a very low dose of  intravenous Cardizem and will watch her very closely.  My hope Whitehead that we  will be able to  get her heart rate from the 140 range down to the 100 or 110  range.  She will probably tolerate this much better.  We will need to watch  very closely for any pauses.  If she develops too many pauses we will have  to discontinue the Cardizem drip all together.   I have added her on the add-on board for a pacemaker for tomorrow by Dr.  Amil Amen.  Hopefully, we will be able to get a pacemaker placed in the next  several days.           ______________________________  Vesta Mixer, M.D.     PJN/MEDQ  D:  10/29/2005  T:  10/30/2005  Job:  [id number]   cc:   Holley Bouche, M.D.  Fax: [phone]   Francisca December, M.D.  Fax: [phone]   Melissa L. Ladona Ridgel, MD

## 2011-04-21 NOTE — Op Note (Signed)
[name], [name]             ACCOUNT NO.:  [id number]   MEDICAL RECORD NO.:  [id number]          PATIENT TYPE:  AMB   LOCATION:  ENDO                         FACILITY:  [facility]   PHYSICIAN:  Bernette Redbird, M.D.   DATE OF BIRTH:  31-Oct-1931   DATE OF PROCEDURE:  03/21/2007  DATE OF DISCHARGE:                               OPERATIVE REPORT   PROCEDURE:  Colonoscopy with biopsy.   INDICATION:  77 year old female for follow up of prior colonic adenomas,  last colonoscoped about 3 1/2 years ago.   FINDINGS:  Several diminutive polyps removed.   PROCEDURE:  The nature, purpose, and risks of the procedure are familiar  to the patient from prior examination.  She provided written consent.  Sedation for this procedure and the upper endoscopy which preceded it  totaled fentanyl 150 mcg and Versed 16 mg IV without arrhythmias,  desaturation, or deep sedation.  A large amount of medication tolerance  is attributed to the patient's outpatient use of Cymbalta.   The Pentax adult video colonoscope was advanced with moderate looping  around the colon to the cecum, using external abdominal compression to  control looping.  The cecum was identified by clear visualization of the  appendiceal orifice as well as the ileocecal valve and pullback was then  performed.   On the floor of the cecum were two tiny polyps, each about 2 mm across,  removed by cold biopsy.  The remainder of the colon was pertinent for  some scattered diverticulosis and some very small flat sessile  hyperplastic appearing rectal polyps which were cold biopsied, as well.  Retroflexion in the rectum showed moderate internal hemorrhoids.  Reinspection of the rectum was, otherwise, unremarkable.  The patient  tolerated the procedure well and there were no apparent complications.  No large polyps, cancer, colitis or vascular ectasia were noted.   IMPRESSION:  1. Multiple diminutive polyps removed (211.3).  2. Prior history  of colonic adenoma.  3. Mild diverticulosis.   PLAN:  Await pathology results.  However, in view of the patient's  advanced age and the absence of any advanced appearing lesions on  today's exam, it is unlikely that further colonoscopic evaluation will  be needed.           ______________________________  Bernette Redbird, M.D.     RB/MEDQ  D:  03/21/2007  T:  03/21/2007  Job:  [id number]   cc:   Holley Bouche, M.D.

## 2011-04-21 NOTE — Discharge Summary (Signed)
[name], Whitehead             ACCOUNT NO.:  [id number]   MEDICAL RECORD NO.:  [id number]          PATIENT TYPE:  INP   LOCATION:  5003                         FACILITY:  [facility]   PHYSICIAN:  Almedia Balls. Ranell Patrick, M.D. DATE OF BIRTH:  Jul 19, 1931   DATE OF ADMISSION:  05/02/2007  DATE OF DISCHARGE:  05/06/2007                               DISCHARGE SUMMARY   ADMISSION DIAGNOSIS:  Left shoulder infection.   DISCHARGE DIAGNOSIS:  Left shoulder infection.   BRIEF HISTORY:  Patient had a recent left should hemiarthroplasty 3  weeks ago.  Patient presented with a local wound infection and was  admitted for I&D and assessment under anesthesia of this wound.   PROCEDURE:  The patient had a left shoulder I&D and placement of a  Jackson-Pratt drain by Dr. Malon Kindle on May 02, 2007.  No assistant  was present.  Estimated blood loss was minimal and no complications.   HOSPITAL COURSE:  Patient was admitted on May 02, 2007 for the above-  stated procedure, which she tolerated well.  After adequate time in the  postanesthesia care unit, she was transferred up to 5000.  Postop day 1,  patient complaining of some moderate pain to the left shoulder.  We are  awaiting cultures from the lab in regards to her left shoulder.  We also  had Dr. Orvan Falconer from infectious disease consult on [name] after 3  days of no growth from her wound.  Dr. Orvan Falconer did recommend Avelox  p.o. for the next 2-3 weeks depending on wound healing to ensure no  further infection would develop.  Patient was amenable to stay in the  hospital and working with physical therapy in the hospital for her left  arm.  Her incision was healing well throughout the entire time.  No  signs of cellulitis, erythema or infection.  Neurovascularly, she was  intact.  She stayed in the sling when need be, but otherwise she  restarted her arm on a pillow.  No signs of edema or blood clots.   DISCHARGE PLAN:  Patient will be discharged  home on May 06, 2007.   CONDITION ON DISCHARGE:  Stable.   DIET:  Regular.   ALLERGIES:  PATIENT HAS ALLERGIES TO:  1. SULFA.  2. DEMEROL.  3. VIOXX.   DISCHARGE MEDICATIONS:  1. Avelox 400 mg 1 tab p.o. daily for 3 weeks.  2. Vicodin 5 mg 1-2 tabs q.6 hours p.r.n. pain.  3. Robaxin 500 mg p.o. q.6 hours p.r.n. spasm.  4. Allopurinol 100 mg p.o. b.i.d.  5. Restasis eye drops 1 drop b.i.d. each eye.  6. Cymbalta 60 mg p.o. daily.  7. Lasix 40 mg p.o. b.i.d.  8. Levothyroxine 25 mcg p.o. daily.  9. Prednisolone opthalmic drops 2 drops each eye q.i.d.  10.Zocor 40 mg p.o. q.h.s.  11.Sotalol 80 mg p.o. b.i.d.  12.Detrol 4 mg p.o. q.h.s.   FOLLOWUP:  Patient will follow back up with Dr. Malon Kindle in 7-10  days.      Thomas B. Durwin Nora, P.A.      Almedia Balls. Ranell Patrick, M.D.  Electronically  Signed    TBD/MEDQ  D:  05/07/2007  T:  05/07/2007  Job:  [id number]

## 2011-04-21 NOTE — Discharge Summary (Signed)
[facility]  Patient:    [name], [name] Visit Number: [id number] MRN: [id number]          Service Type: MED Location: 3W [id number] Attending Physician:  Rosanne Sack Dictated by:   Rosanne Sack, M.D. Admit Date:  04/05/2002 Discharge Date: 04/12/2002   CC:         Francisca December, M.D., [facility]  Arvella Merles, M.D., [facility]   Discharge Summary  DATE OF BIRTH:  [birth year]  DISCHARGE DIAGNOSES:  1. Severe bilateral pyelonephritis with bacteremia and sepsis.  2. Congestive heart failure secondary to fluid overload.     a. A 2-D echocardiogram pending.     b. TSH 3.233.     c. A stress Cardiolite as an outpatient to be performed by Dr. Corliss Marcus.     d. History of chronic Q waves in precordial leads without history of        cardiac problems.  3. Acute-on-chronic renal insufficiency.  Discharge creatinine 1.4, GFR 43 cc     per minute.  4. Thrush secondary to problem #1.  5. Intractable headaches, resolved.  6. Hypertension.  7. History of hyperlipidemia.  8. Normocytic anemia.  Hemoglobin 11.0, MCV 92.  9. History of abdominal pain.  Negative esophagogastroduodenoscopy and     colonoscopy by Dr. Emogene Morgan in the [facility] health care. 10. Status post bilateral total knee replacement. 11. Status post appendectomy. 12. Status post hysterectomy. 13. Status post mastoidectomy. 14. Status post lumbar laminectomy in the past. 15. History of skin cancer removal in the past. 16. Allergies to sulfa drugs and Demerol.  DISCHARGE MEDICATIONS:  1. Tequin 400 mg p.o. q.d. x 7 more days to complete a total of two weeks of     therapy.  2. Lopressor 50 mg p.o. b.i.d.  3. Enteric-coated aspirin 325 mg p.o. q.d.  4. Lotensin 10 mg p.o. q.d.  5. Prevacid 30 mg p.o. q.d.  6. Prozac 20 mg p.o. q.d.  7. Detrol LA 4 mg p.o. q.d.  8. Mevacor 40 mg p.o. q.d.  9. Celebrex 200 mg p.o. q.d. 10.  Nitroglycerin 0.4 mg sublingual p.r.n. 11. Lasix 40 mg p.o. q.d. 12. Premarin 1.25 mg p.o. q.d. 13. Mycelex troches t.i.d. x ______ more days.  DISCHARGE FOLLOW-UP AND DISPOSITION:  [name] will be followed by Dr. Corliss Marcus, [facility], within seven to 10 day for a stress Cardiolite to be performed as an outpatient.  Dr. Leonides Sake will follow the patient within three to five days in [facility] from a general medicine standpoint.  We recommend to follow the patients renal function as a degree of chronic renal insufficiency suspected.  Patient was admitted to the hospital with a creatinine of 2.6.  At the time of discharge, the creatinine was 1.4 which is a GFR of 43 cc per minute.  In the meantime, the patient will avoid heavy exercise.  She was instructed to call 911 if chest pain were to occur.  A 2-D echocardiogram is currently pending and Dr. Corliss Marcus will be reviewing this test and follow this patient as described above.  The reason for this 2-D echocardiogram was the presence of pulmonary edema after aggressive fluid resuscitation due to sepsis in the setting of Q waves in the EKG in precordial leads.  CONSULTATIONS:  None.  PROCEDURES:  1. Two-dimensional echocardiogram, pending results.  2. CT scan of the  abdomen and pelvis consistent with bilateral pyelonphritis.     Also, there was a mild intra and extrabiliary ductal dilation of unknown     etiology.  No ampullar masses.  DISCHARGE LAB DATA:  Blood culture positive for E. coli sensitive to all antibiotics tested.  Urinalysis consistent with gross pyruria on admission.  A TSH was 3.233.  Sodium 138, potassium 4.3, chloride 105, CO2 27, BUN 15, creatinine 1.4, glucose 108.  LFTs within normal limits.  Guaiac-negative stool.  Hemoglobin 11.0, MCV 92, WBC 5.4, platelets 214.  Urine culture negative.  C. difficile toxin assay negative.  HOSPITAL COURSE:  [name] is a very pleasant  77 year old female admitted to [facility] on Apr 05, 2002, with hypertension and pyelonephritis. Please see admission history and physical by Dr. Marcelino Duster for further details regarding the history of presentation, physical exam, and lab data.  PROBLEM LIST: 1 - SEVERE BILATERAL ESCHERICHIA COLI PYELONEPHRITIS WITH BACTEREMIA AND SEPSIS:  The patient was admitted to the hospital with fever, leukocytosis, gross pyuria, and hypertension.  The clinical diagnosis of pyelonephritis was very likely.  Because of the diffuse flank tenderness and questionable right upper quadrant tenderness, a CAT scan of the abdomen and pelvis was obtained. These imaging studies showed the bilateral pyelonephritis.  Also, some mild intra and extrahepatic ductal dilation was noticed.  No masses, no stones were found in this imaging study.  Clinically, the patient did not have any right upper quadrant tenderness.  There was clearly a right flank tenderness that the patient was complaining about.  Blood cultures were obtained in the emergency department.  Intravenous antibiotic therapy was started. Clinically, the patient started to improve on this therapy.  The blood culture isolated E. coli.  Unfortunately, the urine culture was obtained after the intravenous antibiotic therapy was started.  Due to the recurrent hypertension due to sepsis, the patient required aggressive fluid resuscitation.  When I took over the care of this patient, on Apr 08, 2002, a clear sense of CHF was noticed.  A chest x-ray confirmed this suspicion.  The IV fluids were discontinued and the patient was given Lasix to help with diuresis.  Please see problem #2 regarding the details of this later problem.  Throughout this hospital stay, the patient showed steady signs of improvement  with intravenous Tequin.  The patient required a prolonged hospital stay due to the concurrent complications mentioned above like CHF, acute renal  failure, and severe septicemia.  No evidence of endocarditis were noticed.  A 2-D echocardiogram obtained prior to discharge was pending results on the day of discharge.  Tequin was switched to tablets without complications.  The patient was discharged in stable condition.  A mild episode of soft stool was reported by [name].  The stools were obtained to rule out C. difficile colitis though this test was negative.  By the time of discharge, the patient was ______ and hemodynamically stable.  We will plan to treat this patient with a total of two weeks of antibiotic therapy.  #2 - CONGESTIVE HEART FAILURE:  As described above, pulmonary edema was noticed on Apr 08, 2002.  It was felt that the aggressive fluid resuscitation was because of this problem.  Also, the probable underlying chronic renal insufficiency is likely to be the cause contributing to this problem.  The patient reported a long history of abnormal EKG when she had previous surgeries.  The patient was able also to describe that this EKG showed perhaps old  heart attacks.  [name] denied any previous history of MI, CHF, or anginal symptoms.  The TSH was normal.  The patient was placed on diuretic agents along with ACE inhibitors without complications.  A 2-D echocardiogram was obtained prior to discharge which results are pending.  As described previously, Dr. Corliss Marcus will follow this patient closely in his office. A stress Cardiolite is likely to be performed in the near future to rule out significant coronary artery disease.  In the meantime, the patient will be on aspirin, ACE inhibitor, beta blocker, and nitrates as needed along with Lasix. At the time of discharge, there was no evidence of pulmonary edema.  The follow-up chest x-ray showed ______ resolution of the CHF.  #3 - ACUTE RENAL FAILURE ON PROBABLE CHRONIC RENAL INSUFFICIENCY:  On admission, the patients creatinine was 2.6.  It is likely that this  acute renal insufficiency was multifactorial.  Sepsis with hypertension, pyelonephritis, the concurrent diuretic agent are among other possible etiologies.  With supportive care, the patients renal function improved.  At the time of discharge, the patients creatinine was 1.4 which was a GFR of 43 cc per minute.  Follow-up of this problem will be provided by Dr. Holley Bouche.  I spent about 25 minutes on the discharge process of this patient.  Medical codnition at the time of discharge has improved. Dictated by:   Rosanne Sack, M.D. Attending Physician:  Rosanne Sack DD:  04/12/02 TD:  04/15/02 Job: [id number]

## 2011-04-21 NOTE — H&P (Signed)
[name], [name]             ACCOUNT NO.:  [id number]   MEDICAL RECORD NO.:  [id number]          PATIENT TYPE:  ORB   LOCATION:  4524                         FACILITY:  [facility]   PHYSICIAN:  Deirdre Peer. Polite, M.D. DATE OF BIRTH:  1931-08-14   DATE OF ADMISSION:  11/08/2005  DATE OF DISCHARGE:                                HISTORY & PHYSICAL   CHIEF COMPLAINT:  Deconditioning.   HISTORY OF PRESENT ILLNESS:  A 77 year old female with history of  hypothyroidism, high cholesterol, gout, hypertension, history of spinal  surgery, who was admitted to the hospital on October 29, 2005, with chest  pain. The patient was ultimately found to have tachybrady syndrome which  required a pacer. The patient's hospitalization was complicated by  pneumonia, bronchitis, and possible UTI; however, final culture was  negative. Post completion of treatment in the hospital, the patient was  deconditioned and weak and required further stay in a subacute unit.  Therefore, the patient was transferred to [facility]. Please see admission H&P and  discharge H&P from acute stay hospital for further details.   PAST MEDICAL HISTORY:  As stated above.   MEDICATIONS:  Currently include:  1.  Coumadin which is being managed by pharmacy.  2.  Betapace 80 mg b.i.d.  3.  Protonix 40 mg bi d  4.  Synthroid 25 mcg daily.  5.  Allopurinol 200 mg daily.  6.  Potassium 40 mEq daily.  7.  Zocor 40 mg daily.  8.  Ferrous gluconate 325 daily.  9.  Nasonex two sprays daily.  10. Lasix 40 mg b.i.d.  11. Cyclosporin eyedrops b.i.d.  12. P.r.n. Percocet.  13. P.r.n. Tylenol.   SOCIAL HISTORY:  The patient was a remote smoker.   PAST SURGICAL HISTORY:  Significant for knee replacement, two back  surgeries, left shoulder surgery.   REPORTED ALLERGIES:  Include SULFA and DEMEROL.   FAMILY HISTORY:  Noncontributory.   PHYSICAL EXAMINATION:  GENERAL:  The patient is alert and oriented x3, no  apparent distress.  VITAL  SIGNS:  Stable, afebrile.  HEENT:  Within normal limits.  CHEST:  Few basilar crackles.  CARDIOVASCULAR:  Regular, no S3.  ABDOMEN:  Soft, nontender.  EXTREMITIES:  Trace edema.  NEUROLOGIC:  Nonfocal.   LABORATORY DATA:  Pending at the time of this dictation.   ASSESSMENT:  1.  Deconditioning. Will require treatment per [facility] protocol.  2.  Hypertension.  3.  Hypothyroidism.  4.  Hypercholesterolemia.  5.  Tachybrady syndrome status post pacer.  6.  Depression.   The patient will continue outpatient medications as listed in the medication  section. Will have her Coumadin monitored by pharmacy and will make further  recommendations as deemed necessary.      Deirdre Peer. Polite, M.D.  Electronically Signed     RDP/MEDQ  D:  11/09/2005  T:  11/09/2005  Job:  [id number]

## 2011-08-31 LAB — COMPREHENSIVE METABOLIC PANEL
Alkaline Phosphatase: 97
BUN: 19
Chloride: 101
GFR calc non Af Amer: 46 — ABNORMAL LOW
Glucose, Bld: 96
Potassium: 3.6
Total Bilirubin: 0.7

## 2011-08-31 LAB — DIFFERENTIAL
Basophils Relative: 1
Eosinophils Absolute: 0.4
Neutrophils Relative %: 60

## 2011-08-31 LAB — CBC
Hemoglobin: 12.3
MCHC: 33.5
RBC: 3.68 — ABNORMAL LOW

## 2011-09-21 LAB — BASIC METABOLIC PANEL
GFR calc non Af Amer: 60 — ABNORMAL LOW
Potassium: 3.8
Sodium: 139

## 2012-04-02 ENCOUNTER — Other Ambulatory Visit: Payer: Self-pay | Admitting: Dermatology

## 2012-05-26 ENCOUNTER — Other Ambulatory Visit: Payer: Self-pay

## 2012-06-11 ENCOUNTER — Inpatient Hospital Stay (HOSPITAL_COMMUNITY)
Admission: EM | Admit: 2012-06-11 | Discharge: 2012-06-13 | DRG: 069 | Disposition: A | Discharge status: Home Health | Payer: Medicare Other | Source: Ambulatory Visit | Attending: Internal Medicine | Admitting: Internal Medicine

## 2012-06-11 DIAGNOSIS — Z79899 Other long term (current) drug therapy: Secondary | ICD-10-CM

## 2012-06-11 DIAGNOSIS — Z87891 Personal history of nicotine dependence: Secondary | ICD-10-CM

## 2012-06-11 DIAGNOSIS — Z7982 Long term (current) use of aspirin: Secondary | ICD-10-CM

## 2012-06-11 DIAGNOSIS — R3129 Other microscopic hematuria: Secondary | ICD-10-CM

## 2012-06-11 DIAGNOSIS — G459 Transient cerebral ischemic attack, unspecified: Secondary | ICD-10-CM

## 2012-06-11 DIAGNOSIS — I10 Essential (primary) hypertension: Secondary | ICD-10-CM

## 2012-06-11 DIAGNOSIS — Z950 Presence of cardiac pacemaker: Secondary | ICD-10-CM

## 2012-06-11 DIAGNOSIS — I4891 Unspecified atrial fibrillation: Secondary | ICD-10-CM | POA: Diagnosis present

## 2012-06-11 DIAGNOSIS — K219 Gastro-esophageal reflux disease without esophagitis: Secondary | ICD-10-CM | POA: Diagnosis present

## 2012-06-11 DIAGNOSIS — E785 Hyperlipidemia, unspecified: Secondary | ICD-10-CM

## 2012-06-11 DIAGNOSIS — Z96659 Presence of unspecified artificial knee joint: Secondary | ICD-10-CM

## 2012-06-11 DIAGNOSIS — D509 Iron deficiency anemia, unspecified: Secondary | ICD-10-CM | POA: Diagnosis present

## 2012-06-11 DIAGNOSIS — D649 Anemia, unspecified: Secondary | ICD-10-CM

## 2012-06-11 HISTORY — DX: Essential (primary) hypertension: I10

## 2012-06-11 HISTORY — DX: Unspecified osteoarthritis, unspecified site: M19.90

## 2012-06-11 HISTORY — DX: Dysphagia, unspecified: R13.10

## 2012-06-11 HISTORY — DX: Chronic sinusitis, unspecified: J32.9

## 2012-06-11 HISTORY — DX: Unspecified atrial fibrillation: I48.91

## 2012-06-11 HISTORY — DX: Gastro-esophageal reflux disease without esophagitis: K21.9

## 2012-06-11 HISTORY — DX: Hyperlipidemia, unspecified: E78.5

## 2012-06-11 HISTORY — DX: Urinary tract infection, site not specified: N39.0

## 2012-06-11 LAB — DIFFERENTIAL
Basophils Absolute: 0 10*3/uL (ref 0.0–0.1)
Lymphocytes Relative: 36 % (ref 12–46)
Lymphs Abs: 2.4 10*3/uL (ref 0.7–4.0)
Monocytes Absolute: 0.6 10*3/uL (ref 0.1–1.0)
Neutro Abs: 3.1 10*3/uL (ref 1.7–7.7)

## 2012-06-11 LAB — APTT: aPTT: 30 seconds (ref 24–37)

## 2012-06-11 LAB — CBC
HCT: 31.3 % — ABNORMAL LOW (ref 36.0–46.0)
Hemoglobin: 9.9 g/dL — ABNORMAL LOW (ref 12.0–15.0)
RBC: 3.55 MIL/uL — ABNORMAL LOW (ref 3.87–5.11)
RDW: 16.5 % — ABNORMAL HIGH (ref 11.5–15.5)
WBC: 6.7 10*3/uL (ref 4.0–10.5)

## 2012-06-11 NOTE — ED Notes (Signed)
Last seen normal 06/10/12 ~ 0100. No hx. Of stroke. Pt. Woke up with mumbled speech but resolved on its own. pcp wanted pt. To come here b/c of mumbled speech. bp high on arrival. Generalized h/a all over. No sensory deficits.

## 2012-06-12 ENCOUNTER — Inpatient Hospital Stay (HOSPITAL_COMMUNITY): Payer: Medicare Other

## 2012-06-12 ENCOUNTER — Emergency Department (HOSPITAL_COMMUNITY): Payer: Medicare Other

## 2012-06-12 ENCOUNTER — Encounter (HOSPITAL_COMMUNITY): Payer: Self-pay | Admitting: Radiology

## 2012-06-12 DIAGNOSIS — I059 Rheumatic mitral valve disease, unspecified: Secondary | ICD-10-CM

## 2012-06-12 DIAGNOSIS — E785 Hyperlipidemia, unspecified: Secondary | ICD-10-CM

## 2012-06-12 DIAGNOSIS — I10 Essential (primary) hypertension: Secondary | ICD-10-CM

## 2012-06-12 DIAGNOSIS — D649 Anemia, unspecified: Secondary | ICD-10-CM

## 2012-06-12 DIAGNOSIS — G459 Transient cerebral ischemic attack, unspecified: Secondary | ICD-10-CM

## 2012-06-12 DIAGNOSIS — R3129 Other microscopic hematuria: Secondary | ICD-10-CM

## 2012-06-12 LAB — URINALYSIS, ROUTINE W REFLEX MICROSCOPIC
Glucose, UA: NEGATIVE mg/dL
Hgb urine dipstick: NEGATIVE
Ketones, ur: NEGATIVE mg/dL
Protein, ur: 30 mg/dL — AB

## 2012-06-12 LAB — FERRITIN: Ferritin: 12 ng/mL (ref 10–291)

## 2012-06-12 LAB — COMPREHENSIVE METABOLIC PANEL
ALT: 13 U/L (ref 0–35)
AST: 29 U/L (ref 0–37)
CO2: 26 mEq/L (ref 19–32)
Chloride: 105 mEq/L (ref 96–112)
Creatinine, Ser: 1.02 mg/dL (ref 0.50–1.10)
GFR calc non Af Amer: 51 mL/min — ABNORMAL LOW (ref >90)
Total Bilirubin: 0.2 mg/dL — ABNORMAL LOW (ref 0.3–1.2)

## 2012-06-12 LAB — LIPID PANEL: LDL Cholesterol: 91 mg/dL (ref 0–99)

## 2012-06-12 LAB — IRON AND TIBC
Iron: 28 ug/dL — ABNORMAL LOW (ref 42–135)
Saturation Ratios: 9 % — ABNORMAL LOW (ref 20–55)
UIBC: 286 ug/dL (ref 125–400)

## 2012-06-12 LAB — TSH: TSH: 2.459 u[IU]/mL (ref 0.350–4.500)

## 2012-06-12 LAB — HEMOGLOBIN A1C
Hgb A1c MFr Bld: 5.8 % — ABNORMAL HIGH (ref <5.7)
Mean Plasma Glucose: 120 mg/dL — ABNORMAL HIGH (ref <117)

## 2012-06-12 LAB — URINE MICROSCOPIC-ADD ON

## 2012-06-12 MED ORDER — CALCITRIOL 0.25 MCG PO CAPS
0.2500 ug | ORAL_CAPSULE | Freq: Every day | ORAL | Status: DC
  Administered 2012-06-12 – 2012-06-13 (×2): 0.25 ug via ORAL
  Filled 2012-06-12 (×2): qty 1

## 2012-06-12 MED ORDER — ADULT MULTIVITAMIN W/MINERALS CH
1.0000 | ORAL_TABLET | Freq: Every day | ORAL | Status: DC
  Administered 2012-06-12 – 2012-06-13 (×2): 1 via ORAL
  Filled 2012-06-12 (×2): qty 1

## 2012-06-12 MED ORDER — ASPIRIN 81 MG PO CHEW: 324.0000 mg | CHEWABLE_TABLET | Freq: Once | ORAL | Status: DC

## 2012-06-12 MED ORDER — FLUTICASONE PROPIONATE 50 MCG/ACT NA SUSP
2.0000 | Freq: Every day | NASAL | Status: DC
  Administered 2012-06-13: 2 via NASAL
  Filled 2012-06-12: qty 16

## 2012-06-12 MED ORDER — CLOPIDOGREL BISULFATE 75 MG PO TABS: 75.0000 mg | ORAL_TABLET | Freq: Every day | ORAL | Status: DC

## 2012-06-12 MED ORDER — IOHEXOL 350 MG/ML SOLN
50.0000 mL | Freq: Once | INTRAVENOUS | Status: AC | PRN
  Administered 2012-06-12: 50 mL via INTRAVENOUS

## 2012-06-12 MED ORDER — ALLOPURINOL 100 MG PO TABS
100.0000 mg | ORAL_TABLET | Freq: Two times a day (BID) | ORAL | Status: DC
  Administered 2012-06-12 – 2012-06-13 (×3): 100 mg via ORAL
  Filled 2012-06-12 (×5): qty 1

## 2012-06-12 MED ORDER — FUROSEMIDE 40 MG PO TABS
40.0000 mg | ORAL_TABLET | Freq: Every day | ORAL | Status: DC
  Administered 2012-06-12: 40 mg via ORAL
  Filled 2012-06-12 (×2): qty 1

## 2012-06-12 MED ORDER — OMEGA-3-ACID ETHYL ESTERS 1 G PO CAPS
2.0000 g | ORAL_CAPSULE | Freq: Every day | ORAL | Status: DC
  Administered 2012-06-12: 2 g via ORAL
  Filled 2012-06-12 (×3): qty 2

## 2012-06-12 MED ORDER — FESOTERODINE FUMARATE ER 4 MG PO TB24
4.0000 mg | ORAL_TABLET | Freq: Every day | ORAL | Status: DC
  Administered 2012-06-12 – 2012-06-13 (×2): 4 mg via ORAL
  Filled 2012-06-12 (×2): qty 1

## 2012-06-12 MED ORDER — CLOPIDOGREL BISULFATE 75 MG PO TABS
75.0000 mg | ORAL_TABLET | Freq: Every day | ORAL | Status: DC
  Administered 2012-06-12 – 2012-06-13 (×2): 75 mg via ORAL
  Filled 2012-06-12 (×2): qty 1

## 2012-06-12 MED ORDER — POTASSIUM CHLORIDE CRYS ER 20 MEQ PO TBCR
20.0000 meq | EXTENDED_RELEASE_TABLET | Freq: Two times a day (BID) | ORAL | Status: DC
  Administered 2012-06-12 – 2012-06-13 (×3): 20 meq via ORAL
  Filled 2012-06-12 (×4): qty 1

## 2012-06-12 MED ORDER — DM-GUAIFENESIN ER 30-600 MG PO TB12
1.0000 | ORAL_TABLET | Freq: Two times a day (BID) | ORAL | Status: DC
  Administered 2012-06-12 – 2012-06-13 (×3): 1 via ORAL
  Filled 2012-06-12 (×4): qty 1

## 2012-06-12 MED ORDER — LEVOTHYROXINE SODIUM 25 MCG PO TABS
25.0000 ug | ORAL_TABLET | Freq: Every day | ORAL | Status: DC
  Administered 2012-06-13: 25 ug via ORAL
  Filled 2012-06-12 (×3): qty 1

## 2012-06-12 MED ORDER — HYDRALAZINE HCL 20 MG/ML IJ SOLN
10.0000 mg | Freq: Four times a day (QID) | INTRAMUSCULAR | Status: DC | PRN
  Filled 2012-06-12: qty 0.5

## 2012-06-12 MED ORDER — SIMVASTATIN 40 MG PO TABS
40.0000 mg | ORAL_TABLET | Freq: Every evening | ORAL | Status: DC
  Administered 2012-06-12 – 2012-06-13 (×2): 40 mg via ORAL
  Filled 2012-06-12 (×2): qty 1

## 2012-06-12 MED ORDER — SENNOSIDES-DOCUSATE SODIUM 8.6-50 MG PO TABS: 1.0000 | ORAL_TABLET | Freq: Every evening | ORAL | Status: DC | PRN

## 2012-06-12 MED ORDER — DULOXETINE HCL 60 MG PO CPEP
60.0000 mg | ORAL_CAPSULE | Freq: Every day | ORAL | Status: DC
  Administered 2012-06-12 – 2012-06-13 (×2): 60 mg via ORAL
  Filled 2012-06-12 (×2): qty 1

## 2012-06-12 MED ORDER — SOTALOL HCL 120 MG PO TABS
120.0000 mg | ORAL_TABLET | Freq: Two times a day (BID) | ORAL | Status: DC
  Administered 2012-06-12 – 2012-06-13 (×3): 120 mg via ORAL
  Filled 2012-06-12 (×4): qty 1

## 2012-06-12 MED ORDER — ASPIRIN 325 MG PO TABS: 325.0000 mg | ORAL_TABLET | Freq: Every day | ORAL | Status: DC

## 2012-06-12 MED ORDER — IPRATROPIUM BROMIDE 0.06 % NA SOLN
2.0000 | Freq: Every morning | NASAL | Status: DC
  Administered 2012-06-13: 2 via NASAL
  Filled 2012-06-12: qty 15

## 2012-06-12 MED ORDER — PANTOPRAZOLE SODIUM 40 MG PO TBEC
40.0000 mg | DELAYED_RELEASE_TABLET | Freq: Every day | ORAL | Status: DC
  Administered 2012-06-12 – 2012-06-13 (×2): 40 mg via ORAL
  Filled 2012-06-12 (×2): qty 1

## 2012-06-12 NOTE — ED Provider Notes (Signed)
History     CSN: [id number]  Arrival date & time 06/11/12  1744   First MD Initiated Contact with Patient 06/11/12 2306      Chief Complaint  Patient presents with  . Aphasia  . Headache    (Consider location/radiation/quality/duration/timing/severity/associated sxs/prior treatment) HPI Patient is a 78 year old female who presents today for evaluation of a 10 minute episode of garbled speech that occurred today around 2 PM. Patient had last been seen normal yesterday, July 8, at 10 PM. Her episode of difficulty with speech was not accompanied by any weakness or numbness. The patient was hypertensive upon arrival. She was asymptomatic upon arrival here. She denied any associated chest pain, shortness of breath, palpitations, nausea, vomiting, urinary symptoms, fevers, or recent cough. She had been told to come in the emergency department by her primary care physician, Dr. Durwin Nora. Patient has no history of TIA or CVA. She's not on aspirin, Plavix, or Coumadin. She does have a pacemaker and a history of high blood pressure as well as high cholesterol. She believes she did take her blood pressure medications prior to presentation. There are no other associated or modifying factors. Past Medical History  Diagnosis Date  . Hypertension   . Hyperlipidemia   . GERD (gastroesophageal reflux disease)   . Dysphagia     appt with Molly Maduro Buccini upcoming  . Chronic recurrent sinusitis   . Recurrent UTI     Past Surgical History  Procedure Date  . Pacemaker insertion   . Back surgery 1985  . Shoulder surgery 2008    Left  (Dr. Ranell Patrick)  . Total knee arthroplasty 2000, 2001    bilateral (Dr. Despina Hick), left 2000, right 2001  . Cataract extraction 1986, 2003    left 1986, right 2003  . Vaginal hysterectomy 1969  . Bilateral salpingoophorectomy 1971  . Tonsillectomy 1951  . Appendectomy 1952  . Mastoidectomy 1970  . Sinus exploration 1972  . Knee arthroscopy 1988, 1995, 2000  .  Hemorrhoid surgery 2009  . Repair / reconstruction interphalangeal joint 2004    R thumb  . Cystoscopy 1992    History reviewed. No pertinent family history.  History  Substance Use Topics  . Smoking status: Former Smoker -- 1.0 packs/day for 15 years    Types: Cigarettes    Quit date: 12/04/1981  . Smokeless tobacco: Not on file  . Alcohol Use: No    OB History    Grav Para Term Preterm Abortions TAB SAB Ect Mult Living                  Review of Systems  Constitutional: Negative.   HENT: Negative.   Eyes: Negative.   Respiratory: Negative.   Cardiovascular: Negative.   Gastrointestinal: Negative.   Genitourinary: Negative.   Musculoskeletal: Negative.   Skin: Negative.   Neurological: Positive for speech difficulty.  Hematological: Negative.   Psychiatric/Behavioral: Negative.   All other systems reviewed and are negative.    Allergies  Review of patient's allergies indicates no known allergies.  Home Medications   Current Outpatient Rx  Name Route Sig Dispense Refill  . ALLOPURINOL 100 MG PO TABS Oral Take 100 mg by mouth 2 (two) times daily.    . ASPIRIN 325 MG PO TBEC Oral Take 325 mg by mouth daily.    Marland Kitchen CALCITRIOL 0.25 MCG PO CAPS Oral Take 0.25 mcg by mouth daily.    Marland Kitchen DM-GUAIFENESIN ER 30-600 MG PO TB12 Oral Take 1 tablet by mouth  every 12 (twelve) hours.    . DULOXETINE HCL 60 MG PO CPEP Oral Take 60 mg by mouth daily.    . FUROSEMIDE 40 MG PO TABS Oral Take 40 mg by mouth daily.    . IPRATROPIUM BROMIDE 0.06 % NA SOLN Nasal Place 2 sprays into the nose every morning.    Marland Kitchen LEVOTHYROXINE SODIUM 25 MCG PO TABS Oral Take 25 mcg by mouth daily.    . ADULT MULTIVITAMIN W/MINERALS CH Oral Take 1 tablet by mouth daily.    . OMEGA-3-ACID ETHYL ESTERS 1 G PO CAPS Oral Take 2 g by mouth daily.    Marland Kitchen OMEPRAZOLE 20 MG PO CPDR Oral Take 20 mg by mouth daily.    Marland Kitchen POTASSIUM CHLORIDE CRYS ER 20 MEQ PO TBCR Oral Take 20 mEq by mouth 2 (two) times daily.    Marland Kitchen  SOTALOL HCL 120 MG PO TABS Oral Take 120 mg by mouth 2 (two) times daily.    . TOLTERODINE TARTRATE ER 4 MG PO CP24 Oral Take 4 mg by mouth at bedtime.    . TRIAMCINOLONE ACETONIDE 55 MCG/ACT NA INHA Nasal Place 2 sprays into the nose daily.    Marland Kitchen SIMVASTATIN 40 MG PO TABS Oral Take 40 mg by mouth every evening.      BP 188/64  Pulse 68  Temp 98 F (36.7 C) (Oral)  Resp 21  SpO2 95%  Physical Exam  Nursing note and vitals reviewed. GEN: Well-developed, well-nourished female in no distress HEENT: Atraumatic, normocephalic. Oropharynx clear without erythema EYES: PERRLA BL, no scleral icterus. NECK: Trachea midline, no meningismus CV: regular rate and rhythm. No murmurs, rubs, or gallops PULM: No respiratory distress.  No crackles, wheezes, or rales. GI: soft, non-tender. No guarding, rebound, or tenderness. + bowel sounds,rectal exam with guaiac negative stool and no gross blood on the glove.  GU: deferred Neuro: cranial nerves grossly 2-12 intact, no abnormalities of strength or sensation, A and O x 3, no pronator drift, no dysmetria on finger to nose task bilaterally MSK: Patient moves all 4 extremities symmetrically, no deformity, edema, or injury noted Skin: No rashes petechiae, purpura, or jaundice Psych: no abnormality of mood   ED Course  Procedures (including critical care time)  Indication: TIA Please note this EKG was reviewed extemporaneously by myself.   Date: 06/11/2012  Rate: 67  Rhythm: atrially paced  QRS Axis: left  Intervals: normal  ST/T Wave abnormalities: nonspecific T wave changes  Conduction Disutrbances:none  Narrative Interpretation:   Old EKG Reviewed: unchanged      Labs Reviewed  CBC - Abnormal; Notable for the following:    RBC 3.55 (*)     Hemoglobin 9.9 (*)     HCT 31.3 (*)     RDW 16.5 (*)     All other components within normal limits  DIFFERENTIAL - Abnormal; Notable for the following:    Eosinophils Relative 8 (*)     All other  components within normal limits  COMPREHENSIVE METABOLIC PANEL - Abnormal; Notable for the following:    Total Bilirubin 0.2 (*)     GFR calc non Af Amer 51 (*)     GFR calc Af Amer 59 (*)     All other components within normal limits  URINALYSIS, ROUTINE W REFLEX MICROSCOPIC - Abnormal; Notable for the following:    Protein, ur 30 (*)     All other components within normal limits  PROTIME-INR  APTT  TROPONIN I  GLUCOSE, CAPILLARY  URINE MICROSCOPIC-ADD ON   Ct Head Wo Contrast  06/12/2012  *RADIOLOGY REPORT*  Clinical Data: Aphasia  CT HEAD WITHOUT CONTRAST  Technique:  Contiguous axial images were obtained from the base of the skull through the vertex without contrast.  Comparison: 10/23/2006  Findings: Moderate global atrophy.  Moderate chronic ischemic changes in the periventricular white matter.  No mass effect, midline shift, or acute intracranial hemorrhage.  Left mastoid air cells are clear.  Mucosal thickening in the right ethmoid air cells.  Postoperative changes in the right mastoid air cells.  IMPRESSION: No acute intracranial pathology.  Original Report Authenticated By: Donavan Burnet, M.D.     1. TIA (transient ischemic attack)       MDM  Patient was evaluated by myself and was completely asymptomatic. Patient did have an ABCD squared score of 4. She was hypertensive but given concern for ischemic stroke values were not high enough to indicate lowering that might result in a watershed infarct. Patient had laboratory workup significant only for anemia with a hemoglobin of 9.9. She had reported recent blood in her stools but had none on exam here. Recent blood work from her physician's office was reviewed on line by myself with a hemoglobin of 9.5 on June 24. Patient had a negative troponin and absolutely no chest pain. Fingerstick was within normal limits. Head CT was unremarkable. Patient was admitted to the hospitalist service for further workup. They have requested that I  place a call to neurology and I did notify the neurologist at the patient's admission. Blood pressure improved with systolic in the 170s prior to admission. Given this no further medications were given. As patient did not have any active bleeding she was given a dose of aspirin as well after passing her swallow screen.        Cyndra Numbers, MD 06/12/12 [id number]

## 2012-06-12 NOTE — Evaluation (Signed)
Speech Language Pathology Evaluation Patient Details Name: [name] MRN: [id number] DOB: [birth year] Today's Date: 06/12/2012 Time: 7829-5621 SLP Time Calculation (min): 10 min  Problem List:  Patient Active Problem List  Diagnosis  . TIA (transient ischemic attack)  . Hypertension  . Hyperlipidemia  . Microscopic hematuria  . Anemia   Past Medical History:  Past Medical History  Diagnosis Date  . Hypertension   . Hyperlipidemia   . GERD (gastroesophageal reflux disease)   . Dysphagia     appt with Molly Maduro Buccini upcoming  . Chronic recurrent sinusitis   . Recurrent UTI   . Atrial fibrillation     ???  . Arthritis    Past Surgical History:  Past Surgical History  Procedure Date  . Pacemaker insertion   . Back surgery 1985  . Shoulder surgery 2008    Left  (Dr. Ranell Patrick)  . Total knee arthroplasty 2000, 2001    bilateral (Dr. Despina Hick), left 2000, right 2001  . Cataract extraction 1986, 2003    left 1986, right 2003  . Vaginal hysterectomy 1969  . Bilateral salpingoophorectomy 1971  . Tonsillectomy 1951  . Appendectomy 1952  . Mastoidectomy 1970  . Sinus exploration 1972  . Knee arthroscopy 1988, 1995, 2000  . Hemorrhoid surgery 2009  . Repair / reconstruction interphalangeal joint 2004    R thumb  . Cystoscopy 1992  . Insert / replace / remove pacemaker    HPI:  78 yr old admitted with garbled speech lasting 10 minutes.  CT negative.  PMH:  GERD, HTN, dysphagia.  Pt. reports her expressive speech appears completely back to normal and denies cognitive changes.   Assessment / Plan / Recommendation Clinical Impression  Pt. appeared to have min-mild difficutly with semantics and thought organization (possibly some dysnomia) evident at initial portion of assessment.  Pt.'s verbal fluency and meaning improved during session.  She admitted to garbled speech yesterday but states she feels it is 100% back to baseline.  Minimal emergent and anticipatory awareness  deficits, however, overall cognition for hospital and home situations appears functional with check in assist during the day  No ST warrented at this time.     SLP Assessment  Patient does not need any further Speech Lanaguage Pathology Services    Follow Up Recommendations  None    Frequency and Duration               SLP Evaluation Prior Functioning  Cognitive/Linguistic Baseline: Within functional limits Type of Home: House Lives With: Son Available Help at Discharge:  (not yet determined) Vocation: Retired   IT consultant  Overall Cognitive Status: Impaired Arousal/Alertness: Awake/alert Orientation Level: Disoriented X4 Attention: Sustained Sustained Attention: Appears intact Memory: Appears intact (reports trouble recalling names) Awareness: Impaired Awareness Impairment: Emergent impairment;Anticipatory impairment Problem Solving: Appears intact Safety/Judgment: Appears intact    Comprehension  Auditory Comprehension Overall Auditory Comprehension: Appears within functional limits for tasks assessed Visual Recognition/Discrimination Discrimination: Not tested Reading Comprehension Reading Status: Not tested    Expression Expression Primary Mode of Expression: Verbal Verbal Expression Overall Verbal Expression: Impaired Initiation: No impairment Automatic Speech:  (NT) Level of Generative/Spontaneous Verbalization: Conversation Repetition: No impairment Naming: Not tested Pragmatics: No impairment Non-Verbal Means of Communication: Not applicable Other Verbal Expression Comments:  (Minimally decreased semantics, related to thought organizati) Written Expression Written Expression: Not tested   Oral / Motor Oral Motor/Sensory Function Overall Oral Motor/Sensory Function: Appears within functional limits for tasks assessed Motor Speech Overall Motor Speech: Appears within  functional limits for tasks assessed Intelligibility: Intelligible Motor Planning:  Witnin functional limits   Functional Assessment Tool Used: clinical judgement Functional Limitations: Spoken language comprehension Swallow Current Status [phone]): 0 percent impaired, limited or restricted Swallow Goal Status (U0454): 0 percent impaired, limited or restricted Swallow Discharge Status [phone]): 0 percent impaired, limited or restricted Spoken Language Comprehension Current Status [phone]): 0 percent impaired, limited or restricted Spoken Language Comprehension Goal Status (G9160): 0 percent impaired, limited or restricted Spoken Language Comprehension Discharge Status [phone]): 0 percent impaired, limited or restricted Spoken Language Expression Current Status (Z3086): 0 percent impaired, limited or restricted Spoken Language Expression Goal Status (V7846): 0 percent impaired, limited or restricted Spoken Language Expression Discharge Status [phone]): 0 percent impaired, limited or restricted   Royce Macadamia M.Ed [employer] [phone]  06/12/2012

## 2012-06-12 NOTE — Evaluation (Signed)
Clinical/Bedside Swallow Evaluation Patient Details  Name: [name] MRN: [id number] Date of Birth: 05-05-1931  Today's Date: 06/12/2012 Time: 0825-0835 SLP Time Calculation (min): 10 min  Past Medical History:  Past Medical History  Diagnosis Date  . Hypertension   . Hyperlipidemia   . GERD (gastroesophageal reflux disease)   . Dysphagia     appt with Molly Maduro Buccini upcoming  . Chronic recurrent sinusitis   . Recurrent UTI   . Atrial fibrillation     ???  . Arthritis    Past Surgical History:  Past Surgical History  Procedure Date  . Pacemaker insertion   . Back surgery 1985  . Shoulder surgery 2008    Left  (Dr. Ranell Patrick)  . Total knee arthroplasty 2000, 2001    bilateral (Dr. Despina Hick), left 2000, right 2001  . Cataract extraction 1986, 2003    left 1986, right 2003  . Vaginal hysterectomy 1969  . Bilateral salpingoophorectomy 1971  . Tonsillectomy 1951  . Appendectomy 1952  . Mastoidectomy 1970  . Sinus exploration 1972  . Knee arthroscopy 1988, 1995, 2000  . Hemorrhoid surgery 2009  . Repair / reconstruction interphalangeal joint 2004    R thumb  . Cystoscopy 1992  . Insert / replace / remove pacemaker    HPI:  78 yr old admitted wit garbled speech lasting 10 minutes.  CT negative.  PMH:  GERD, HTN, dysphagia.  Pt. complains of food sticking in throat/chest and having to regurgitate one every couple weeks to dislodge food.  She reports having an appointment with Dr. Matthias Hughs in August. (GI MD).  She states occasional coughing with food/liquids that is infrequent.   Assessment / Plan / Recommendation Clinical Impression  Pt.'s oropharyngeal swallow function judged to be WFL's.  Belching present during assessment and one complaint of globus sensation in throat indicative of esophageal impairments  No evidence of aspiration Suspect symptoms are esophgeal in nature either related to current GERD or possible additional esophageal deficits.  Reviewed esophageal  precautions with pt. To minimize symptoms of GERD.    Aspiration Risk  Mild    Diet Recommendation Regular;Thin liquid   Liquid Administration via: Spoon;Straw Medication Administration: Whole meds with liquid Supervision: Patient able to self feed Compensations: Slow rate;Small sips/bites Postural Changes and/or Swallow Maneuvers: Seated upright 90 degrees;Upright 30-60 min after meal    Other  Recommendations Recommended Consults:  (has appmt with GI MD) Oral Care Recommendations: Oral care BID   Follow Up Recommendations  None    Frequency and Duration                Swallow Study Prior Functional Status       General HPI: 78 yr old admitted wit garbled speech lasting 10 minutes.  CT negative.  PMH:  GERD, HTN, dysphagia.  Pt. complains of food sticking in throat/chest and having to regurgitate one every couple weeks to dislodge food.  She reports having an appointment with Dr. Matthias Hughs in August. (GI MD).  She states occasional coughing with food/liquids that is infrequent. Type of Study: Bedside swallow evaluation Diet Prior to this Study: NPO Temperature Spikes Noted: No Respiratory Status: Room air History of Recent Intubation: No Behavior/Cognition: Alert;Cooperative Oral Cavity - Dentition: Dentures, top (bottom dental implants) Self-Feeding Abilities: Able to feed self Patient Positioning: Upright in bed Baseline Vocal Quality: Clear Volitional Cough: Strong Volitional Swallow: Able to elicit    Oral/Motor/Sensory Function Overall Oral Motor/Sensory Function: Appears within functional limits for tasks assessed  Ice Chips Ice chips: Not tested   Thin Liquid Thin Liquid: Within functional limits Presentation: Cup;Straw    Nectar Thick Nectar Thick Liquid: Not tested   Honey Thick Honey Thick Liquid: Not tested   Puree Puree: Within functional limits   Solid Solid: Within functional limits    Royce Macadamia M.Ed [employer]  [phone] 06/12/2012

## 2012-06-12 NOTE — Progress Notes (Signed)
Patient seen and examined, speech clear, no focal weakness.  She was on coumadin but it was discontinue because she was falling very frequently.   General: no distress.  Neuro: speech clear, no focal weakness.  Lung: CTA.  1-TIA vs Stroke:  Stroke work up in process.  Will repeat CT head tomorrow, patient unable to get MRI because of pacemaker.  Continue with aspirin and plavix.  ECHO, Doppler.  PT, OT consult.   2-A fib: Continue with coreg, sotalol. Aspirin 325 mg.  3-Hypertension: coreg, lasix. 4- Anemia:  Check iron studies, b12, folate, esr, tsh, spep, upep  Cont on omeprazole  Check hemoccult stool 5-Microscopic hematuria:  Urine cytology  CT scan abd/pelvis.Marland KitchenMarland KitchenMarland KitchenRenal cyst, follow up out patient.

## 2012-06-12 NOTE — Progress Notes (Signed)
VASCULAR LAB PRELIMINARY  PRELIMINARY  PRELIMINARY  PRELIMINARY  Carotid duplex completed.    Preliminary report:  Bilateral:  No evidence of hemodynamically significant internal carotid artery stenosis.   Vertebral artery flow is antegrade.      Jaydin Boniface, RVS 06/12/2012, 9:48 AM

## 2012-06-12 NOTE — Evaluation (Signed)
Physical Therapy Evaluation Patient Details Name: [name] MRN: [id number] DOB: 07-29-1931 Today's Date: 06/12/2012 Time: 1330-1403 PT Time Calculation (min): 33 min  PT Assessment / Plan / Recommendation Clinical Impression  Pt is a 78 y/o female admitted s/p CVA vs TIA along with the below PT problem list.  Pt would benefit from acute PT to maximize independence and facilitate d/c home with HHPT.    PT Assessment  Patient needs continued PT services    Follow Up Recommendations  Home health PT    Barriers to Discharge None      Equipment Recommendations  None recommended by PT    Recommendations for Other Services     Frequency Min 4X/week    Precautions / Restrictions Precautions Precautions: None Restrictions Weight Bearing Restrictions: No   Pertinent Vitals/Pain None      Mobility  Bed Mobility Bed Mobility: Sit to Supine Sit to Supine: 5: Supervision Details for Bed Mobility Assistance: Verbal cues for sequence. Transfers Transfers: Sit to Stand;Stand to Sit (2 trials.) Sit to Stand: 4: Min assist;With upper extremity assist;From chair/3-in-1;From bed Stand to Sit: 4: Min guard;With upper extremity assist;To bed Details for Transfer Assistance: Assist and guarding for balance with cues for safety. Ambulation/Gait Ambulation/Gait Assistance: 4: Min assist Ambulation Distance (Feet): 55 Feet (40 and 15 feet.) Assistive device: Rolling walker Ambulation/Gait Assistance Details: Assist for balance with cues for tall posture as well as safety inside RW. Gait Pattern: Decreased stride length;Trunk flexed;Shuffle Stairs: No Wheelchair Mobility Wheelchair Mobility: No Modified Rankin (Stroke Patients Only) Pre-Morbid Rankin Score: No symptoms Modified Rankin: Moderately severe disability    Exercises     PT Diagnosis: Difficulty walking;Generalized weakness  PT Problem List: Decreased strength;Decreased balance;Decreased activity tolerance;Decreased  mobility;Decreased knowledge of use of DME PT Treatment Interventions: DME instruction;Gait training;Stair training;Functional mobility training;Therapeutic activities;Balance training;Patient/family education;Neuromuscular re-education   PT Goals Acute Rehab PT Goals PT Goal Formulation: With patient Time For Goal Achievement: 06/19/12 Potential to Achieve Goals: Good Pt will go Supine/Side to Sit: with modified independence PT Goal: Supine/Side to Sit - Progress: Goal set today Pt will go Sit to Supine/Side: with modified independence PT Goal: Sit to Supine/Side - Progress: Goal set today Pt will go Sit to Stand: with modified independence PT Goal: Sit to Stand - Progress: Goal set today Pt will go Stand to Sit: with modified independence PT Goal: Stand to Sit - Progress: Goal set today Pt will Ambulate: 51 - 150 feet;with modified independence;with least restrictive assistive device PT Goal: Ambulate - Progress: Goal set today Pt will Go Up / Down Stairs: Flight;with modified independence;with least restrictive assistive device PT Goal: Up/Down Stairs - Progress: Goal set today  Visit Information  Last PT Received On: 06/12/12 Assistance Needed: +1    Subjective Data  Subjective: "I am suppose to be eating, but we can do whatever." Patient Stated Goal: Go home.   Prior Functioning  Home Living Lives With: Son Available Help at Discharge: Available PRN/intermittently Type of Home: House Home Access: Stairs to enter [employer] of Steps: 3 Entrance Stairs-Rails: Can reach both Home Layout: Two level Alternate Level Stairs-Number of Steps: 12 Alternate Level Stairs-Rails: Can reach both Home Adaptive Equipment: Walker - rolling;Walker - four wheeled;Bedside commode/3-in-1;Shower chair with back Prior Function Level of Independence: Independent with assistive device(s) Able to Take Stairs?: Yes Driving: No Vocation: Retired Musician: No  difficulties    Cognition  Overall Cognitive Status: Appears within functional limits for tasks assessed/performed Arousal/Alertness: Awake/alert  Orientation Level: Appears intact for tasks assessed Behavior During Session: [facility] for tasks performed    Extremity/Trunk Assessment Right Upper Extremity Assessment RUE ROM/Strength/Tone: Within functional levels RUE Sensation: WFL - Light Touch RUE Coordination: WFL - gross/fine motor Left Upper Extremity Assessment LUE ROM/Strength/Tone: Within functional levels LUE Sensation: WFL - Light Touch LUE Coordination: WFL - gross/fine motor Right Lower Extremity Assessment RLE ROM/Strength/Tone: Within functional levels RLE Sensation: WFL - Light Touch RLE Coordination: WFL - gross/fine motor Left Lower Extremity Assessment LLE ROM/Strength/Tone: Within functional levels LLE Sensation: WFL - Light Touch LLE Coordination: WFL - gross/fine motor Trunk Assessment Trunk Assessment: Normal   Balance Balance Balance Assessed: No  End of Session PT - End of Session Equipment Utilized During Treatment: Gait belt Activity Tolerance: Patient tolerated treatment well Patient left: in bed;with call bell/phone within reach Nurse Communication: Mobility status  GP     Cephus Shelling 06/12/2012, 2:07 PM  06/12/2012 Cephus Shelling, PT, DPT [phone]

## 2012-06-12 NOTE — Consult Note (Signed)
Reason for Consult:TIA   CC: TIA/transient slurred speach  HPI: [name] is an 78 y.o. female who was noted by son to have dysarthria when speaking.  Per patient, when her son returned home yesterday he noted the end of er sentences seemed dysarthric and to not make sense.  Patient felt as though her words would become jumbled and she could not articulate what she wanted to say. This lasted for about 10 minutes and then cleared.  Patient was brought to the ED but had cleared by that time.  She has a history of atrial fibrillation, status post pacemaker placement 2006. She was on coumadin in past but was found to be a fall risk and taken off the coumadin.  She takes ASA as well as Fiorinal on different days at home on a relatively fixed schedule. She is effectively dosed with ASA every day, when accounting for her regular Fiorinal use.  At present time, she is back to her baseline.   Past Medical History  Diagnosis Date  . Hypertension   . Hyperlipidemia   . GERD (gastroesophageal reflux disease)   . Dysphagia     appt with Molly Maduro Buccini upcoming  . Chronic recurrent sinusitis   . Recurrent UTI   . Atrial fibrillation       . Arthritis     Past Surgical History  Procedure Date  . Pacemaker insertion   . Back surgery 1985  . Shoulder surgery 2008    Left  (Dr. Ranell Patrick)  . Total knee arthroplasty 2000, 2001    bilateral (Dr. Despina Hick), left 2000, right 2001  . Cataract extraction 1986, 2003    left 1986, right 2003  . Vaginal hysterectomy 1969  . Bilateral salpingoophorectomy 1971  . Tonsillectomy 1951  . Appendectomy 1952  . Mastoidectomy 1970  . Sinus exploration 1972  . Knee arthroscopy 1988, 1995, 2000  . Hemorrhoid surgery 2009  . Repair / reconstruction interphalangeal joint 2004    R thumb  . Cystoscopy 1992  . Insert  pacemaker 2006    History reviewed. No pertinent family history.  Social History:  reports that she quit smoking about 30 years ago. Her  smoking use included Cigarettes. She has a 15 pack-year smoking history. She does not have any smokeless tobacco history on file. She reports that she does not drink alcohol or use illicit drugs.  No Known Allergies  Medications:  Prior to Admission:  Prescriptions prior to admission  Medication Sig Dispense Refill  . allopurinol (ZYLOPRIM) 100 MG tablet Take 100 mg by mouth 2 (two) times daily.      Marland Kitchen aspirin 325 MG EC tablet Take 325 mg by mouth daily.      . calcitRIOL (ROCALTROL) 0.25 MCG capsule Take 0.25 mcg by mouth daily.      Marland Kitchen dextromethorphan-guaiFENesin (MUCINEX DM) 30-600 MG per 12 hr tablet Take 1 tablet by mouth every 12 (twelve) hours.      . DULoxetine (CYMBALTA) 60 MG capsule Take 60 mg by mouth daily.      . furosemide (LASIX) 40 MG tablet Take 40 mg by mouth daily.      Marland Kitchen ipratropium (ATROVENT) 0.06 % nasal spray Place 2 sprays into the nose every morning.      Marland Kitchen levothyroxine (SYNTHROID, LEVOTHROID) 25 MCG tablet Take 25 mcg by mouth daily.      . Multiple Vitamin (MULTIVITAMIN WITH MINERALS) TABS Take 1 tablet by mouth daily.      Marland Kitchen omega-3 acid  ethyl esters (LOVAZA) 1 G capsule Take 2 g by mouth daily.      Marland Kitchen omeprazole (PRILOSEC) 20 MG capsule Take 20 mg by mouth daily.      . potassium chloride SA (K-DUR,KLOR-CON) 20 MEQ tablet Take 20 mEq by mouth 2 (two) times daily.      . sotalol (BETAPACE) 120 MG tablet Take 120 mg by mouth 2 (two) times daily.      Marland Kitchen tolterodine (DETROL LA) 4 MG 24 hr capsule Take 4 mg by mouth at bedtime.      . triamcinolone (NASACORT) 55 MCG/ACT nasal inhaler Place 2 sprays into the nose daily.      . simvastatin (ZOCOR) 40 MG tablet Take 40 mg by mouth every evening.       Scheduled:    . allopurinol  100 mg Oral BID  . aspirin  324 mg Oral Once  . calcitRIOL  0.25 mcg Oral Daily  . clopidogrel  75 mg Oral Q breakfast  . dextromethorphan-guaiFENesin  1 tablet Oral Q12H  . DULoxetine  60 mg Oral Daily  . fesoterodine  4 mg Oral  Daily  . fluticasone  2 spray Each Nare Daily  . furosemide  40 mg Oral Daily  . ipratropium  2 spray Nasal q morning - 10a  . levothyroxine  25 mcg Oral Q0600  . multivitamin with minerals  1 tablet Oral Daily  . omega-3 acid ethyl esters  2 g Oral Q breakfast  . pantoprazole  40 mg Oral Q1200  . potassium chloride SA  20 mEq Oral BID  . simvastatin  40 mg Oral QPM  . sotalol  120 mg Oral BID  . DISCONTD: aspirin  324 mg Oral Once  . DISCONTD: aspirin  325 mg Oral Daily  . DISCONTD: clopidogrel  75 mg Oral Q breakfast    ROS: History obtained from the patient  General ROS: negative for - chills, fatigue, fever, night sweats, weight gain or weight loss Psychological ROS: negative for - behavioral disorder, hallucinations, memory difficulties, mood swings or suicidal ideation Ophthalmic ROS: negative for - blurry vision, double vision, eye pain or loss of vision ENT ROS: negative for - epistaxis, nasal discharge, oral lesions, sore throat, tinnitus or vertigo Allergy and Immunology ROS: negative for - hives or itchy/watery eyes Hematological and Lymphatic ROS: negative for - bleeding problems, bruising or swollen lymph nodes Endocrine ROS: negative for - galactorrhea, hair pattern changes, polydipsia/polyuria or temperature intolerance Respiratory ROS: negative for - cough, hemoptysis, shortness of breath or wheezing Cardiovascular ROS: negative for - chest pain, dyspnea on exertion, edema  Gastrointestinal ROS: negative for - abdominal pain, diarrhea, hematemesis, nausea/vomiting or stool incontinence Genito-Urinary ROS: negative for - dysuria, hematuria, incontinence or urinary frequency/urgency Musculoskeletal ROS: positive for - joint swelling or muscular weakness Neurological ROS: as noted in HPI Dermatological ROS: negative for rash   Physical Examination: Blood pressure 132/73, pulse 68, temperature 98.2 F (36.8 C), temperature source Oral, resp. rate 20, height 5\' 5"   (1.651 m), weight 71.215 kg (157 lb), SpO2 98.00%.  Neurologic Examination Mental Status: Alert, oriented, thought content appropriate.  Speech fluent without evidence of aphasia.  Able to follow 3 step commands without difficulty. Cranial Nerves: II: visual fields grossly normal, pupils equal, round, reactive to light and accommodation III,IV, VI: ptosis not present, extraocular muscles extra-ocular motions intact bilaterally V,VII: smile symmetric, facial light touch sensation normal bilaterally VIII: hearing normal bilaterallyt XI: trapezius strength/neck flexion strength normal bilaterally XII: tongue  strength normal  Motor: Right : Upper extremity    Left:     Upper extremity 4/5 deltoid       4/5 deltoid 4-/5 tricep      5/5 tricep 4+/5 biceps      5/5 biceps  5/5wrist flexion     5/5 wrist flexion 5/5 wrist extension     5/5 wrist extension 5/5 hand grip      4/5 hand grip  Lower extremity     Lower extremity 5/5 hip flexor      5/5 hip flexor 5/5 hip adductors     5/5 hip adductors 5/5 hip abductors     5/5 hip abductors 5/5 quadricep      5/5 quadriceps  5/5 hamstrings     5/5 hamstrings 5/5 plantar flexion       5/5 plantar flexion 5/5 plantar extension     5/5 plantar extension Tone and bulk:normal tone throughout; Atrophy noted in thenar eminence and intrinsic hand muscles Sensory: Pinprick and light touch intact throughout,with bilateral decreased temperature sensation in stocking distribution. Vibratory exam was inconsistent.  Deep Tendon Reflexes: 2+ bilateral UE 1+ bilateral KJ and minimal bilateral AJ.  symmetric throughout Plantars: Right: equivical  Left: equivocal Cerebellar: Normal finger-to-nose,  normal heel-to-shin test   Laboratory Studies:   Basic Metabolic Panel:  Lab 06/11/12 4098  NA 142  K 4.2  CL 105  CO2 26  GLUCOSE 90  BUN 12  CREATININE 1.02  CALCIUM 9.4  MG --  PHOS --    Liver Function Tests:  Lab 06/11/12 2336  AST 29  ALT  13  ALKPHOS 84  BILITOT 0.2*  PROT 7.0  ALBUMIN 3.6     CBC:  Lab 06/11/12 2336  WBC 6.7  NEUTROABS 3.1  HGB 9.9*  HCT 31.3*  MCV 88.2  PLT 224    Cardiac Enzymes:  Lab 06/11/12 2336  CKTOTAL --  CKMB --  CKMBINDEX --  TROPONINI <0.30    CBG:  Lab 06/12/12 0020  GLUCAP 86    Coagulation Studies:  Basename 06/11/12 2336  LABPROT 13.2  INR 0.98    Urinalysis:   Lab 06/12/12 0036  COLORURINE YELLOW  LABSPEC 1.014  PHURINE 6.5  GLUCOSEU NEGATIVE  HGBUR NEGATIVE  BILIRUBINUR NEGATIVE  KETONESUR NEGATIVE  PROTEINUR 30*  UROBILINOGEN 0.2  NITRITE NEGATIVE  LEUKOCYTESUR NEGATIVE    Lipid Panel:     Component Value Date/Time   CHOL 177 06/12/2012 0605   TRIG 229* 06/12/2012 0605   HDL 40 06/12/2012 0605   CHOLHDL 4.4 06/12/2012 0605   VLDL 46* 06/12/2012 0605   LDLCALC 91 06/12/2012 0605      Alcohol Level: No results found for this basename: ETH:2 in the last 168 hours  Other results: JXB:JYNWG rhythm Imaging: Ct Abdomen Pelvis Wo Contrast  06/12/2012  *RADIOLOGY REPORT*  Clinical Data: Microscopic hematuria; assess for renal stones.  CT ABDOMEN AND PELVIS WITHOUT CONTRAST  Technique:  Multidetector CT imaging of the abdomen and pelvis was performed following the standard protocol without intravenous contrast.  Comparison: CT of the abdomen and pelvis performed 02/15/2007  Findings: Mild nodular densities within the lungs are stable from 2008 and likely benign; minimal bibasilar atelectasis or scarring is seen.  Scattered coronary artery calcifications are seen.  A mildly lobulated 4.3 cm cyst is noted at the hepatic dome; additional scattered hypodensities within the liver are nonspecific but may reflect cysts.  These have increased in size from 2008. There is  stable intrahepatic biliary ductal dilatation and prominence of the common hepatic duct.  The spleen is within normal limits.  The gallbladder is unremarkable in appearance.  There is diffuse fatty  infiltration within the pancreas.  The adrenal glands are within normal limits.  Scattered hypodense and hyperdense cysts are noted within both kidneys, as on prior CT; some of these have increased mildly in size.  The largest of these measures 2.8 cm in size.  There is mild bilateral renal atrophy, worse on the left.  Mild nonspecific perinephric stranding is noted.  There is no evidence of hydronephrosis; no renal or ureteral stones are seen.  No free fluid is identified.  The small bowel is unremarkable in appearance.  The stomach is within normal limits.  No acute vascular abnormalities are seen.  Scattered calcification is noted along the abdominal aorta and its branches, particularly along the proximal left renal artery.  The patient is status post appendectomy.  Scattered diverticulosis is noted along the proximal sigmoid colon; the colon is otherwise unremarkable in appearance.  The bladder is mildly distended and grossly unremarkable in appearance.  The patient is status post hysterectomy; no suspicious adnexal masses are seen.  No inguinal lymphadenopathy is seen.  No acute osseous abnormalities are identified.  Chronic degenerative change is again noted along the lumbar spine, with multilevel vacuum phenomenon and disc space narrowing, and partial loss of height at vertebral body L4.  IMPRESSION:  1.  No acute abnormality seen within the abdomen or pelvis. 2.  Hepatic and renal cysts have increased mildly in size from 2008; bilateral renal atrophy, worse on the left.  Several of the left renal cysts are hyperdense in nature.  No renal or ureteral stones seen. 3.  Scattered calcification along the abdominal aorta and its branches, particularly along the proximal left renal artery. 4.  Chronic degenerative change along the lumbar spine, with partial chronic loss of height at L4. 5.  Mild nodular densities at the lung bases are stable from 2008 and likely benign. 6.  Mild scattered coronary artery  calcifications seen. 7.  Stable intrahepatic biliary ductal dilatation and prominence of the common hepatic duct, likely reflecting the patient's baseline  Original Report Authenticated By: Tonia Ghent, M.D.   Ct Head Wo Contrast  06/12/2012  *RADIOLOGY REPORT*  Clinical Data: Aphasia  CT HEAD WITHOUT CONTRAST  Technique:  Contiguous axial images were obtained from the base of the skull through the vertex without contrast.  Comparison: 10/23/2006  Findings: Moderate global atrophy.  Moderate chronic ischemic changes in the periventricular white matter.  No mass effect, midline shift, or acute intracranial hemorrhage.  Left mastoid air cells are clear.  Mucosal thickening in the right ethmoid air cells.  Postoperative changes in the right mastoid air cells.  IMPRESSION: No acute intracranial pathology.  Original Report Authenticated By: Donavan Burnet, M.D.   Carotid dopplers--No ICA stenosis.   Assessment/Plan:  78 YO female with known history of A-fib S/P pace maker placement in 2006 now off coumadin due to fall risk. She has been taking ASA but not on a daily basis.  Patient admitted for transient dysarthria which cleared by time of arrival to the ED. Given patients history of A-fib and questionable compliance with ASA. most likely cause of transient speech abnormality is TIA.    Recommend: 1) Probable stroke/TIA. Patient is not a candidate for Coumadin due to fall risk . Patient was taking ASA every day and is considered a ASA failure. If patient  shows no intracranial stenosis would recommend placing patient on Aggrenox. If she shows significant intracranial stenosis would change to Plavix + ASA.  2) Carotid dopplers have been done and show no ICA stenosis.  Awaiting Echo report.  Would benefit from CTA head to evaluate large vessels as this is the second event in 2 weeks with transient speech difficulty.  3) LDL is 91.  Would consider changing her Zocor to Lipitor due to fewer SE 5) HbA1c is  pending.   6) Patient has been seen by speech therapy and signed off.  7) Agree with PT and OT evaluation prior to discharge.   Felicie Morn PA-C [facility] [phone]  06/12/2012, 3:18 PM  Electronically signed: Dr. Caryl Pina

## 2012-06-12 NOTE — ED Notes (Signed)
Admitting MD at bedside, will transport patient to floor upon assessment completion

## 2012-06-12 NOTE — Progress Notes (Signed)
*  PRELIMINARY RESULTS* Echocardiogram 2D Echocardiogram has been performed.  Jeryl Columbia 06/12/2012, 10:08 AM

## 2012-06-12 NOTE — H&P (Addendum)
[name] is an 78 y.o. female.    Pcp: Pat Patrick Renal: Dr. Darrick Penna GI  Molly Maduro Buccini  Chief Complaint: Crist Infante HPI: 78 yo female with hypertension, hyperlipidemia c/o dysarthria starting about 3pm.  Noticed by her son  "1st part of sentence ok then 2nd part garbled"  Lasted about 10 minutes.  Son called Dr. Tiburcio Pea' office and was told to come to ED for evaluation.  This has apparently happedned about 2 months ago, and resolved after a few minutes.  Pt notes headache since yesterday am, but she has had severe neck/ occipital/ temporal headache , fleeting in the past. This has been going on since her mastoidectomy.   Pt denies cp, palp, sob, n/v, focal weakness, numbness, tingling.    In ED, CT brain negative for any acute process,  ED requests admission for TIA.    Past Medical History  Diagnosis Date  . Hypertension   . Hyperlipidemia   . GERD (gastroesophageal reflux disease)   . Dysphagia     appt with Molly Maduro Buccini upcoming  . Chronic recurrent sinusitis     Past Surgical History  Procedure Date  . Pacemaker insertion   . Back surgery 1985  . Shoulder surgery 2008    Left  (Dr. Ranell Patrick)  . Total knee arthroplasty 2000, 2001    bilateral (Dr. Despina Hick), left 2000, right 2001  . Cataract extraction 1986, 2003    left 1986, right 2003  . Vaginal hysterectomy 1969  . Bilateral salpingoophorectomy 1971  . Tonsillectomy 1951  . Appendectomy 1952  . Mastoidectomy 1970  . Sinus exploration 1972  . Knee arthroscopy 1988, 1995, 2000  . Hemorrhoid surgery 2009  . Repair / reconstruction interphalangeal joint 2004    R thumb  . Cystoscopy 1992    History reviewed. No pertinent family history. Social History:  reports that she quit smoking about 30 years ago. Her smoking use included Cigarettes. She has a 15 pack-year smoking history. She does not have any smokeless tobacco history on file. She reports that she does not drink alcohol or use illicit drugs.  Allergies: No  Known Allergies   (Not in a hospital admission)  Results for orders placed during the hospital encounter of 06/11/12 (from the past 48 hour(s))  PROTIME-INR     Status: Normal   Collection Time   06/11/12 11:36 PM      Component Value Range Comment   Prothrombin Time 13.2  11.6 - 15.2 seconds    INR 0.98  0.00 - 1.49   APTT     Status: Normal   Collection Time   06/11/12 11:36 PM      Component Value Range Comment   aPTT 30  24 - 37 seconds   CBC     Status: Abnormal   Collection Time   06/11/12 11:36 PM      Component Value Range Comment   WBC 6.7  4.0 - 10.5 K/uL    RBC 3.55 (*) 3.87 - 5.11 MIL/uL    Hemoglobin 9.9 (*) 12.0 - 15.0 g/dL    HCT 16.1 (*) 09.6 - 46.0 %    MCV 88.2  78.0 - 100.0 fL    MCH 27.9  26.0 - 34.0 pg    MCHC 31.6  30.0 - 36.0 g/dL    RDW 04.5 (*) 40.9 - 15.5 %    Platelets 224  150 - 400 K/uL   DIFFERENTIAL     Status: Abnormal   Collection Time  06/11/12 11:36 PM      Component Value Range Comment   Neutrophils Relative 47  43 - 77 %    Neutro Abs 3.1  1.7 - 7.7 K/uL    Lymphocytes Relative 36  12 - 46 %    Lymphs Abs 2.4  0.7 - 4.0 K/uL    Monocytes Relative 9  3 - 12 %    Monocytes Absolute 0.6  0.1 - 1.0 K/uL    Eosinophils Relative 8 (*) 0 - 5 %    Eosinophils Absolute 0.5  0.0 - 0.7 K/uL    Basophils Relative 1  0 - 1 %    Basophils Absolute 0.0  0.0 - 0.1 K/uL   COMPREHENSIVE METABOLIC PANEL     Status: Abnormal   Collection Time   06/11/12 11:36 PM      Component Value Range Comment   Sodium 142  135 - 145 mEq/L    Potassium 4.2  3.5 - 5.1 mEq/L    Chloride 105  96 - 112 mEq/L    CO2 26  19 - 32 mEq/L    Glucose, Bld 90  70 - 99 mg/dL    BUN 12  6 - 23 mg/dL    Creatinine, Ser 0.45  0.50 - 1.10 mg/dL    Calcium 9.4  8.4 - 40.9 mg/dL    Total Protein 7.0  6.0 - 8.3 g/dL    Albumin 3.6  3.5 - 5.2 g/dL    AST 29  0 - 37 U/L    ALT 13  0 - 35 U/L    Alkaline Phosphatase 84  39 - 117 U/L    Total Bilirubin 0.2 (*) 0.3 - 1.2 mg/dL    GFR  calc non Af Amer 51 (*) >90 mL/min    GFR calc Af Amer 59 (*) >90 mL/min   TROPONIN I     Status: Normal   Collection Time   06/11/12 11:36 PM      Component Value Range Comment   Troponin I <0.30  <0.30 ng/mL   GLUCOSE, CAPILLARY     Status: Normal   Collection Time   06/12/12 12:20 AM      Component Value Range Comment   Glucose-Capillary 86  70 - 99 mg/dL   URINALYSIS, ROUTINE W REFLEX MICROSCOPIC     Status: Abnormal   Collection Time   06/12/12 12:36 AM      Component Value Range Comment   Color, Urine YELLOW  YELLOW    APPearance CLEAR  CLEAR    Specific Gravity, Urine 1.014  1.005 - 1.030    pH 6.5  5.0 - 8.0    Glucose, UA NEGATIVE  NEGATIVE mg/dL    Hgb urine dipstick NEGATIVE  NEGATIVE    Bilirubin Urine NEGATIVE  NEGATIVE    Ketones, ur NEGATIVE  NEGATIVE mg/dL    Protein, ur 30 (*) NEGATIVE mg/dL    Urobilinogen, UA 0.2  0.0 - 1.0 mg/dL    Nitrite NEGATIVE  NEGATIVE    Leukocytes, UA NEGATIVE  NEGATIVE   URINE MICROSCOPIC-ADD ON     Status: Normal   Collection Time   06/12/12 12:36 AM      Component Value Range Comment   Squamous Epithelial / LPF RARE  RARE    WBC, UA 0-2  <3 WBC/hpf    RBC / HPF 3-6  <3 RBC/hpf    Bacteria, UA RARE  RARE    Ct Head Wo Contrast  06/12/2012  *  RADIOLOGY REPORT*  Clinical Data: Aphasia  CT HEAD WITHOUT CONTRAST  Technique:  Contiguous axial images were obtained from the base of the skull through the vertex without contrast.  Comparison: 10/23/2006  Findings: Moderate global atrophy.  Moderate chronic ischemic changes in the periventricular white matter.  No mass effect, midline shift, or acute intracranial hemorrhage.  Left mastoid air cells are clear.  Mucosal thickening in the right ethmoid air cells.  Postoperative changes in the right mastoid air cells.  IMPRESSION: No acute intracranial pathology.  Original Report Authenticated By: Donavan Burnet, M.D.    Review of Systems  Constitutional: Negative for fever, chills, weight loss,  malaise/fatigue and diaphoresis.  HENT: Negative for hearing loss, ear pain, nosebleeds, congestion, neck pain, tinnitus and ear discharge.   Eyes: Negative for blurred vision, double vision, photophobia, pain, discharge and redness.       Dry eyes  Respiratory: Negative for cough, hemoptysis, sputum production, shortness of breath, wheezing and stridor.   Cardiovascular: Negative for chest pain, palpitations, orthopnea, claudication, leg swelling and PND.  Gastrointestinal: Positive for blood in stool. Negative for heartburn, nausea, vomiting, abdominal pain, diarrhea, constipation and melena.  Genitourinary: Negative for dysuria, urgency, frequency, hematuria and flank pain.  Musculoskeletal: Negative for myalgias, back pain, joint pain and falls.  Skin: Negative for itching and rash.  Neurological: Positive for headaches. Negative for dizziness, tingling, tremors, sensory change, speech change, focal weakness, seizures, loss of consciousness and weakness.  Endo/Heme/Allergies: Negative for environmental allergies and polydipsia. Does not bruise/bleed easily.  Psychiatric/Behavioral: Negative for depression, suicidal ideas, hallucinations, memory loss and substance abuse. The patient is not nervous/anxious and does not have insomnia.     Blood pressure 188/64, pulse 68, temperature 98 F (36.7 C), temperature source Oral, resp. rate 21, SpO2 95.00%. Physical Exam  Constitutional: She is oriented to person, place, and time. She appears well-developed and well-nourished. No distress.  HENT:  Head: Normocephalic and atraumatic.  Mouth/Throat: No oropharyngeal exudate.  Eyes: Conjunctivae and EOM are normal. Pupils are equal, round, and reactive to light. Right eye exhibits no discharge. Left eye exhibits no discharge. No scleral icterus.  Neck: Normal range of motion. Neck supple. No JVD present. No tracheal deviation present. No thyromegaly present.  Cardiovascular: Normal rate and regular  rhythm.  Exam reveals no gallop and no friction rub.   No murmur heard. Respiratory: Effort normal and breath sounds normal. No stridor. No respiratory distress. She has no wheezes. She has no rales. She exhibits no tenderness.  GI: Soft. Bowel sounds are normal. She exhibits no distension and no mass. There is no tenderness. There is no rebound and no guarding.  Musculoskeletal: Normal range of motion. She exhibits no edema and no tenderness.  Lymphadenopathy:    She has no cervical adenopathy.  Neurological: She is alert and oriented to person, place, and time. She has normal reflexes. She displays normal reflexes. No cranial nerve deficit. She exhibits normal muscle tone. Coordination normal.       downgoing toes bilaterally  Skin: Skin is warm and dry. No rash noted. She is not diaphoretic. No erythema. No pallor.  Psychiatric: She has a normal mood and affect. Her behavior is normal. Judgment and thought content normal.     Assessment/Plan TIA:  Cant not have MRI due to pacer tele Carotid u/s, cardiac echo Neurology consult, appreciate input,  Dr. Anne Hahn aware.  Pt on aspirin at baseline,  Will start on plavix  Hypertension: continue on present bp medications,  hydralazine prn  Hyperlipidemia: cont zocor  Gerd/Dysphagia: f/u with Buccini as outpatient Cont omeprazole  Anemia: Check iron studies, b12, folate, esr, tsh, spep, upep Cont on omeprazole Check hemoccult stool  Microscopic hematuria: Urine cytology CT scan abd/pelvis  ?AFib: Please expand database in the am to find out why not on coumadin if indeed carries dx of afib,  Assume at present time due to hx of blood in stool vs fall risk   Pearson Grippe 06/12/2012, 2:36 AM

## 2012-06-12 NOTE — Progress Notes (Signed)
Occupational Therapy Evaluation Patient Details Name: [name] MRN: [id number] DOB: 1931/11/11 Today's Date: 06/12/2012 Time: 8295-6213 OT Time Calculation (min): 29 min  OT Assessment / Plan / Recommendation Clinical Impression  Pt admitted s/p CVA vs TIA.  At this time, pt is at baseline level with ADLs with min guard for safety during functional mobility.  No DME needs.  Will sign off at this time, but please re-order if pt experiences decline in functional status.    OT Assessment  Patient does not need any further OT services    Follow Up Recommendations  Supervision - Intermittent;Supervision/Assistance - 24 hour (24/7 if available initially)    Barriers to Discharge      Equipment Recommendations  None recommended by OT    Recommendations for Other Services    Frequency       Precautions / Restrictions Precautions Precautions: None Restrictions Weight Bearing Restrictions: No   Pertinent Vitals/Pain See vitals    ADL  Grooming: Performed;Wash/dry hands;Independent Where Assessed - Grooming: Unsupported standing Upper Body Dressing: Performed;Independent Where Assessed - Upper Body Dressing: Unsupported sitting Lower Body Dressing: Performed;Modified independent Where Assessed - Lower Body Dressing: Unsupported sit to stand Toilet Transfer: Performed;Min guard Toilet Transfer Method: Sit to Barista: Comfort height toilet Toileting - Clothing Manipulation and Hygiene: Performed;Supervision/safety Where Assessed - Engineer, mining and Hygiene: Standing Equipment Used: Gait belt;Rolling walker Transfers/Ambulation Related to ADLs: min guard for safety ADL Comments: Pt at baseline level.    OT Diagnosis:    OT Problem List:   OT Treatment Interventions:     OT Goals    Visit Information  Last OT Received On: 06/12/12 Assistance Needed: +1    Subjective Data      Prior Functioning  Home Living Lives With:  Son Available Help at Discharge: Available PRN/intermittently Type of Home: House Home Access: Stairs to enter [employer] of Steps: 3 Entrance Stairs-Rails: Can reach both Home Layout: Two level Alternate Level Stairs-Number of Steps: 12 Alternate Level Stairs-Rails: Can reach both Bathroom Shower/Tub: Tub/shower unit;Walk-in shower (pt uses walk in shower) Bathroom Toilet: Standard Home Adaptive Equipment: Walker - rolling;Walker - four wheeled;Bedside commode/3-in-1;Shower chair with back Prior Function Level of Independence: Independent with assistive device(s) Able to Take Stairs?: Yes Driving: No Vocation: Retired Comments: Son takes care of cooking and cleaning. Pt spends much of her time reading. Communication Communication: No difficulties    Cognition  Overall Cognitive Status: Appears within functional limits for tasks assessed/performed Arousal/Alertness: Awake/alert Orientation Level: Appears intact for tasks assessed Behavior During Session: [facility] for tasks performed    Extremity/Trunk Assessment Right Upper Extremity Assessment RUE ROM/Strength/Tone: Within functional levels RUE Sensation: WFL - Light Touch RUE Coordination: WFL - gross/fine motor Left Upper Extremity Assessment LUE ROM/Strength/Tone: Within functional levels LUE Sensation: WFL - Light Touch LUE Coordination: WFL - gross/fine motor Right Lower Extremity Assessment RLE ROM/Strength/Tone: Within functional levels RLE Sensation: WFL - Light Touch RLE Coordination: WFL - gross/fine motor Left Lower Extremity Assessment LLE ROM/Strength/Tone: Within functional levels LLE Sensation: WFL - Light Touch LLE Coordination: WFL - gross/fine motor Trunk Assessment Trunk Assessment: Normal   Mobility Bed Mobility Bed Mobility: Supine to Sit;Sit to Supine Supine to Sit: 5: Supervision;HOB flat Sit to Supine: 5: Supervision;HOB flat Details for Bed Mobility Assistance: Verbal cues for  sequence. Transfers Sit to Stand: 4: Min guard;From bed;From toilet Stand to Sit: To bed;To chair/3-in-1;5: Supervision Details for Transfer Assistance: min guard/supervision for safety   Exercise  Balance Balance Balance Assessed: No  End of Session OT - End of Session Equipment Utilized During Treatment: Gait belt Activity Tolerance: Patient tolerated treatment well Patient left: in bed;with call bell/phone within reach;with bed alarm set;with nursing in room Nurse Communication: Mobility status  GO    06/12/2012 Cipriano Mile OTR/L Pager [phone] Office [phone]  Cipriano Mile 06/12/2012, 4:38 PM

## 2012-06-13 DIAGNOSIS — R3129 Other microscopic hematuria: Secondary | ICD-10-CM

## 2012-06-13 DIAGNOSIS — E785 Hyperlipidemia, unspecified: Secondary | ICD-10-CM

## 2012-06-13 DIAGNOSIS — I10 Essential (primary) hypertension: Secondary | ICD-10-CM

## 2012-06-13 DIAGNOSIS — G459 Transient cerebral ischemic attack, unspecified: Principal | ICD-10-CM

## 2012-06-13 MED ORDER — ASPIRIN 81 MG PO CHEW
81.0000 mg | CHEWABLE_TABLET | Freq: Every day | ORAL | Status: DC
Start: 2012-06-13 — End: 2012-06-13
  Administered 2012-06-13: 81 mg via ORAL
  Filled 2012-06-13: qty 1

## 2012-06-13 MED ORDER — ASPIRIN EC 81 MG PO TBEC: 81.0000 mg | DELAYED_RELEASE_TABLET | Freq: Every day | ORAL | Status: DC

## 2012-06-13 MED ORDER — ASPIRIN 81 MG PO TBEC: 81.0000 mg | DELAYED_RELEASE_TABLET | Freq: Every day | ORAL | Status: DC

## 2012-06-13 MED ORDER — CLOPIDOGREL BISULFATE 75 MG PO TABS: 75.0000 mg | ORAL_TABLET | Freq: Every day | ORAL | Status: DC

## 2012-06-13 MED ORDER — FERROUS SULFATE 325 (65 FE) MG PO TABS: 325.0000 mg | ORAL_TABLET | Freq: Two times a day (BID) | ORAL | Status: DC

## 2012-06-13 NOTE — Discharge Summary (Signed)
Physician Discharge Summary  [name] RUE:[id number] DOB: March 27, 1931 DOA: 06/11/2012  PCP: Sheila Oats, MD  Admit date: 06/11/2012 Discharge date: 06/13/2012  Discharge Diagnoses:    TIA (transient ischemic attack)  Hypertension  Hyperlipidemia  Microscopic hematuria, needs repeated UA.   Anemia, iron deficiency.   Renal and liver cyst, needs follow up with PCP.   Discharge Condition: Stable.   Disposition:  Follow-up Information    Follow up with Pcp Not In System in 1 week.         Diet:H[employer] Healthy.  History of present illness:  78 yo female with hypertension, hyperlipidemia c/o dysarthria starting about 3pm. Noticed by her son "1st part of sentence ok then 2nd part garbled" Lasted about 10 minutes. Son called Dr. Tiburcio Pea' office and was told to come to ED for evaluation. This has apparently happedned about 2 months ago, and resolved after a few minutes. Pt notes headache since yesterday am, but she has had severe neck/ occipital/ temporal headache , fleeting in the past. This has been going on since her mastoidectomy. Pt denies cp, palp, sob, n/v, focal weakness, numbness, tingling.  In ED, CT brain negative for any acute process, ED requests admission for TIA.   Hospital Course:   1-TIA:  Patient presented with TIA symptoms. She had initial CT head negative for bleed. She had CTA that was negative for acute stroke. Patient unable to get MRI because of pacemaker. Neurology recommend aspirin and plavix for secondary stroke prevention. ECHO pending, if negative for clot will discharge home. Doppler preliminary no ICA stenosis.   2-A fib: Continue with coreg, sotalol. Aspirin aspirin, plavix. 3-Hypertension: coreg, lasix.  4- Anemia: Iron deficiency. iron 28, B-12 2000, ESR 22, ferritin 12. SPEP, UPEP pending.  Cont on omeprazole, guaiac negative. Patient has appointment schedule with Dr Matthias Hughs for Endoscopy/ colonoscopy as per son, michael.  5-Microscopic hematuria:   Urine cytology. Need repeat UA. Follow up Renal US.  CT scan abd/pelvis.Marland KitchenMarland KitchenMarland KitchenRenal cyst, follow up out patient. Son aware of results.    Discharge Exam: Filed Vitals:   06/13/12 0600  BP: 130/65  Pulse: 86  Temp: 97.9 F (36.6 C)  Resp: 20   Filed Vitals:   06/12/12 1818 06/12/12 2200 06/13/12 0200 06/13/12 0600  BP: 123/61 137/98 148/75 130/65  Pulse: 78 82 74 86  Temp: 98 F (36.7 C) 97.8 F (36.6 C) 98.6 F (37 C) 97.9 F (36.6 C)  TempSrc:  Oral Oral Oral  Resp: 20 20 20 20   Height:      Weight:      SpO2: 98% 96% 97% 99%   General: No distress.  Cardiovascular: S1 S 2 IRR Respiratory: CTA Neuro: non focal Abdomen: soft , nt , nd.   Discharge Instructions  Discharge Orders    Future Orders Please Complete By Expires   Diet - low sodium heart healthy      Increase activity slowly        Medication List  As of 06/13/2012 10:48 AM   TAKE these medications         allopurinol 100 MG tablet   Commonly known as: ZYLOPRIM   Take 100 mg by mouth 2 (two) times daily.      aspirin 81 MG EC tablet   Take 1 tablet (81 mg total) by mouth daily. Swallow whole.      calcitRIOL 0.25 MCG capsule   Commonly known as: ROCALTROL   Take 0.25 mcg by mouth daily.  clopidogrel 75 MG tablet   Commonly known as: PLAVIX   Take 1 tablet (75 mg total) by mouth daily with breakfast.      dextromethorphan-guaiFENesin 30-600 MG per 12 hr tablet   Commonly known as: MUCINEX DM   Take 1 tablet by mouth every 12 (twelve) hours.      DULoxetine 60 MG capsule   Commonly known as: CYMBALTA   Take 60 mg by mouth daily.      furosemide 40 MG tablet   Commonly known as: LASIX   Take 40 mg by mouth daily.      ipratropium 0.06 % nasal spray   Commonly known as: ATROVENT   Place 2 sprays into the nose every morning.      levothyroxine 25 MCG tablet   Commonly known as: SYNTHROID, LEVOTHROID   Take 25 mcg by mouth daily.      multivitamin with minerals Tabs   Take 1 tablet  by mouth daily.      omega-3 acid ethyl esters 1 G capsule   Commonly known as: LOVAZA   Take 2 g by mouth daily.      omeprazole 20 MG capsule   Commonly known as: PRILOSEC   Take 20 mg by mouth daily.      potassium chloride SA 20 MEQ tablet   Commonly known as: K-DUR,KLOR-CON   Take 20 mEq by mouth 2 (two) times daily.      simvastatin 40 MG tablet   Commonly known as: ZOCOR   Take 40 mg by mouth every evening.      sotalol 120 MG tablet   Commonly known as: BETAPACE   Take 120 mg by mouth 2 (two) times daily.      tolterodine 4 MG 24 hr capsule   Commonly known as: DETROL LA   Take 4 mg by mouth at bedtime.      triamcinolone 55 MCG/ACT nasal inhaler   Commonly known as: NASACORT   Place 2 sprays into the nose daily.                      Ferrous sulfate 325 mg po BID.     The results of significant diagnostics from this hospitalization (including imaging, microbiology, ancillary and laboratory) are listed below for reference.    Significant Diagnostic Studies: Ct Abdomen Pelvis Wo Contrast  06/12/2012  *RADIOLOGY REPORT*  Clinical Data: Microscopic hematuria; assess for renal stones.  CT ABDOMEN AND PELVIS WITHOUT CONTRAST  Technique:  Multidetector CT imaging of the abdomen and pelvis was performed following the standard protocol without intravenous contrast.  Comparison: CT of the abdomen and pelvis performed 02/15/2007  Findings: Mild nodular densities within the lungs are stable from 2008 and likely benign; minimal bibasilar atelectasis or scarring is seen.  Scattered coronary artery calcifications are seen.  A mildly lobulated 4.3 cm cyst is noted at the hepatic dome; additional scattered hypodensities within the liver are nonspecific but may reflect cysts.  These have increased in size from 2008. There is stable intrahepatic biliary ductal dilatation and prominence of the common hepatic duct.  The spleen is within normal limits.  The gallbladder is unremarkable in  appearance.  There is diffuse fatty infiltration within the pancreas.  The adrenal glands are within normal limits.  Scattered hypodense and hyperdense cysts are noted within both kidneys, as on prior CT; some of these have increased mildly in size.  The largest of these measures 2.8 cm in size.  There is mild bilateral renal atrophy, worse on the left.  Mild nonspecific perinephric stranding is noted.  There is no evidence of hydronephrosis; no renal or ureteral stones are seen.  No free fluid is identified.  The small bowel is unremarkable in appearance.  The stomach is within normal limits.  No acute vascular abnormalities are seen.  Scattered calcification is noted along the abdominal aorta and its branches, particularly along the proximal left renal artery.  The patient is status post appendectomy.  Scattered diverticulosis is noted along the proximal sigmoid colon; the colon is otherwise unremarkable in appearance.  The bladder is mildly distended and grossly unremarkable in appearance.  The patient is status post hysterectomy; no suspicious adnexal masses are seen.  No inguinal lymphadenopathy is seen.  No acute osseous abnormalities are identified.  Chronic degenerative change is again noted along the lumbar spine, with multilevel vacuum phenomenon and disc space narrowing, and partial loss of height at vertebral body L4.  IMPRESSION:  1.  No acute abnormality seen within the abdomen or pelvis. 2.  Hepatic and renal cysts have increased mildly in size from 2008; bilateral renal atrophy, worse on the left.  Several of the left renal cysts are hyperdense in nature.  No renal or ureteral stones seen. 3.  Scattered calcification along the abdominal aorta and its branches, particularly along the proximal left renal artery. 4.  Chronic degenerative change along the lumbar spine, with partial chronic loss of height at L4. 5.  Mild nodular densities at the lung bases are stable from 2008 and likely benign. 6.  Mild  scattered coronary artery calcifications seen. 7.  Stable intrahepatic biliary ductal dilatation and prominence of the common hepatic duct, likely reflecting the patient's baseline  Original Report Authenticated By: Tonia Ghent, M.D.   Ct Angio Head W/cm &/or Wo Cm  06/12/2012  *RADIOLOGY REPORT*  Clinical Data:  Stroke.  Possible intracranial stenosis.  CT ANGIOGRAPHY HEAD  Technique:  Multidetector CT imaging of the head was performed using the standard protocol during bolus administration of intravenous contrast.  Multiplanar CT image reconstructions including MIPs were obtained to evaluate the vascular anatomy.  Contrast: 50mL OMNIPAQUE IOHEXOL 350 MG/ML SOLN  Comparison:  Head CT 07/09.  MRI 07/23/2004.  Findings:  Both internal carotid arteries are widely patent through the siphon.  The carotids are tortuous and there is peripheral calcification but there is no flow-limiting stenosis.  The anterior and middle cerebral vessels are patent bilaterally without proximal stenosis, aneurysm or vascular malformation.  Both vertebral arteries are patent through the foramen magnum with the left being dominant.  There is some calcific plaque of the left vertebral artery at the foramen magnum level with narrowing of 30- 40%.  Both vertebrals are patent to the basilar.  No basilar stenosis.  Posterior circulation branch vessels are patent.  The right posterior cerebral artery receives most of its supply from the anterior circulation.  Parenchymal imaging continues to show atrophy and chronic small vessel change throughout the brain without identifiable acute infarction.  No mass lesion, hemorrhage, hydrocephalus or extra- axial collection.   Review of the MIP images confirms the above findings.  IMPRESSION: Atherosclerotic calcification of the major vessels at the base the brain, but no flow-limiting stenosis or correctable stenosis. Beyond the major vessels at the skull base, the large and medium- sized vessels are  widely patent.  Original Report Authenticated By: Thomasenia Sales, M.D.   Dg Chest 2 View  06/12/2012  *RADIOLOGY REPORT*  Clinical Data: Stroke.  Shortness of breath.  CHEST - 2 VIEW  Comparison: Two-view chest x-ray 05/20/2008, 04/09/2007.  Findings: Cardiac silhouette upper normal in size, unchanged. Hilar and mediastinal contours unremarkable.  Right subclavian dual lead transvenous pacemaker unchanged and appears intact.  Stable minimal linear scarring in the inferior left upper lobe.  Lungs otherwise clear.  Bronchovascular markings normal.  No pleural effusions.  Stable chronic eventration of the right anterior hemidiaphragm.  Degenerative changes involve the thoracic spine. Left shoulder arthroplasty with anatomic alignment.  IMPRESSION: No acute cardiopulmonary disease.  Stable minimal linear scarring in the inferior left upper lobe.  Original Report Authenticated By: Arnell Sieving, M.D.   Ct Head Wo Contrast  06/12/2012  *RADIOLOGY REPORT*  Clinical Data: Aphasia  CT HEAD WITHOUT CONTRAST  Technique:  Contiguous axial images were obtained from the base of the skull through the vertex without contrast.  Comparison: 10/23/2006  Findings: Moderate global atrophy.  Moderate chronic ischemic changes in the periventricular white matter.  No mass effect, midline shift, or acute intracranial hemorrhage.  Left mastoid air cells are clear.  Mucosal thickening in the right ethmoid air cells.  Postoperative changes in the right mastoid air cells.  IMPRESSION: No acute intracranial pathology.  Original Report Authenticated By: Donavan Burnet, M.D.    Microbiology: No results found for this or any previous visit (from the past 240 hour(s)).   Labs: Basic Metabolic Panel:  Lab 06/11/12 1610  NA 142  K 4.2  CL 105  CO2 26  GLUCOSE 90  BUN 12  CREATININE 1.02  CALCIUM 9.4  MG --  PHOS --   Liver Function Tests:  Lab 06/11/12 2336  AST 29  ALT 13  ALKPHOS 84  BILITOT 0.2*  PROT 7.0    ALBUMIN 3.6   CBC:  Lab 06/11/12 2336  WBC 6.7  NEUTROABS 3.1  HGB 9.9*  HCT 31.3*  MCV 88.2  PLT 224   Cardiac Enzymes:  Lab 06/11/12 2336  CKTOTAL --  CKMB --  CKMBINDEX --  TROPONINI <0.30   BNP: No components found with this basename: POCBNP:5 CBG:  Lab 06/12/12 0020  GLUCAP 86    Time coordinating discharge: 30 minutes.   SignedHartley Barefoot  [facility] 06/13/2012, 10:48 AM

## 2012-06-13 NOTE — Progress Notes (Signed)
Stroke Team Progress Note  HISTORY [name] is an 78 y.o. female who was noted by son to have dysarthria when speaking. Per patient, when her son returned home yesterday he noted the end of er sentences seemed dysarthric and to not make sense. Patient felt as though her words would become jumbled and she could not articulate what she wanted to say. This lasted for about 10 minutes and then cleared. Patient was brought to the ED but had cleared by that time. She has a history of atrial fibrillation, status post pacemaker placement 2006. She was on coumadin in past but was found to be a fall risk and taken off the coumadin. She takes ASA as well as Fiorinal on different days at home on a relatively fixed schedule. She is effectively dosed with ASA every day, when accounting for her regular Fiorinal use. At present time, she is back to her baseline.  Patient was not a TPA candidate secondary to resolution of sx. She was admitted for further evaluation and treatment.  SUBJECTIVE Overall she feels her condition is completely resolved. She is ready to go home.  OBJECTIVE Most recent Vital Signs: Filed Vitals:   06/12/12 1818 06/12/12 2200 06/13/12 0200 06/13/12 0600  BP: 123/61 137/98 148/75 130/65  Pulse: 78 82 74 86  Temp: 98 F (36.7 C) 97.8 F (36.6 C) 98.6 F (37 C) 97.9 F (36.6 C)  TempSrc:  Oral Oral Oral  Resp: 20 20 20 20   Height:      Weight:      SpO2: 98% 96% 97% 99%   CBG (last 3)   Basename 06/12/12 0020  GLUCAP 86   Intake/Output from previous day: 07/10 0701 - 07/11 0700 In: 240 [P.O.:240] Out: -   IV Fluid Intake:     MEDICATIONS    . allopurinol  100 mg Oral BID  . aspirin  324 mg Oral Once  . calcitRIOL  0.25 mcg Oral Daily  . clopidogrel  75 mg Oral Q breakfast  . dextromethorphan-guaiFENesin  1 tablet Oral Q12H  . DULoxetine  60 mg Oral Daily  . fesoterodine  4 mg Oral Daily  . fluticasone  2 spray Each Nare Daily  . furosemide  40 mg Oral  Daily  . ipratropium  2 spray Nasal q morning - 10a  . levothyroxine  25 mcg Oral Q0600  . multivitamin with minerals  1 tablet Oral Daily  . omega-3 acid ethyl esters  2 g Oral Q breakfast  . pantoprazole  40 mg Oral Q1200  . potassium chloride SA  20 mEq Oral BID  . simvastatin  40 mg Oral QPM  . sotalol  120 mg Oral BID  . DISCONTD: aspirin  325 mg Oral Daily   PRN:  hydrALAZINE, iohexol, senna-docusate  Diet:  General thin liquids Activity:  OOB DVT Prophylaxis:  SCDs   CLINICALLY SIGNIFICANT STUDIES Basic Metabolic Panel:  Lab 06/11/12 8469  NA 142  K 4.2  CL 105  CO2 26  GLUCOSE 90  BUN 12  CREATININE 1.02  CALCIUM 9.4  MG --  PHOS --   Liver Function Tests:  Lab 06/11/12 2336  AST 29  ALT 13  ALKPHOS 84  BILITOT 0.2*  PROT 7.0  ALBUMIN 3.6   CBC:  Lab 06/11/12 2336  WBC 6.7  NEUTROABS 3.1  HGB 9.9*  HCT 31.3*  MCV 88.2  PLT 224   Coagulation:  Lab 06/11/12 2336  LABPROT 13.2  INR 0.98  Cardiac Enzymes:  Lab 06/11/12 2336  CKTOTAL --  CKMB --  CKMBINDEX --  TROPONINI <0.30   Urinalysis:  Lab 06/12/12 0036  COLORURINE YELLOW  LABSPEC 1.014  PHURINE 6.5  GLUCOSEU NEGATIVE  HGBUR NEGATIVE  BILIRUBINUR NEGATIVE  KETONESUR NEGATIVE  PROTEINUR 30*  UROBILINOGEN 0.2  NITRITE NEGATIVE  LEUKOCYTESUR NEGATIVE   Lipid Panel    Component Value Date/Time   CHOL 177 06/12/2012 0605   TRIG 229* 06/12/2012 0605   HDL 40 06/12/2012 0605   CHOLHDL 4.4 06/12/2012 0605   VLDL 46* 06/12/2012 0605   LDLCALC 91 06/12/2012 0605   HgbA1C  Lab Results  Component Value Date   HGBA1C 5.8* 06/12/2012    Urine Drug Screen:   No results found for this basename: labopia, cocainscrnur, labbenz, amphetmu, thcu, labbarb    Alcohol Level: No results found for this basename: ETH:2 in the last 168 hours  Ct Abdomen Pelvis Wo Contrast  06/12/2012  1.  No acute abnormality seen within the abdomen or pelvis. 2.  Hepatic and renal cysts have increased mildly in  size from 2008; bilateral renal atrophy, worse on the left.  Several of the left renal cysts are hyperdense in nature.  No renal or ureteral stones seen. 3.  Scattered calcification along the abdominal aorta and its branches, particularly along the proximal left renal artery. 4.  Chronic degenerative change along the lumbar spine, with partial chronic loss of height at L4. 5.  Mild nodular densities at the lung bases are stable from 2008 and likely benign. 6.  Mild scattered coronary artery calcifications seen. 7.  Stable intrahepatic biliary ductal dilatation and prominence of the common hepatic duct, likely reflecting the patient's baseline     CT of the brain  06/12/2012   No acute intracranial pathology.   CT angio of the brain  06/12/2012   Atherosclerotic calcification of the major vessels at the base the brain, but no flow-limiting stenosis or correctable stenosis. Beyond the major vessels at the skull base, the large and medium- sized vessels are widely patent  MRI of the brain    MRA of the brain    2D Echocardiogram    Carotid Doppler    CXR  06/12/2012  No acute cardiopulmonary disease.  Stable minimal linear scarring in the inferior left upper lobe.   EKG     Therapy Recommendations PT -HH, OT -none  Physical Exam  Pleasant Caucasian lady not in distress.Awake alert. Afebrile. Head is nontraumatic. Neck is supple without bruit. Hearing is normal. Cardiac exam no murmur or gallop. Lungs are clear to auscultation. Distal pulses are well felt.   Neurological Exam : Awake  Alert oriented x 3. Normal speech and language.eye movements full without nystagmus.fundi not visualized. Visual fields are full to confrontational testing. Vision acuity is adequate. Face symmetric. Tongue midline. Normal strength, tone, reflexes and coordination. Normal sensation. Gait deferred.  ASSESSMENT Ms. [name] is a 78 y.o. female with a left brain TIA with aphasia that resolved. TIA secondary to  atrial fibrillation.  On aspirin 81 mg orally every day prior to admission. Now on clopidogrel 75 mg orally every day for secondary stroke prevention. Patient with no resultant stroke symptoms.  -hypertension -hyperlipidemia on zocor 40, LDL 91 -atrial fibrillation   Hospital day # 2  TREATMENT/PLAN -Add aspirin 81 mg orally every day to plavix for secondary stroke prevention given dx of atrial fibrillation. -no rehab needs -no further stroke workup -ok for discharge from stroke  standpoint -Stroke Service will sign off. Follow up with Dr. Pearlean Brownie in 2 months.   Joaquin Music, ANP-BC, GNP-BC [facility] Pager: [phone] 06/13/2012 10:36 AM  Dr. Delia Heady, Stroke Center Medical Director, has personally reviewed chart, pertinent data, examined the patient and developed the plan of care. Pager:  [phone]

## 2012-06-13 NOTE — Progress Notes (Signed)
Physical Therapy Treatment Patient Details Name: [name] MRN: [id number] DOB: 29-Sep-1931 Today's Date: 06/13/2012 Time: 6578-4696 PT Time Calculation (min): 19 min  PT Assessment / Plan / Recommendation Comments on Treatment Session  Pt admitted s/p ? TIA and continues to progress.  Pt able to tolerate ambulation with increased independence today.  Limited by fatigue.    Follow Up Recommendations  Home health PT    Barriers to Discharge        Equipment Recommendations  None recommended by PT    Recommendations for Other Services    Frequency Min 4X/week   Plan Discharge plan remains appropriate;Frequency remains appropriate    Precautions / Restrictions Precautions Precautions: None Restrictions Weight Bearing Restrictions: No   Pertinent Vitals/Pain None    Mobility  Bed Mobility Bed Mobility: Sit to Supine Sit to Supine: 6: Modified independent (Device/Increase time) Transfers Transfers: Sit to Stand;Stand to Sit Sit to Stand: 5: Supervision;With upper extremity assist;From bed Stand to Sit: 5: Supervision;With upper extremity assist;To bed Details for Transfer Assistance: Verbal cues for safest  Ambulation/Gait Ambulation/Gait Assistance: 4: Min guard Ambulation Distance (Feet): 50 Feet Assistive device: Rolling walker Ambulation/Gait Assistance Details: Guarding for balance with cues for tall posture and safe sequence inside RW.  Distance limited by fatigue. Gait Pattern: Decreased stride length;Trunk flexed;Shuffle Stairs: No Wheelchair Mobility Wheelchair Mobility: No Modified Rankin (Stroke Patients Only) Pre-Morbid Rankin Score: No symptoms Modified Rankin: Moderate disability    Exercises     PT Diagnosis:    PT Problem List:   PT Treatment Interventions:     PT Goals Acute Rehab PT Goals PT Goal Formulation: With patient Time For Goal Achievement: 06/19/12 Potential to Achieve Goals: Good PT Goal: Sit to Supine/Side - Progress: Met PT  Goal: Sit to Stand - Progress: Progressing toward goal PT Goal: Stand to Sit - Progress: Progressing toward goal PT Goal: Ambulate - Progress: Progressing toward goal  Visit Information  Last PT Received On: 06/13/12 Assistance Needed: +1    Subjective Data  Subjective: "I just don't know how much I can do." Patient Stated Goal: Go home.   Cognition  Overall Cognitive Status: Appears within functional limits for tasks assessed/performed Arousal/Alertness: Awake/alert Orientation Level: Appears intact for tasks assessed Behavior During Session: [facility] for tasks performed    Balance  Balance Balance Assessed: No  End of Session PT - End of Session Equipment Utilized During Treatment: Gait belt Activity Tolerance: Patient tolerated treatment well Patient left: in bed;with call bell/phone within reach;with bed alarm set Nurse Communication: Mobility status   GP     Cephus Shelling 06/13/2012, 9:22 AM  06/13/2012 Cephus Shelling, PT, DPT [phone]

## 2012-07-31 ENCOUNTER — Other Ambulatory Visit: Payer: Self-pay | Admitting: Gastroenterology

## 2012-07-31 DIAGNOSIS — R1310 Dysphagia, unspecified: Secondary | ICD-10-CM

## 2012-08-16 ENCOUNTER — Other Ambulatory Visit: Payer: Medicare Other

## 2012-08-23 ENCOUNTER — Other Ambulatory Visit: Payer: Medicare Other

## 2012-09-02 ENCOUNTER — Other Ambulatory Visit: Payer: Medicare Other

## 2012-09-03 ENCOUNTER — Observation Stay (HOSPITAL_COMMUNITY)
Admission: EM | Admit: 2012-09-03 | Discharge: 2012-09-07 | Disposition: A | Discharge status: Home / Self Care | Payer: Medicare Other | Attending: Internal Medicine | Admitting: Internal Medicine

## 2012-09-03 ENCOUNTER — Emergency Department (HOSPITAL_COMMUNITY): Payer: Medicare Other

## 2012-09-03 ENCOUNTER — Encounter (HOSPITAL_COMMUNITY): Payer: Self-pay | Admitting: Emergency Medicine

## 2012-09-03 DIAGNOSIS — I4891 Unspecified atrial fibrillation: Secondary | ICD-10-CM | POA: Insufficient documentation

## 2012-09-03 DIAGNOSIS — E785 Hyperlipidemia, unspecified: Secondary | ICD-10-CM | POA: Insufficient documentation

## 2012-09-03 DIAGNOSIS — R51 Headache: Secondary | ICD-10-CM | POA: Insufficient documentation

## 2012-09-03 DIAGNOSIS — D509 Iron deficiency anemia, unspecified: Secondary | ICD-10-CM | POA: Insufficient documentation

## 2012-09-03 DIAGNOSIS — Z23 Encounter for immunization: Secondary | ICD-10-CM | POA: Insufficient documentation

## 2012-09-03 DIAGNOSIS — R3129 Other microscopic hematuria: Secondary | ICD-10-CM | POA: Insufficient documentation

## 2012-09-03 DIAGNOSIS — X58XXXA Exposure to other specified factors, initial encounter: Secondary | ICD-10-CM | POA: Insufficient documentation

## 2012-09-03 DIAGNOSIS — I10 Essential (primary) hypertension: Secondary | ICD-10-CM | POA: Insufficient documentation

## 2012-09-03 DIAGNOSIS — IMO0002 Reserved for concepts with insufficient information to code with codable children: Secondary | ICD-10-CM | POA: Insufficient documentation

## 2012-09-03 DIAGNOSIS — D649 Anemia, unspecified: Secondary | ICD-10-CM

## 2012-09-03 DIAGNOSIS — K219 Gastro-esophageal reflux disease without esophagitis: Secondary | ICD-10-CM | POA: Insufficient documentation

## 2012-09-03 DIAGNOSIS — R1310 Dysphagia, unspecified: Secondary | ICD-10-CM | POA: Insufficient documentation

## 2012-09-03 DIAGNOSIS — Z950 Presence of cardiac pacemaker: Secondary | ICD-10-CM | POA: Insufficient documentation

## 2012-09-03 DIAGNOSIS — E876 Hypokalemia: Secondary | ICD-10-CM | POA: Insufficient documentation

## 2012-09-03 DIAGNOSIS — N390 Urinary tract infection, site not specified: Secondary | ICD-10-CM | POA: Insufficient documentation

## 2012-09-03 DIAGNOSIS — G9341 Metabolic encephalopathy: Secondary | ICD-10-CM

## 2012-09-03 DIAGNOSIS — R4182 Altered mental status, unspecified: Secondary | ICD-10-CM

## 2012-09-03 DIAGNOSIS — R4701 Aphasia: Secondary | ICD-10-CM | POA: Insufficient documentation

## 2012-09-03 DIAGNOSIS — G459 Transient cerebral ischemic attack, unspecified: Principal | ICD-10-CM | POA: Insufficient documentation

## 2012-09-03 HISTORY — DX: Transient cerebral ischemic attack, unspecified: G45.9

## 2012-09-03 LAB — URINALYSIS, ROUTINE W REFLEX MICROSCOPIC
Bilirubin Urine: NEGATIVE
Glucose, UA: NEGATIVE mg/dL
Ketones, ur: NEGATIVE mg/dL
pH: 5.5 (ref 5.0–8.0)

## 2012-09-03 LAB — CBC
HCT: 30.1 % — ABNORMAL LOW (ref 36.0–46.0)
MCV: 94.1 fL (ref 78.0–100.0)
RBC: 3.2 MIL/uL — ABNORMAL LOW (ref 3.87–5.11)
WBC: 6 10*3/uL (ref 4.0–10.5)

## 2012-09-03 LAB — GRAM STAIN

## 2012-09-03 LAB — BASIC METABOLIC PANEL
BUN: 15 mg/dL (ref 6–23)
CO2: 28 mEq/L (ref 19–32)
Chloride: 104 mEq/L (ref 96–112)
Creatinine, Ser: 1.11 mg/dL — ABNORMAL HIGH (ref 0.50–1.10)

## 2012-09-03 LAB — URINE MICROSCOPIC-ADD ON

## 2012-09-03 MED ORDER — DM-GUAIFENESIN ER 30-600 MG PO TB12
1.0000 | ORAL_TABLET | Freq: Two times a day (BID) | ORAL | Status: DC
  Administered 2012-09-04 – 2012-09-07 (×6): 1 via ORAL
  Filled 2012-09-03 (×9): qty 1

## 2012-09-03 MED ORDER — CLOPIDOGREL BISULFATE 75 MG PO TABS
75.0000 mg | ORAL_TABLET | Freq: Every day | ORAL | Status: DC
  Administered 2012-09-04 – 2012-09-07 (×4): 75 mg via ORAL
  Filled 2012-09-03 (×6): qty 1

## 2012-09-03 MED ORDER — SOTALOL HCL 120 MG PO TABS
120.0000 mg | ORAL_TABLET | Freq: Two times a day (BID) | ORAL | Status: DC
  Administered 2012-09-04 – 2012-09-07 (×7): 120 mg via ORAL
  Filled 2012-09-03 (×10): qty 1

## 2012-09-03 MED ORDER — ADULT MULTIVITAMIN W/MINERALS CH
1.0000 | ORAL_TABLET | Freq: Every day | ORAL | Status: DC
  Administered 2012-09-04 – 2012-09-07 (×4): 1 via ORAL
  Filled 2012-09-03 (×4): qty 1

## 2012-09-03 MED ORDER — IPRATROPIUM BROMIDE 0.06 % NA SOLN
2.0000 | Freq: Every morning | NASAL | Status: DC
  Administered 2012-09-04 – 2012-09-07 (×4): 2 via NASAL
  Filled 2012-09-03: qty 15

## 2012-09-03 MED ORDER — FESOTERODINE FUMARATE ER 8 MG PO TB24
8.0000 mg | ORAL_TABLET | Freq: Every day | ORAL | Status: DC
  Administered 2012-09-04 – 2012-09-07 (×3): 8 mg via ORAL
  Filled 2012-09-03 (×8): qty 1

## 2012-09-03 MED ORDER — DULOXETINE HCL 60 MG PO CPEP
60.0000 mg | ORAL_CAPSULE | Freq: Every day | ORAL | Status: DC
  Administered 2012-09-04 – 2012-09-07 (×4): 60 mg via ORAL
  Filled 2012-09-03 (×5): qty 1

## 2012-09-03 MED ORDER — OMEGA-3-ACID ETHYL ESTERS 1 G PO CAPS
2.0000 g | ORAL_CAPSULE | Freq: Every day | ORAL | Status: DC
  Administered 2012-09-04 – 2012-09-07 (×3): 2 g via ORAL
  Filled 2012-09-03 (×4): qty 2

## 2012-09-03 MED ORDER — PANTOPRAZOLE SODIUM 40 MG PO TBEC
40.0000 mg | DELAYED_RELEASE_TABLET | Freq: Two times a day (BID) | ORAL | Status: DC
  Administered 2012-09-04 – 2012-09-07 (×6): 40 mg via ORAL
  Filled 2012-09-03 (×7): qty 1

## 2012-09-03 MED ORDER — ALLOPURINOL 100 MG PO TABS
100.0000 mg | ORAL_TABLET | Freq: Two times a day (BID) | ORAL | Status: DC
  Administered 2012-09-04 – 2012-09-07 (×6): 100 mg via ORAL
  Filled 2012-09-03 (×10): qty 1

## 2012-09-03 MED ORDER — LEVOTHYROXINE SODIUM 25 MCG PO TABS
25.0000 ug | ORAL_TABLET | Freq: Every day | ORAL | Status: DC
  Administered 2012-09-04 – 2012-09-07 (×4): 25 ug via ORAL
  Filled 2012-09-03 (×5): qty 1

## 2012-09-03 MED ORDER — ASPIRIN EC 81 MG PO TBEC
81.0000 mg | DELAYED_RELEASE_TABLET | Freq: Every day | ORAL | Status: DC
  Administered 2012-09-04 – 2012-09-07 (×4): 81 mg via ORAL
  Filled 2012-09-03 (×5): qty 1

## 2012-09-03 MED ORDER — SIMVASTATIN 40 MG PO TABS
40.0000 mg | ORAL_TABLET | Freq: Every evening | ORAL | Status: DC
  Administered 2012-09-04 – 2012-09-06 (×3): 40 mg via ORAL
  Filled 2012-09-03 (×5): qty 1

## 2012-09-03 MED ORDER — POTASSIUM CHLORIDE ER 10 MEQ PO TBCR
10.0000 meq | EXTENDED_RELEASE_TABLET | Freq: Two times a day (BID) | ORAL | Status: DC
  Administered 2012-09-04 – 2012-09-07 (×6): 10 meq via ORAL
  Filled 2012-09-03 (×9): qty 1

## 2012-09-03 MED ORDER — CALCITRIOL 0.25 MCG PO CAPS
0.2500 ug | ORAL_CAPSULE | Freq: Every day | ORAL | Status: DC
  Administered 2012-09-04 – 2012-09-07 (×4): 0.25 ug via ORAL
  Filled 2012-09-03 (×5): qty 1

## 2012-09-03 NOTE — ED Notes (Signed)
PT. ARRIVED WITH EMS FROM HOME , FAMILY REPORTS BRIEF EPISODE OF EXPRESSIVE APHASIA THIS AFTERNOON ( APPROX. 5 PM) , ALERT AND ORIENTED AT ARRIVAL , SPEECH CLEAR , NO FACIAL ASYMMETRY , EQUAL GRIPS , NO ARM OR LEG DRIFT. DENIES PAIN . RESPIRATIONS UNLABORED.  HISTORY OF TIA .

## 2012-09-03 NOTE — ED Provider Notes (Signed)
History     CSN: [id number]  Arrival date & time 09/03/12  1929   First MD Initiated Contact with Patient 09/03/12 1953      Chief Complaint  Patient presents with  . Aphasia    (Consider location/radiation/quality/duration/timing/severity/associated sxs/prior treatment) Patient is a 78 y.o. female presenting with neurologic complaint. The history is provided by the patient and a relative.  Neurologic Problem The primary symptoms include headaches and speech change. Primary symptoms do not include syncope, loss of consciousness, altered mental status, seizures, dizziness, visual change, paresthesias, focal weakness, loss of sensation, memory loss, fever, nausea or vomiting. The symptoms began 2 to 6 hours ago. The episode lasted 30 minutes. The symptoms are resolved. The neurological symptoms are focal. Context: while talking on the phone.  The headache began more than 2 days ago. The headache developed gradually. Headache is a recurrent problem. The headache is present intermittently. The headache is not associated with visual change, neck stiffness, paresthesias, weakness or loss of balance.  Change in speech began 3 -  6 hours ago. Features of the speech change include inability to articulate, inability to speak fluently, inability to name and incorrect word choice.  Additional symptoms do not include neck stiffness, weakness or loss of balance. Associated medical issues comments: TIA. Workup history includes CT scan and carotid ultrasound.    Past Medical History  Diagnosis Date  . Hypertension   . Hyperlipidemia   . GERD (gastroesophageal reflux disease)   . Dysphagia     appt with Molly Maduro Buccini upcoming  . Chronic recurrent sinusitis   . Recurrent UTI   . Atrial fibrillation     ???  . Arthritis   . TIA (transient ischemic attack)     Past Surgical History  Procedure Date  . Pacemaker insertion   . Back surgery 1985  . Shoulder surgery 2008    Left  (Dr. Ranell Patrick)  .  Total knee arthroplasty 2000, 2001    bilateral (Dr. Despina Hick), left 2000, right 2001  . Cataract extraction 1986, 2003    left 1986, right 2003  . Vaginal hysterectomy 1969  . Bilateral salpingoophorectomy 1971  . Tonsillectomy 1951  . Appendectomy 1952  . Mastoidectomy 1970  . Sinus exploration 1972  . Knee arthroscopy 1988, 1995, 2000  . Hemorrhoid surgery 2009  . Repair / reconstruction interphalangeal joint 2004    R thumb  . Cystoscopy 1992  . Insert / replace / remove pacemaker     No family history on file.  History  Substance Use Topics  . Smoking status: Former Smoker -- 1.0 packs/day for 15 years    Types: Cigarettes    Quit date: 12/04/1981  . Smokeless tobacco: Not on file  . Alcohol Use: No    OB History    Grav Para Term Preterm Abortions TAB SAB Ect Mult Living                  Review of Systems  Constitutional: Negative for fever.  HENT: Negative for neck stiffness.   Respiratory: Negative for cough and shortness of breath.   Cardiovascular: Negative for chest pain and syncope.  Gastrointestinal: Negative for nausea, vomiting, abdominal pain and diarrhea.  Neurological: Positive for speech change and headaches. Negative for dizziness, focal weakness, seizures, loss of consciousness, weakness, paresthesias and loss of balance.  Psychiatric/Behavioral: Negative for memory loss and altered mental status.  All other systems reviewed and are negative.    Allergies  Review of patient's allergies  indicates no known allergies.  Home Medications   Current Outpatient Rx  Name Route Sig Dispense Refill  . ALLOPURINOL 100 MG PO TABS Oral Take 100 mg by mouth 2 (two) times daily.    . ASPIRIN EC 81 MG PO TBEC Oral Take 1 tablet (81 mg total) by mouth daily. 1 tablet 12  . CALCITRIOL 0.25 MCG PO CAPS Oral Take 0.25 mcg by mouth daily.    Marland Kitchen CLOPIDOGREL BISULFATE 75 MG PO TABS Oral Take 1 tablet (75 mg total) by mouth daily with breakfast. 30 tablet 0  .  DM-GUAIFENESIN ER 30-600 MG PO TB12 Oral Take 1 tablet by mouth every 12 (twelve) hours.    . DULOXETINE HCL 60 MG PO CPEP Oral Take 60 mg by mouth daily.    . FUROSEMIDE 40 MG PO TABS Oral Take 40 mg by mouth daily.    . IPRATROPIUM BROMIDE 0.06 % NA SOLN Nasal Place 2 sprays into the nose every morning.    Marland Kitchen LEVOTHYROXINE SODIUM 25 MCG PO TABS Oral Take 25 mcg by mouth daily.    . ADULT MULTIVITAMIN W/MINERALS CH Oral Take 1 tablet by mouth daily.    . OMEGA-3-ACID ETHYL ESTERS 1 G PO CAPS Oral Take 2 g by mouth daily.    Marland Kitchen PANTOPRAZOLE SODIUM 40 MG PO TBEC Oral Take 40 mg by mouth 2 (two) times daily.    Marland Kitchen POTASSIUM CHLORIDE ER 10 MEQ PO TBCR Oral Take 10 mEq by mouth 2 (two) times daily.    Marland Kitchen SIMVASTATIN 40 MG PO TABS Oral Take 40 mg by mouth every evening.    Marland Kitchen SOTALOL HCL 120 MG PO TABS Oral Take 120 mg by mouth 2 (two) times daily.    . TOLTERODINE TARTRATE ER 4 MG PO CP24 Oral Take 4 mg by mouth at bedtime.      BP 169/78  Pulse 76  Temp 98.2 F (36.8 C) (Oral)  Resp 15  SpO2 99%  Physical Exam  Nursing note and vitals reviewed. Constitutional: She is oriented to person, place, and time. She appears well-developed and well-nourished. No distress.  HENT:  Head: Normocephalic and atraumatic.  Eyes: EOM are normal. Pupils are equal, round, and reactive to light.  Neck: Normal range of motion.  Cardiovascular: Normal rate and normal heart sounds.   Pulmonary/Chest: Effort normal and breath sounds normal. No respiratory distress.  Abdominal: Soft. She exhibits no distension. There is no tenderness.  Musculoskeletal: Normal range of motion.  Neurological: She is alert and oriented to person, place, and time. No cranial nerve deficit. She exhibits normal muscle tone. Coordination normal.  Skin: Skin is warm and dry.    ED Course  Procedures (including critical care time)  Labs Reviewed  CBC - Abnormal; Notable for the following:    RBC 3.20 (*)     Hemoglobin 9.8 (*)      HCT 30.1 (*)     RDW 17.7 (*)     All other components within normal limits  BASIC METABOLIC PANEL - Abnormal; Notable for the following:    Creatinine, Ser 1.11 (*)     GFR calc non Af Amer 46 (*)     GFR calc Af Amer 53 (*)     All other components within normal limits  URINALYSIS, ROUTINE W REFLEX MICROSCOPIC - Abnormal; Notable for the following:    APPearance CLOUDY (*)     Hgb urine dipstick SMALL (*)     Leukocytes, UA SMALL (*)  All other components within normal limits  URINE MICROSCOPIC-ADD ON - Abnormal; Notable for the following:    Squamous Epithelial / LPF FEW (*)     All other components within normal limits  GRAM STAIN  URINE CULTURE   Ct Head Wo Contrast  09/03/2012  *RADIOLOGY REPORT*  Clinical Data: Expressive aphasia.  Headaches.  CT HEAD WITHOUT CONTRAST  Technique:  Contiguous axial images were obtained from the base of the skull through the vertex without contrast.  Comparison: None.  Findings: The brain stem, cerebellum, cerebral peduncles, thalami, basal ganglia, basilar cisterns, and ventricular system appear unremarkable.  Periventricular and corona radiata white matter hypodensities are most compatible with chronic ischemic microvascular white matter disease.  No intracranial hemorrhage, mass lesion, or acute infarction is identified.  Chronic right ethmoid sinusitis noted.  IMPRESSION:  1.  Chronic right ethmoid sinusitis. 2. Periventricular and corona radiata white matter hypodensities are most compatible with chronic ischemic microvascular white matter disease.  3.  No acute findings.   Original Report Authenticated By: Dellia Cloud, M.D.      1. TIA on medication       MDM  8:15 PM Pt seen and examined. Pt with two episodes of expressive aphasia today. PT with history of similar that was thought to be due to a TIA several months ago. Pt with intermittent headaches but unable to correlate these to not being able to talk. Concern for TIA likely  as all symptoms have resolved. Will get CT head and labs.  Pt without acute change on head CT. Will admit for observation as patient had two episodes today. Pt already on max medical therapy (ASA and plavix) but feel that patient is at risk of converting to CVA and therefore deserves a period of observation.        Daleen Bo, MD 09/03/12 [phone]

## 2012-09-03 NOTE — ED Notes (Signed)
Per lab tech, urine gram stain results as follows: WBC (poly and mono), gram (+) cocci, gram (-) rods

## 2012-09-03 NOTE — H&P (Addendum)
[facility] History and Physical  [name]:[id number] DOB: Mar 10, 1931 DOA: 09/03/2012  Referring physician: ED PCP: Sheila Oats, MD   Chief Complaint: Episodes of Aphasia, TIA, monitor for CVA conversion  HPI: [name] is a 78 y.o. female who unfortunately had 2 episodes earlier this evening of expressive aphasia.  Patient notes she had difficulty with word finding.  The first episode lasted for about 30 mins, and the second episode was noted to last about 5 mins after EMS had arrived.  The patient has had similar episodes in the past few weeks and there is concern that she is having multiple TIAs which may culminate ultimately in a CVA.  This diagnosis cannot be confirmed via MRI however due to a history of pacemaker placement.  Thankfully after arriving at the ED the patient was noted to be completely asymptomatic with no residual neurologic findings.  [facility] Hospitalist service has been asked to admit the patient for observation and neuro checks as she would potentially be a candidate for TPA if she were to develop a full CVA symptomatically.  Review of Systems: 12 systems reviewed and negative except as per HPI.  Past Medical History  Diagnosis Date  . Hypertension   . Hyperlipidemia   . GERD (gastroesophageal reflux disease)   . Dysphagia     appt with Molly Maduro Buccini upcoming  . Chronic recurrent sinusitis   . Recurrent UTI   . Atrial fibrillation     ???  . Arthritis   . TIA (transient ischemic attack)    Past Surgical History  Procedure Date  . Pacemaker insertion   . Back surgery 1985  . Shoulder surgery 2008    Left  (Dr. Ranell Patrick)  . Total knee arthroplasty 2000, 2001    bilateral (Dr. Despina Hick), left 2000, right 2001  . Cataract extraction 1986, 2003    left 1986, right 2003  . Vaginal hysterectomy 1969  . Bilateral salpingoophorectomy 1971  . Tonsillectomy 1951  . Appendectomy 1952  . Mastoidectomy 1970  . Sinus exploration 1972  .  Knee arthroscopy 1988, 1995, 2000  . Hemorrhoid surgery 2009  . Repair / reconstruction interphalangeal joint 2004    R thumb  . Cystoscopy 1992  . Insert / replace / remove pacemaker    Social History:  reports that she quit smoking about 30 years ago. Her smoking use included Cigarettes. She has a 15 pack-year smoking history. She does not have any smokeless tobacco history on file. She reports that she does not drink alcohol or use illicit drugs.   No Known Allergies  No family history on file.  Prior to Admission medications   Medication Sig Start Date End Date Taking? Authorizing Provider  allopurinol (ZYLOPRIM) 100 MG tablet Take 100 mg by mouth 2 (two) times daily.   Yes Historical Provider, MD  aspirin EC 81 MG tablet Take 1 tablet (81 mg total) by mouth daily. 06/13/12 06/13/13 Yes Belkys A Regalado, MD  calcitRIOL (ROCALTROL) 0.25 MCG capsule Take 0.25 mcg by mouth daily.   Yes Historical Provider, MD  clopidogrel (PLAVIX) 75 MG tablet Take 1 tablet (75 mg total) by mouth daily with breakfast. 06/13/12 06/13/13 Yes Belkys A Regalado, MD  dextromethorphan-guaiFENesin (MUCINEX DM) 30-600 MG per 12 hr tablet Take 1 tablet by mouth every 12 (twelve) hours.   Yes Historical Provider, MD  DULoxetine (CYMBALTA) 60 MG capsule Take 60 mg by mouth daily.   Yes Historical Provider, MD  furosemide (LASIX) 40 MG tablet Take  40 mg by mouth daily.   Yes Historical Provider, MD  ipratropium (ATROVENT) 0.06 % nasal spray Place 2 sprays into the nose every morning.   Yes Historical Provider, MD  levothyroxine (SYNTHROID, LEVOTHROID) 25 MCG tablet Take 25 mcg by mouth daily.   Yes Historical Provider, MD  Multiple Vitamin (MULTIVITAMIN WITH MINERALS) TABS Take 1 tablet by mouth daily.   Yes Historical Provider, MD  omega-3 acid ethyl esters (LOVAZA) 1 G capsule Take 2 g by mouth daily.   Yes Historical Provider, MD  pantoprazole (PROTONIX) 40 MG tablet Take 40 mg by mouth 2 (two) times daily.   Yes  Historical Provider, MD  potassium chloride (K-DUR) 10 MEQ tablet Take 10 mEq by mouth 2 (two) times daily.   Yes Historical Provider, MD  simvastatin (ZOCOR) 40 MG tablet Take 40 mg by mouth every evening.   Yes Historical Provider, MD  sotalol (BETAPACE) 120 MG tablet Take 120 mg by mouth 2 (two) times daily.   Yes Historical Provider, MD  tolterodine (DETROL LA) 4 MG 24 hr capsule Take 4 mg by mouth at bedtime.   Yes Historical Provider, MD   Physical Exam: Filed Vitals:   09/03/12 1938 09/03/12 2130  BP: 169/78 165/90  Pulse: 76 84  Temp: 98.2 F (36.8 C)   TempSrc: Oral   Resp: 15 20  SpO2: 99% 96%     General:  NAD, resting comfortably in hospital bed  Eyes: PEERLA EOMI  ENT: mucous membranes moist, toung protrusion midline  Neck: supple, w/o JVD  Cardiovascular: RRR w/o MRG  Respiratory: CTA B  Abdomen: soft, nt, nd, bs+  Skin: does have a skin lesion on LLE where she states she fell earlier at home  Musculoskeletal: 5/5 strength all 4 extremities, MAE, ROM full  Neurologic: AAOx3, mentation intact, speech clear, grossly non-focal exam at this point in time  Labs on Admission:  Basic Metabolic Panel:  Lab 09/03/12 6578  NA 140  K 4.0  CL 104  CO2 28  GLUCOSE 99  BUN 15  CREATININE 1.11*  CALCIUM 9.4  MG --  PHOS --   Liver Function Tests: No results found for this basename: AST:5,ALT:5,ALKPHOS:5,BILITOT:5,PROT:5,ALBUMIN:5 in the last 168 hours No results found for this basename: LIPASE:5,AMYLASE:5 in the last 168 hours No results found for this basename: AMMONIA:5 in the last 168 hours CBC:  Lab 09/03/12 2042  WBC 6.0  NEUTROABS --  HGB 9.8*  HCT 30.1*  MCV 94.1  PLT 203   Cardiac Enzymes: No results found for this basename: CKTOTAL:5,CKMB:5,CKMBINDEX:5,TROPONINI:5 in the last 168 hours  BNP (last 3 results) No results found for this basename: PROBNP:3 in the last 8760 hours CBG: No results found for this basename: GLUCAP:5 in the last  168 hours  Radiological Exams on Admission: Ct Head Wo Contrast  09/03/2012  *RADIOLOGY REPORT*  Clinical Data: Expressive aphasia.  Headaches.  CT HEAD WITHOUT CONTRAST  Technique:  Contiguous axial images were obtained from the base of the skull through the vertex without contrast.  Comparison: None.  Findings: The brain stem, cerebellum, cerebral peduncles, thalami, basal ganglia, basilar cisterns, and ventricular system appear unremarkable.  Periventricular and corona radiata white matter hypodensities are most compatible with chronic ischemic microvascular white matter disease.  No intracranial hemorrhage, mass lesion, or acute infarction is identified.  Chronic right ethmoid sinusitis noted.  IMPRESSION:  1.  Chronic right ethmoid sinusitis. 2. Periventricular and corona radiata white matter hypodensities are most compatible with chronic ischemic microvascular  white matter disease.  3.  No acute findings.   Original Report Authenticated By: Dellia Cloud, M.D.     EKG: Independently reviewed.  Assessment/Plan Principal Problem:  *TIA (transient ischemic attack)   1. TIA - Appears to be patients 2nd TIA since July, patient does have a PMH of A.fib according to her records but had to DC coumadin due to frequent falls.  Plan to admit patient, monitor closely with neuro checks, cannot obtain MRI brain, secondary to h/o pacemaker placement, patient appears to be in sinus rhythm paced at this point in time.  If patient develops new neuro symptoms then should call neurology emergently for evaluation as a TPA candidate.  Will order carotid US, will hold off on CT angio at this point in time. 2. A.Fib - continue home meds as well as ASA plavix at this point in time, not candidate for coumadin due to falls. 3. H/o HTN - allowing permissive HTN during acute phase of TIA 4. H/o GERD, dyslipidemia - continue home PPI and statin, recheck lipids per stroke protocol 5. Microscopic hematuria - seen  during previous admit as well, CT of abd and pelvis was negative at that time. 6. Iorn deficiency anemia - appears stable from previous admit, continue home meds.  Code Status: Full Code Family Communication: Spoke with patient and son who was present in room Disposition Plan: Admit to inpatient  Time spent: 50 min  GARDNER, JARED M. [facility] Pager [phone]  If 7PM-7AM, please contact night-coverage [email or web address] Password [email or web address] 09/03/2012, 11:28 PM

## 2012-09-04 ENCOUNTER — Encounter (HOSPITAL_COMMUNITY): Payer: Self-pay | Admitting: *Deleted

## 2012-09-04 ENCOUNTER — Observation Stay (HOSPITAL_COMMUNITY): Payer: Medicare Other

## 2012-09-04 DIAGNOSIS — E785 Hyperlipidemia, unspecified: Secondary | ICD-10-CM

## 2012-09-04 DIAGNOSIS — G459 Transient cerebral ischemic attack, unspecified: Secondary | ICD-10-CM

## 2012-09-04 DIAGNOSIS — I10 Essential (primary) hypertension: Secondary | ICD-10-CM

## 2012-09-04 DIAGNOSIS — D649 Anemia, unspecified: Secondary | ICD-10-CM

## 2012-09-04 LAB — HEMOGLOBIN A1C: Mean Plasma Glucose: 114 mg/dL (ref <117)

## 2012-09-04 LAB — LIPID PANEL
Total CHOL/HDL Ratio: 5 RATIO
VLDL: 49 mg/dL — ABNORMAL HIGH (ref 0–40)

## 2012-09-04 MED ORDER — BACITRACIN-NEOMYCIN-POLYMYXIN OINTMENT TUBE
TOPICAL_OINTMENT | Freq: Two times a day (BID) | CUTANEOUS | Status: DC
  Administered 2012-09-04 – 2012-09-05 (×3): via TOPICAL
  Administered 2012-09-05: 1 via TOPICAL
  Administered 2012-09-06 – 2012-09-07 (×3): via TOPICAL
  Filled 2012-09-04: qty 15

## 2012-09-04 MED ORDER — HYDROCODONE-ACETAMINOPHEN 5-325 MG PO TABS
1.0000 | ORAL_TABLET | Freq: Four times a day (QID) | ORAL | Status: DC | PRN
  Administered 2012-09-04: 2 via ORAL
  Filled 2012-09-04: qty 2

## 2012-09-04 MED ORDER — INFLUENZA VIRUS VACC SPLIT PF IM SUSP
0.5000 mL | INTRAMUSCULAR | Status: AC
  Administered 2012-09-04: 0.5 mL via INTRAMUSCULAR
  Filled 2012-09-04: qty 0.5

## 2012-09-04 MED ORDER — ENOXAPARIN SODIUM 30 MG/0.3ML ~~LOC~~ SOLN
30.0000 mg | SUBCUTANEOUS | Status: DC
  Administered 2012-09-04 – 2012-09-06 (×3): 30 mg via SUBCUTANEOUS
  Filled 2012-09-04 (×3): qty 0.3

## 2012-09-04 NOTE — Evaluation (Signed)
Physical Therapy Evaluation Patient Details Name: [name] MRN: [id number] DOB: 26-Jul-1931 Today's Date: 09/04/2012 Time: 1410-1440 PT Time Calculation (min): 30 min  PT Assessment / Plan / Recommendation Clinical Impression  [name] is 78 y/o female admitted with aphasia which has since cleared. TIA vs CVA work up underway. Presents to PT today with generalized weakness and balance deficits that appear to be baseline which limited her safety with gait and make her a fall risk. Will benefit physical thearpy in the acute setting to address these and the below impairments so as to maximize safety in prep for d/c home. Rec HHPT and supervision for all mobility/OOB.     PT Assessment  Patient needs continued PT services    Follow Up Recommendations  Home health PT;Supervision for mobility/OOB    Barriers to Discharge        Equipment Recommendations  None recommended by PT    Recommendations for Other Services     Frequency Min 4X/week    Precautions / Restrictions Precautions Precautions: Fall   Pertinent Vitals/Pain Recent fall with laceration to left shin      Mobility  Bed Mobility Bed Mobility: Scooting to HOB Supine to Sit: 6: Modified independent (Device/Increase time) Sit to Supine: 6: Modified independent (Device/Increase time) Scooting to [facility]: 6: Modified independent (Device/Increase time) Transfers Transfers: Sit to Stand;Stand to Sit Sit to Stand: 4: Min guard;With upper extremity assist;From bed Stand to Sit: 4: Min guard;With upper extremity assist;To bed Details for Transfer Assistance: verbal cues for safe hand placement (pt pulling on RW); cues for tall posture; pt independently verbalizing safe technique in prep to sit and then reports "see, I know what to do, I just don't do it" Ambulation/Gait Ambulation/Gait Assistance: 4: Min guard Ambulation Distance (Feet): 150 Feet Assistive device: Rolling walker Ambulation/Gait Assistance Details:  close gaurding for stability, cues for attention to spacing with RW (pt almost catching her foot on RW) Gait Pattern: Trunk flexed Gait velocity: slow General Gait Details: LLE turned out, very flexed trunk pushign RW too far ahead  Stairs: No              PT Diagnosis: Difficulty walking;Abnormality of gait;Generalized weakness  PT Problem List: Decreased strength;Decreased activity tolerance;Decreased balance;Decreased mobility;Pain;Decreased knowledge of use of DME;Decreased safety awareness PT Treatment Interventions: DME instruction;Gait training;Stair training;Functional mobility training;Therapeutic activities;Therapeutic exercise;Balance training;Neuromuscular re-education;Patient/family education   PT Goals Acute Rehab PT Goals PT Goal Formulation: With patient Time For Goal Achievement: 09/11/12 Potential to Achieve Goals: Good Pt will go Sit to Stand: with modified independence PT Goal: Sit to Stand - Progress: Goal set today Pt will go Stand to Sit: with modified independence PT Goal: Stand to Sit - Progress: Goal set today Pt will Transfer Bed to Chair/Chair to Bed: with modified independence PT Transfer Goal: Bed to Chair/Chair to Bed - Progress: Goal set today Pt will Ambulate: >150 feet;with modified independence;with least restrictive assistive device PT Goal: Ambulate - Progress: Goal set today Pt will Go Up / Down Stairs: Flight;with supervision;with rail(s) PT Goal: Up/Down Stairs - Progress: Goal set today  Visit Information  Last PT Received On: 09/04/12 Assistance Needed: +1    Subjective Data  Subjective: Well I am just dead tired, I didn't sleep a wink last night and I am not going to be doing much with you.    Prior Functioning  Home Living Lives With: Son Available Help at Discharge: Family;Available 24 hours/day Type of Home: House Home Access: Stairs to  enter Entrance Stairs-Number of Steps: 3 Entrance Stairs-Rails: Left;Right;Can reach  both Home Layout: Two level;Bed/bath upstairs Alternate Level Stairs-Number of Steps: 12 Alternate Level Stairs-Rails: Left;Right;Can reach both Bathroom Toilet: Standard Home Adaptive Equipment: Environmental consultant - four wheeled;Walker - rolling;Grab bars in shower;Grab bars around toilet;Straight cane;Bedside commode/3-in-1 Prior Function Level of Independence: Independent with assistive device(s) Driving: No Comments: Recent fall at home when getting out of bed (tripped on trash can). Reports she used to have frequent falls.  Communication Communication: No difficulties    Cognition  Overall Cognitive Status: Appears within functional limits for tasks assessed/performed Arousal/Alertness: Awake/alert Orientation Level: Appears intact for tasks assessed Behavior During Session: [facility] for tasks performed    Extremity/Trunk Assessment Right Upper Extremity Assessment RUE ROM/Strength/Tone:  (see OT note) Left Upper Extremity Assessment LUE ROM/Strength/Tone:  (see OT note) Right Lower Extremity Assessment RLE ROM/Strength/Tone: Deficits RLE ROM/Strength/Tone Deficits: grossly 4/5 Left Lower Extremity Assessment LLE ROM/Strength/Tone: Deficits LLE ROM/Strength/Tone Deficits: grossly 4/5   Balance    End of Session PT - End of Session Equipment Utilized During Treatment: Gait belt Activity Tolerance: Patient tolerated treatment well;Other (comment) (pt not agreeable to much because of being tired) Patient left: in bed;with call bell/phone within reach;with bed alarm set Nurse Communication: Mobility status  GP Functional Limitation: Mobility: Walking and moving around Mobility: Walking and Moving Around Current Status (Z6109): At least 20 percent but less than 40 percent impaired, limited or restricted Mobility: Walking and Moving Around Goal Status [phone]): 0 percent impaired, limited or restricted   [facility] HELEN 09/04/2012, 2:53 PM

## 2012-09-04 NOTE — ED Provider Notes (Signed)
I saw and evaluated the patient, reviewed the resident's note and I agree with the findings and plan.  Patient with a history concerning for TIA with to 30 minute episodes of aphasia today. Currently on exam patient is at baseline without deficits. Patient is unable to get an MRI due to pacemaker. Discuss patient's case with neurology and patient is currently taking Plavix and aspirin.   Date: 09/04/2012  Rate: 72  Rhythm: atrial paced  QRS Axis: indeterminate  Intervals: normal  ST/T Wave abnormalities: indeterminate  Conduction Disutrbances:none  Narrative Interpretation:   Old EKG Reviewed: unchanged    Gwyneth Sprout, MD 09/04/12 0010

## 2012-09-04 NOTE — Evaluation (Signed)
Clinical/Bedside Swallow Evaluation Patient Details  Name: [name] MRN: [id number] Date of Birth: [birth year]  Today's Date: 09/04/2012 Time: 0820-0845 SLP Time Calculation (min): 25 min  Past Medical History:  Past Medical History  Diagnosis Date  . Hypertension   . Hyperlipidemia   . GERD (gastroesophageal reflux disease)   . Dysphagia     appt with [name] upcoming  . Chronic recurrent sinusitis   . Recurrent UTI   . Atrial fibrillation     ???  . Arthritis   . TIA (transient ischemic attack)    Past Surgical History:  Past Surgical History  Procedure Date  . Pacemaker insertion   . Back surgery 1985  . Shoulder surgery 2008    Left  (Dr. Ranell Whitehead)  . Total knee arthroplasty 2000, 2001    bilateral (Dr. Despina Whitehead), left 2000, right 2001  . Cataract extraction 1986, 2003    left 1986, right 2003  . Vaginal hysterectomy 1969  . Bilateral salpingoophorectomy 1971  . Tonsillectomy 1951  . Appendectomy 1952  . Mastoidectomy 1970  . Sinus exploration 1972  . Knee arthroscopy 1988, 1995, 2000  . Hemorrhoid surgery 2009  . Repair / reconstruction interphalangeal joint 2004    R thumb  . Cystoscopy 1992  . Insert / replace / remove pacemaker    HPI:  Pt. is 78 y/o female with h/x of HTN, GERD, dysphagia, and TIA. CXR was negative. CT showed no acute findings. Per MD notes, multiple TIAs may culminate in CVA.     Assessment / Plan / Recommendation Clinical Impression  Pt. seen for Bedside Swallow Evaluation, she is alert, cooperative, and pleasant at bedside. No s/s of aspiration present at bedside. Pt. presented with belch 2-3x indicative of esophageal impairments which she states is her baseline. Pt. does have h/x of GERD and esophageal dilitation greater than 2 years ago.  Pt. reports she has an appointment with GI MD (for esophagram versus endoscopy ?).  SLP educated Pt. on reflux precautions, Pt. agreeable to strategies. SLP recommends regular diet with thin  liquids. No need to follow for swallow safety, however speech language eval is pending.      Aspiration Risk  Mild    Diet Recommendation Regular;Thin liquid   Liquid Administration via: Cup Medication Administration: Whole meds with liquid Supervision: Patient able to self feed;Intermittent supervision to cue for compensatory strategies Compensations: Slow rate;Small sips/bites;Follow solids with liquid (warm or room temp liquids) Postural Changes and/or Swallow Maneuvers: Seated upright 90 degrees;Upright 30-60 min after meal    Other  Recommendations Oral Care Recommendations: Oral care QID   Follow Up Recommendations  None    Frequency and Duration            SLP Swallow Goals     Swallow Study Prior Functional Status  Lives With: Son Available Help at Discharge: Family;Available 24 hours/day    General HPI: Pt. is 78 y/o female with h/x of HTN, GERD, dysphagia, and TIA. CXR was negative. CT showed no acute findings. Per MD notes, multiple TIAs may culminate in CVA.   Type of Study: Bedside swallow evaluation Diet Prior to this Study: NPO Temperature Spikes Noted: No Respiratory Status: Room air History of Recent Intubation: No Behavior/Cognition: Alert;Cooperative;Pleasant mood Oral Cavity - Dentition: Dentures, top;Dentures, bottom (implants on bottom) Self-Feeding Abilities: Able to feed self Patient Positioning: Upright in bed Baseline Vocal Quality: Clear Volitional Cough: Congested;Strong Volitional Swallow: Able to elicit    Oral/Motor/Sensory Function Overall Oral Motor/Sensory Function:  Appears within functional limits for tasks assessed   Ice Chips Ice chips: Within functional limits Presentation: Self Fed;Spoon   Thin Liquid Thin Liquid: Within functional limits Presentation: Cup;Self Fed    Nectar Thick Nectar Thick Liquid: Not tested   Honey Thick Honey Thick Liquid: Not tested   Puree Puree: Within functional limits   Solid   GO Functional  Assessment Tool Used: clinical judgement Functional Limitations: Swallowing Swallow Current Status (Z6109): At least 1 percent but less than 20 percent impaired, limited or restricted Swallow Goal Status [phone]): At least 1 percent but less than 20 percent impaired, limited or restricted Swallow Discharge Status [phone]): At least 1 percent but less than 20 percent impaired, limited or restricted  Solid: Within functional limits Presentation: Self Fed       [name] 09/04/2012,9:19 AM

## 2012-09-04 NOTE — Progress Notes (Signed)
VASCULAR LAB PRELIMINARY  PRELIMINARY  PRELIMINARY  PRELIMINARY  Carotid duplex completed.    Preliminary report:  Bilateral:  No evidence of hemodynamically significant internal carotid artery stenosis.   Vertebral artery flow is antegrade.     Kingston Shawgo, RVS 09/04/2012, 12:22 PM

## 2012-09-04 NOTE — Evaluation (Signed)
SLP has reviewed and agrees with student's note below.  Cornelious Bartolucci Willis Mariabelen Pressly M.Ed CCC-SLP Pager [phone]  09/04/2012  

## 2012-09-04 NOTE — Progress Notes (Signed)
  Echocardiogram 2D Echocardiogram has been performed.  Glenis Musolf 09/04/2012, 11:09 AM

## 2012-09-04 NOTE — Progress Notes (Signed)
[facility] PROGRESS NOTE  [name] ZOX:[id number] DOB: 07-Apr-1931 DOA: 09/03/2012 PCP: Bridget Oats, MD  Assessment/Plan: 1. TIA- patient back to baseline, was recently in hospital in July for same issue- is not a candidate for MRI due to pacemaker, on ASA/plavix, not a candidate for coumadin because of falls and patient refused- await PT/OT eval, tele ok, await urine culture, passed swallow eval, carotid U/S pending 2. A fib- ASA/plavix 3. HTN- stable 4. GERD- PPI 5. Skin tear on shin- neosporin and cover    Code Status: full Family Communication: patient at bedside Disposition Plan: home when work up done    HPI/Subjective: C/o IV hurting C/o skin tear on shin   Objective: Filed Vitals:   09/04/12 0100 09/04/12 0300 09/04/12 0601 09/04/12 0825  BP: 172/88 154/65  156/73  Pulse: 87 78 78 72  Temp: 98.3 F (36.8 C) 98 F (36.7 C) 98.2 F (36.8 C) 98.5 F (36.9 C)  TempSrc: Oral Oral Oral Oral  Resp:    18  Height:      Weight:      SpO2: 100% 95% 100% 96%   No intake or output data in the 24 hours ending 09/04/12 1002 Filed Weights   09/03/12 2330  Weight: 72.5 kg (159 lb 13.3 oz)    Exam:   General:  A+Ox3, NAD  Cardiovascular: rrr  Respiratory: clear anterior  Abdomen: +BS, soft, NT/ND  Skin: 2x2 area of redness/skin tear in shin/LE  Data Reviewed: Basic Metabolic Panel:  Lab 09/03/12 8119  NA 140  K 4.0  CL 104  CO2 28  GLUCOSE 99  BUN 15  CREATININE 1.11*  CALCIUM 9.4  MG --  PHOS --   Liver Function Tests: No results found for this basename: AST:5,ALT:5,ALKPHOS:5,BILITOT:5,PROT:5,ALBUMIN:5 in the last 168 hours No results found for this basename: LIPASE:5,AMYLASE:5 in the last 168 hours No results found for this basename: AMMONIA:5 in the last 168 hours CBC:  Lab 09/03/12 2042  WBC 6.0  NEUTROABS --  HGB 9.8*  HCT 30.1*  MCV 94.1  PLT 203   Cardiac Enzymes: No results found for this basename:  CKTOTAL:5,CKMB:5,CKMBINDEX:5,TROPONINI:5 in the last 168 hours BNP (last 3 results) No results found for this basename: PROBNP:3 in the last 8760 hours CBG: No results found for this basename: GLUCAP:5 in the last 168 hours  Recent Results (from the past 240 hour(s))  GRAM STAIN     Status: Normal   Collection Time   09/03/12  9:22 PM      Component Value Range Status Comment   Specimen Description URINE, RANDOM   Final    Special Requests NONE   Final    Gram Stain     Final    Value: CYTOSPUN     WBC PRESENT,BOTH PMN AND MONONUCLEAR     GRAM POSITIVE COCCI IN PAIRS IN CLUSTERS     GRAM NEGATIVE RODS     Results Called to: Bridget Snipes, RN [id number] WilderK   Report Status 09/03/2012 FINAL   Final      Studies: Dg Chest 2 View  09/04/2012  *RADIOLOGY REPORT*  Clinical Data: Stroke  CHEST - 2 VIEW  Comparison: 06/12/2012 and 05/20/2008  Findings: Stable asymmetric elevation of the right hemidiaphragm. Lungs are clear. Cardiopericardial silhouette is at upper limits of normal for size. Scarring in the left mid lung again noted.  Right- sided dual lead permanent pacemaker remains in place.  The patient is status post left hip replacement.  IMPRESSION:  Stable exam.  No acute cardiopulmonary process.   Original Report Authenticated By: ERIC A. MANSELL, M.D.    Ct Head Wo Contrast  09/03/2012  *RADIOLOGY REPORT*  Clinical Data: Expressive aphasia.  Headaches.  CT HEAD WITHOUT CONTRAST  Technique:  Contiguous axial images were obtained from the base of the skull through the vertex without contrast.  Comparison: None.  Findings: The brain stem, cerebellum, cerebral peduncles, thalami, basal ganglia, basilar cisterns, and ventricular system appear unremarkable.  Periventricular and corona radiata white matter hypodensities are most compatible with chronic ischemic microvascular white matter disease.  No intracranial hemorrhage, mass lesion, or acute infarction is identified.  Chronic right ethmoid  sinusitis noted.  IMPRESSION:  1.  Chronic right ethmoid sinusitis. 2. Periventricular and corona radiata white matter hypodensities are most compatible with chronic ischemic microvascular white matter disease.  3.  No acute findings.   Original Report Authenticated By: Dellia Cloud, M.D.     Scheduled Meds:   . allopurinol  100 mg Oral BID  . aspirin EC  81 mg Oral Daily  . calcitRIOL  0.25 mcg Oral Daily  . clopidogrel  75 mg Oral Q breakfast  . dextromethorphan-guaiFENesin  1 tablet Oral Q12H  . DULoxetine  60 mg Oral Daily  . enoxaparin (LOVENOX) injection  30 mg Subcutaneous Q24H  . fesoterodine  8 mg Oral Daily  . influenza  inactive virus vaccine  0.5 mL Intramuscular Tomorrow-1000  . ipratropium  2 spray Nasal q morning - 10a  . levothyroxine  25 mcg Oral QAC breakfast  . multivitamin with minerals  1 tablet Oral Daily  . omega-3 acid ethyl esters  2 g Oral Daily  . pantoprazole  40 mg Oral BID  . potassium chloride  10 mEq Oral BID  . simvastatin  40 mg Oral QPM  . sotalol  120 mg Oral BID   Continuous Infusions:   Principal Problem:  *TIA (transient ischemic attack)    Time spent: 35 min    [name]  [facility] Pager [phone] . If 8PM-8AM, please contact night-coverage at [email or web address], password [facility] 09/04/2012, 10:02 AM  LOS: 1 day

## 2012-09-04 NOTE — Evaluation (Signed)
Occupational Therapy Evaluation Patient Details Name: [name] MRN: [id number] DOB: [birth year] Today's Date: 09/04/2012 Time: 4098-1191 OT Time Calculation (min): 29 min  OT Assessment / Plan / Recommendation Clinical Impression  Pt admitted with aphasia. TIA  vs CVA work up being completed. Will benefit from acute OT to addrses below problem list in prep for d/c home.      OT Assessment  Patient needs continued OT Services    Follow Up Recommendations  Home health OT (vs none pending progress)    Barriers to Discharge      Equipment Recommendations  None recommended by OT    Recommendations for Other Services    Frequency  Min 2X/week    Precautions / Restrictions Precautions Precautions: Fall Restrictions Weight Bearing Restrictions: No   Pertinent Vitals/Pain See vitals    ADL  Eating/Feeding: Performed;Independent Where Assessed - Eating/Feeding: Edge of bed Lower Body Dressing: Performed;Independent (donned socks) Where Assessed - Lower Body Dressing: Unsupported sitting Toilet Transfer: Performed;Minimal assistance Toilet Transfer Method: Sit to stand Toilet Transfer Equipment: Bedside commode Toileting - Clothing Manipulation and Hygiene: Performed;Min guard Where Assessed - Engineer, mining and Hygiene: Sit to stand from 3-in-1 or toilet Equipment Used:  (HHA) Transfers/Ambulation Related to ADLs: Min HHA during toilet transfer for balance. ADL Comments: Pt is near baseline with ADLs but demonstrating balance deficits during toilet transfer. Pt with LOB x1 when standing to pull pants up over hips during toileting tasks and required min assist to steady.    OT Diagnosis: Generalized weakness  OT Problem List: Impaired balance (sitting and/or standing) OT Treatment Interventions: Self-care/ADL training;Therapeutic activities;Patient/family education;Balance training   OT Goals Acute Rehab OT Goals OT Goal Formulation: With patient Time  For Goal Achievement: 09/11/12 Potential to Achieve Goals: Good ADL Goals Pt Will Perform Grooming: with modified independence;Standing at sink ADL Goal: Grooming - Progress: Goal set today Pt Will Transfer to Toilet: with modified independence;Ambulation;with DME;Comfort height toilet ADL Goal: Toilet Transfer - Progress: Goal set today Pt Will Perform Toileting - Clothing Manipulation: with modified independence;Standing;Sitting on 3-in-1 or toilet ADL Goal: Toileting - Clothing Manipulation - Progress: Goal set today Pt Will Perform Toileting - Hygiene: with modified independence;Sit to stand from 3-in-1/toilet;Standing at 3-in-1/toilet ADL Goal: Toileting - Hygiene - Progress: Goal set today Miscellaneous OT Goals Miscellaneous OT Goal #1: Pt will perform dynamic standing balance task with mod I >5 min in prep for ADLs. OT Goal: Miscellaneous Goal #1 - Progress: Goal set today  Visit Information  Last OT Received On: 09/11/12 Assistance Needed: +1    Subjective Data      Prior Functioning     Home Living Lives With: Son Available Help at Discharge: Family;Available 24 hours/day Type of Home: House Home Access: Stairs to enter [employer] of Steps: 3 Entrance Stairs-Rails: Right;Left;Can reach both Home Layout: Two level;Bed/bath upstairs Alternate Level Stairs-Number of Steps: 12 Alternate Level Stairs-Rails: Right;Left;Can reach both Bathroom Shower/Tub: Health visitor: Standard Home Adaptive Equipment: Environmental consultant - four wheeled;Shower chair with back;Grab bars in shower;Grab bars around toilet;Bedside commode/3-in-1;Straight cane Additional Comments: Pt's son works from home and is available throughout day.  Prior Function Level of Independence: Independent with assistive device(s) Comments: Pt with recent fall at home when getting out of bed (tripped on trash can).  Reports that she used to have frequent falls but hasn't had any until this  recent fall a few days ago. Communication Communication: No difficulties  Vision/Perception     Cognition  Overall Cognitive Status: Appears within functional limits for tasks assessed/performed Arousal/Alertness: Awake/alert Orientation Level: Appears intact for tasks assessed Behavior During Session: [facility] for tasks performed    Extremity/Trunk Assessment Right Upper Extremity Assessment RUE ROM/Strength/Tone: [facility] for tasks assessed (arthritis) RUE Sensation: WFL - Light Touch;WFL - Proprioception RUE Coordination: WFL - gross/fine motor Left Upper Extremity Assessment LUE ROM/Strength/Tone: WFL for tasks assessed (arthritis) LUE Sensation: WFL - Light Touch;WFL - Proprioception LUE Coordination: WFL - gross/fine motor     Mobility Bed Mobility Bed Mobility: Supine to Sit;Sitting - Scoot to Edge of Bed;Sit to Supine Supine to Sit: 6: Modified independent (Device/Increase time);With rails Sitting - Scoot to Edge of Bed: 6: Modified independent (Device/Increase time) Sit to Supine: 6: Modified independent (Device/Increase time) Details for Bed Mobility Assistance: mod I for increased time Transfers Transfers: Sit to Stand;Stand to Sit Sit to Stand: 4: Min guard;4: Min assist;From chair/3-in-1;From bed;With upper extremity assist Stand to Sit: 4: Min guard;To bed;To chair/3-in-1;With upper extremity assist Details for Transfer Assistance: Min assist during sit<>stand from 3n1 for balance. Min guard during sit<>stand from bed.     Shoulder Instructions     Exercise     Balance Balance Balance Assessed: Yes Static Sitting Balance Static Sitting - Balance Support: Feet supported Static Sitting - Level of Assistance: 7: Independent Static Sitting - Comment/# of Minutes: 3 min Dynamic Sitting Balance Dynamic Sitting - Balance Support: No upper extremity supported;During functional activity Dynamic Sitting - Level of Assistance: 7: Independent Dynamic Sitting  Balance - Compensations: Pt donned socks sitting EOB.  ~2 min. Dynamic Standing Balance Dynamic Standing - Balance Support: No upper extremity supported;During functional activity Dynamic Standing - Level of Assistance: 4: Min assist Dynamic Standing - Balance Activities: Lateral lean/weight shifting;Forward lean/weight shifting Dynamic Standing - Comments: Min assist due to LOB while pulling pants up over hips after toileting.   End of Session OT - End of Session Equipment Utilized During Treatment:  (HHA) Activity Tolerance: Patient tolerated treatment well Patient left: in bed;with call bell/phone within reach Nurse Communication: Mobility status  GO    09/04/2012 Cipriano Mile OTR/L Pager [phone] Office [phone]  Cipriano Mile 09/04/2012, 9:32 AM

## 2012-09-05 ENCOUNTER — Observation Stay (HOSPITAL_COMMUNITY): Payer: Medicare Other

## 2012-09-05 DIAGNOSIS — N390 Urinary tract infection, site not specified: Secondary | ICD-10-CM

## 2012-09-05 DIAGNOSIS — G9341 Metabolic encephalopathy: Secondary | ICD-10-CM

## 2012-09-05 MED ORDER — ONDANSETRON HCL 4 MG/2ML IJ SOLN
4.0000 mg | Freq: Once | INTRAMUSCULAR | Status: AC
  Administered 2012-09-05: 4 mg via INTRAMUSCULAR

## 2012-09-05 MED ORDER — FUROSEMIDE 40 MG PO TABS
40.0000 mg | ORAL_TABLET | Freq: Every day | ORAL | Status: DC
  Administered 2012-09-05 – 2012-09-07 (×3): 40 mg via ORAL
  Filled 2012-09-05 (×4): qty 1

## 2012-09-05 MED ORDER — ONDANSETRON HCL 4 MG/2ML IJ SOLN
INTRAMUSCULAR | Status: AC
  Filled 2012-09-05: qty 2

## 2012-09-05 MED ORDER — HYDRALAZINE HCL 20 MG/ML IJ SOLN
10.0000 mg | Freq: Four times a day (QID) | INTRAMUSCULAR | Status: DC | PRN
  Filled 2012-09-05 (×2): qty 0.5

## 2012-09-05 MED ORDER — TEMAZEPAM 7.5 MG PO CAPS
7.5000 mg | ORAL_CAPSULE | Freq: Every evening | ORAL | Status: DC | PRN
  Administered 2012-09-06: 7.5 mg via ORAL
  Filled 2012-09-05 (×2): qty 1

## 2012-09-05 MED ORDER — LISINOPRIL 20 MG PO TABS
20.0000 mg | ORAL_TABLET | Freq: Every day | ORAL | Status: DC
  Administered 2012-09-05 – 2012-09-07 (×3): 20 mg via ORAL
  Filled 2012-09-05 (×4): qty 1

## 2012-09-05 MED ORDER — BUTALBITAL-APAP-CAFFEINE 50-325-40 MG PO TABS
1.0000 | ORAL_TABLET | Freq: Four times a day (QID) | ORAL | Status: DC | PRN
  Administered 2012-09-07: 1 via ORAL
  Filled 2012-09-05: qty 1

## 2012-09-05 MED ORDER — DEXTROSE 5 % IV SOLN
1.0000 g | INTRAVENOUS | Status: DC
  Administered 2012-09-05: 1 g via INTRAVENOUS
  Filled 2012-09-05 (×2): qty 10

## 2012-09-05 MED ORDER — ONDANSETRON HCL 4 MG PO TABS
4.0000 mg | ORAL_TABLET | Freq: Three times a day (TID) | ORAL | Status: DC | PRN
  Administered 2012-09-06 – 2012-09-07 (×2): 4 mg via ORAL
  Filled 2012-09-05 (×2): qty 1

## 2012-09-05 MED ORDER — ONDANSETRON HCL 4 MG/2ML IJ SOLN
4.0000 mg | Freq: Four times a day (QID) | INTRAMUSCULAR | Status: DC | PRN
Start: 2012-09-05 — End: 2012-09-05

## 2012-09-05 NOTE — Progress Notes (Signed)
Occupational Therapy Treatment Patient Details Name: [name] MRN: [id number] DOB: 11/30/1931 Today's Date: 09/05/2012 Time: 4098-1191 OT Time Calculation (min): 26 min  OT Assessment / Plan / Recommendation Comments on Treatment Session Session limited today due to pt demonstrating confusion/ cognitive deficits.  At this time, pt will need 24/7 assist at home.  Will continue to recommend d/c home with son. However, if son unable to provide 24/7 assist, may need to consider ST SNF. Will continue to assess.    Follow Up Recommendations  Home health OT;Supervision/Assistance - 24 hour    Barriers to Discharge       Equipment Recommendations  None recommended by OT    Recommendations for Other Services    Frequency Min 2X/week   Plan Discharge plan remains appropriate    Precautions / Restrictions Precautions Precautions: Fall   Pertinent Vitals/Pain See vitals    ADL  Toilet Transfer: Performed;Minimal assistance Toilet Transfer Method: Stand pivot;Sit to Barista: Bedside commode Toileting - Clothing Manipulation and Hygiene: Performed;Min guard Where Assessed - Engineer, mining and Hygiene: Sit to stand from 3-in-1 or toilet Equipment Used: Rolling walker Transfers/Ambulation Related to ADLs: min assist for balance and safety.  Pt attempting to push RW away while reaching for 3n1.  Max verbal cueing for safety with RW. ADL Comments: Pt very confused today during session and thus requiring increased time for tasks.  RN made aware and called into room.  RN Rosanne Sack) reports confusion may be likely due to medication administered earlier this AM, but MD is also aware.     OT Diagnosis:    OT Problem List:   OT Treatment Interventions:     OT Goals ADL Goals Pt Will Transfer to Toilet: with modified independence;Ambulation;with DME;Comfort height toilet ADL Goal: Toilet Transfer - Progress: Progressing toward goals Pt Will Perform  Toileting - Clothing Manipulation: with modified independence;Standing;Sitting on 3-in-1 or toilet ADL Goal: Toileting - Clothing Manipulation - Progress: Progressing toward goals Pt Will Perform Toileting - Hygiene: with modified independence;Sit to stand from 3-in-1/toilet;Standing at 3-in-1/toilet ADL Goal: Toileting - Hygiene - Progress: Progressing toward goals  Visit Information  Last OT Received On: 09/05/12 Assistance Needed: +1    Subjective Data      Prior Functioning       Cognition  Overall Cognitive Status: Impaired Area of Impairment: Safety/judgement;Problem solving;Awareness of errors Arousal/Alertness: Awake/alert Orientation Level: Disoriented to;Place Behavior During Session: Restless Safety/Judgement: Decreased safety judgement for tasks assessed;Impulsive;Decreased awareness of need for assistance Safety/Judgement - Other Comments: Pt attempting to push RW out and away and then reaching for 3n1 to hold on to instead for stability. Awareness of Errors: Assistance required to identify errors made;Assistance required to correct errors made Awareness of Errors - Other Comments: Unsafe use of RW Cognition - Other Comments: When asked where she was today, pt able to state "[place]", but several minutes later stated "Whose room is this?"  Pt asking throughout session why people are looking at her through the window (referring to the TV).  Pt easily distracted throughout session and seems to be having some hallucinations.  RN aware and at bedside.    Mobility  Shoulder Instructions Bed Mobility Bed Mobility: Supine to Sit;Sitting - Scoot to Edge of Bed Supine to Sit: 6: Modified independent (Device/Increase time) Sitting - Scoot to Edge of Bed: 5: Supervision Details for Bed Mobility Assistance: Increased time due to confusino. Supervision for safety when scooting EOB to ensure hips not too close  to edge. Transfers Transfers: Sit to Stand;Stand to Sit Sit to Stand:  4: Min assist;From bed Stand to Sit: 4: Min guard;To chair/3-in-1 Details for Transfer Assistance: min assist for bed with attempts x2 due to pt's confusion. Pt stood first time and then immediately sat down to because she "forgot what she was doing."       Exercises      Balance     End of Session OT - End of Session Activity Tolerance:  (session limited by pt needing to sit on 3n1) Patient left: with nursing in room (on 3n1) Nurse Communication: Mobility status (confusion/cognition)  GO Functional Assessment Tool Used: clinical judgement Functional Limitation: Self care Self Care Current Status (W0981): At least 1 percent but less than 20 percent impaired, limited or restricted Self Care Goal Status [phone]): 0 percent impaired, limited or restricted   09/05/2012 Cipriano Mile OTR/L Pager [phone] Office [phone]  Cipriano Mile 09/05/2012, 12:47 PM

## 2012-09-05 NOTE — Care Management Note (Signed)
    Page 1 of 1   09/09/2012     3:44:04 PM   CARE MANAGEMENT NOTE 09/09/2012  Patient:  [name], [name]   Account Number:  [id number]  Date Initiated:  09/05/2012  Documentation initiated by:  Onnie Boer  Subjective/Objective Assessment:   PT WAS ADMITTED WITH WEAKNESS     Action/Plan:   PROGRESSION OF CARE AND DISCHARGE PLANNING   Anticipated DC Date:  09/06/2012   Anticipated DC Plan:  HOME W HOME HEALTH SERVICES      DC Planning Services  CM consult      Choice offered to / List presented to:  C-1 Patient        HH arranged  HH-2 PT  HH-3 OT  HH-4 NURSE'S AIDE      HH agency  [facility]   Status of service:  Completed, signed off Medicare Important Message given?   (If response is "NO", the following Medicare IM given date fields will be blank) Date Medicare IM given:   Date Additional Medicare IM given:    Discharge Disposition:  HOME W HOME HEALTH SERVICES  Per UR Regulation:  Reviewed for med. necessity/level of care/duration of stay  If discussed at Long Length of Stay Meetings, dates discussed:    Comments:  09/05/12 Onnie Boer, RN, BSN [phone] PT WAS ADMITTED WITH TIA.  PTA PT WAS AT HOME WITH HER SON. PT WILL BE DC'D WITH HH FROM [facility].  WILL F/U.

## 2012-09-05 NOTE — Progress Notes (Signed)
09/05/12 Pt refuses IV restart. Pt states she does "not want an iv. They always bother me. I do not want one."  Pt stated she would "rather have a shot." MD notified. Zofran switched to po. Will continue to monitor.

## 2012-09-05 NOTE — Progress Notes (Addendum)
Occupational Therapy Eval Addended to add G Code  09/04/12 0900  OT Visit Information  Last OT Received On 09/11/12  Assistance Needed +1  OT Time Calculation  OT Start Time 0846  OT Stop Time 0915  OT Time Calculation (min) 29 min  Precautions  Precautions Fall  Home Living  Lives With Son  Available Help at Discharge Family;Available 24 hours/day  Type of Home House  Home Access Stairs to enter  Entrance Stairs-Number of Steps 3  Entrance Stairs-Rails Right;Left;Can reach both  Home Layout Two level;Bed/bath upstairs  Alternate Level Stairs-Number of Steps 12  Alternate Level Stairs-Rails Right;Left;Can reach both  Production designer, theatre/television/film - four wheeled;Shower chair with back;Grab bars in shower;Grab bars around toilet;Bedside commode/3-in-1;Straight cane  Additional Comments Pt's son works from home and is available throughout day.   Prior Function  Level of Independence Independent with assistive device(s)  Comments Pt with recent fall at home when getting out of bed (tripped on trash can).  Reports that she used to have frequent falls but hasn't had any until this recent fall a few days ago.  Communication  Communication No difficulties  ADL  Eating/Feeding Performed;Independent  Where Assessed - Eating/Feeding Edge of bed  Lower Body Dressing Performed;Independent (donned socks)  Where Assessed - Lower Body Dressing Unsupported sitting  Toilet Transfer Performed;Minimal assistance  Toilet Transfer Method Sit to stand  Toilet Transfer Equipment Bedside commode  Toileting - Clothing Manipulation and Hygiene Performed;Min guard  Where Assessed - Toileting Clothing Manipulation and Hygiene Sit to stand from 3-in-1 or toilet  Equipment Used (HHA)  Transfers/Ambulation Related to ADLs Min HHA during toilet transfer for balance.  ADL Comments Pt is near baseline with ADLs but demonstrating balance deficits  during toilet transfer. Pt with LOB x1 when standing to pull pants up over hips during toileting tasks and required min assist to steady.  Cognition  Overall Cognitive Status Appears within functional limits for tasks assessed/performed  Arousal/Alertness Awake/alert  Orientation Level Appears intact for tasks assessed  Behavior During Session [facility] for tasks performed  Right Upper Extremity Assessment  RUE ROM/Strength/Tone [facility] for tasks assessed (arthritis)  RUE Sensation WFL - Light Touch;WFL - Proprioception  RUE Coordination WFL - gross/fine motor  Left Upper Extremity Assessment  LUE ROM/Strength/Tone WFL for tasks assessed (arthritis)  LUE Sensation WFL - Light Touch;WFL - Proprioception  LUE Coordination WFL - gross/fine motor  Bed Mobility  Bed Mobility Supine to Sit;Sitting - Scoot to Edge of Bed;Sit to Supine  Supine to Sit 6: Modified independent (Device/Increase time);With rails  Sitting - Scoot to Edge of Bed 6: Modified independent (Device/Increase time)  Sit to Supine 6: Modified independent (Device/Increase time)  Details for Bed Mobility Assistance mod I for increased time  Transfers  Transfers Sit to Stand;Stand to Sit  Sit to Stand 4: Min guard;4: Min assist;From chair/3-in-1;From bed;With upper extremity assist  Stand to Sit 4: Min guard;To bed;To chair/3-in-1;With upper extremity assist  Details for Transfer Assistance Min assist during sit<>stand from 3n1 for balance. Min guard during sit<>stand from bed.  Balance  Balance Assessed Yes  Static Sitting Balance  Static Sitting - Balance Support Feet supported  Static Sitting - Level of Assistance 7: Independent  Static Sitting - Comment/# of Minutes 3 min  Dynamic Sitting Balance  Dynamic Sitting - Balance Support No upper extremity supported;During functional activity  Dynamic Sitting - Level of Assistance 7: Independent  Dynamic Sitting  Balance - Compensations Pt donned socks sitting EOB.  ~2 min.  Dynamic  Standing Balance  Dynamic Standing - Balance Support No upper extremity supported;During functional activity  Dynamic Standing - Level of Assistance 4: Min assist  Dynamic Standing - Balance Activities Lateral lean/weight shifting;Forward lean/weight shifting  Dynamic Standing - Comments Min assist due to LOB while pulling pants up over hips after toileting.  OT - End of Session  Equipment Utilized During Treatment [facility])  Activity Tolerance Patient tolerated treatment well  Patient left in bed;with call bell/phone within reach  Nurse Communication Mobility status  OT Assessment  Clinical Impression Statement Pt admitted with aphasia. TIA  vs CVA work up being completed. Will benefit from acute OT to addrses below problem list in prep for d/c home.    OT Recommendation/Assessment Patient needs continued OT Services  OT Problem List Impaired balance (sitting and/or standing)  OT Therapy Diagnosis  Generalized weakness  OT Plan  OT Frequency Min 2X/week  OT Treatment/Interventions Self-care/ADL training;Therapeutic activities;Patient/family education;Balance training  OT Recommendation  Follow Up Recommendations Home health OT (vs none pending progress)  Equipment Recommended None recommended by OT  Individuals Consulted  Consulted and Agree with Results and Recommendations Patient  Acute Rehab OT Goals  OT Goal Formulation With patient  Time For Goal Achievement 09/11/12  Potential to Achieve Goals Good  ADL Goals  Pt Will Perform Grooming with modified independence;Standing at sink  Pt Will Transfer to Toilet with modified independence;Ambulation;with DME;Comfort height toilet  Pt Will Perform Toileting - Clothing Manipulation with modified independence;Standing;Sitting on 3-in-1 or toilet  Pt Will Perform Toileting - Hygiene with modified independence;Sit to stand from 3-in-1/toilet;Standing at 3-in-1/toilet  ADL Goal: Grooming - Progress Goal set today  ADL Goal: Toilet Transfer -  Progress Goal set today  ADL Goal: Toileting - Clothing Manipulation - Progress Goal set today  ADL Goal: Toileting - Hygiene - Progress Goal set today  Miscellaneous OT Goals  Miscellaneous OT Goal #1 Pt will perform dynamic standing balance task with mod I >5 min in prep for ADLs.  OT Goal: Miscellaneous Goal #1 - Progress Goal set today  OT G-codes **NOT FOR INPATIENT CLASS**  Functional Assessment Tool Used clinical judgement  Functional Limitation Self care  Self Care Current Status (R6045) CI  Self Care Goal Status (W0981) CH  OT General Charges  $OT Visit 1 Procedure  OT Evaluation  $Initial OT Evaluation Tier I 1 Procedure  OT Treatments  $Self Care/Home Management  8-22 mins   09/05/2012 Cipriano Mile OTR/L Pager [phone] Office [phone]

## 2012-09-05 NOTE — Evaluation (Signed)
Speech Language Pathology Evaluation Patient Details Name: [name] MRN: [id number] DOB: 19-Aug-1931 Today's Date: 09/05/2012 Time: 4098-1191 SLP Time Calculation (min): 10 min  Problem List:  Patient Active Problem List  Diagnosis  . TIA (transient ischemic attack)  . Hypertension  . Hyperlipidemia  . Microscopic hematuria  . Anemia   Past Medical History:  Past Medical History  Diagnosis Date  . Hypertension   . Hyperlipidemia   . GERD (gastroesophageal reflux disease)   . Dysphagia     appt with Molly Maduro Buccini upcoming  . Chronic recurrent sinusitis   . Recurrent UTI   . Atrial fibrillation     ???  . Arthritis   . TIA (transient ischemic attack)    Past Surgical History:  Past Surgical History  Procedure Date  . Pacemaker insertion   . Back surgery 1985  . Shoulder surgery 2008    Left  (Dr. Ranell Patrick)  . Total knee arthroplasty 2000, 2001    bilateral (Dr. Despina Hick), left 2000, right 2001  . Cataract extraction 1986, 2003    left 1986, right 2003  . Vaginal hysterectomy 1969  . Bilateral salpingoophorectomy 1971  . Tonsillectomy 1951  . Appendectomy 1952  . Mastoidectomy 1970  . Sinus exploration 1972  . Knee arthroscopy 1988, 1995, 2000  . Hemorrhoid surgery 2009  . Repair / reconstruction interphalangeal joint 2004    R thumb  . Cystoscopy 1992  . Insert / replace / remove pacemaker    HPI:  Pt. is 78 y/o female with h/x of HTN, GERD, dysphagia, and TIA. CXR was negative. CT showed no acute findings. Per MD notes, multiple TIAs may culminate in CVA.     Assessment / Plan / Recommendation Clinical Impression  Speech-language-cognitive assessment completed.  Pt. exhibits confusion today (not present yesterday), hallucinations, mild difficulty expressing her thoughts which is suspected to be related to cognitive impairments versus true language deficit.  Mild-mod cogntitive deficits present with verbal problem solving (max cues to state "911"), working  memory, attention and awareness.  Recommend ST to facilitate cognitive impairments while on acute care.  May require home health ST if confusion persists.     SLP Assessment  Patient needs continued Speech Lanaguage Pathology Services    Follow Up Recommendations  Home health SLP    Frequency and Duration min 2x/week  2 weeks       SLP Goals  SLP Goals Potential to Achieve Goals: Good SLP Goal #1: Pt. will demonstrate increased emergent awareness of cognitive deficits with mild question cues. SLP Goal #2: Pt. will demonstrate improved problem solving for commom activities with min verbal cues. SLP Goal #3: Pt. will state compensatory memory techniques with min verbal cues.  SLP Evaluation Prior Functioning  Cognitive/Linguistic Baseline: Information not available Type of Home: House Lives With: Son Available Help at Discharge: Family;Available 24 hours/day   Cognition  Overall Cognitive Status: Impaired Arousal/Alertness: Awake/alert Orientation Level: Oriented to time;Oriented to person;Oriented to place;Disoriented to situation Attention: Selective Selective Attention: Impaired Selective Attention Impairment: Verbal basic;Functional basic Memory: Impaired Memory Impairment: Decreased recall of new information;Decreased short term memory Decreased Short Term Memory: Verbal basic;Functional basic Awareness: Impaired Awareness Impairment: Anticipatory impairment;Emergent impairment;Intellectual impairment Problem Solving: Impaired Problem Solving Impairment: Functional basic;Verbal basic Safety/Judgment: Impaired    Comprehension  Auditory Comprehension Overall Auditory Comprehension: Appears within functional limits for tasks assessed Visual Recognition/Discrimination Discrimination: Not tested Reading Comprehension Reading Status: Not tested    Expression Expression Primary Mode of Expression: Verbal Verbal Expression  Overall Verbal Expression:  Impaired Initiation: No impairment Level of Generative/Spontaneous Verbalization: Conversation Repetition: No impairment Naming: Not tested Pragmatics: No impairment Interfering Components: Attention Written Expression Written Expression: Not tested   Oral / Motor Oral Motor/Sensory Function Overall Oral Motor/Sensory Function: Appears within functional limits for tasks assessed Motor Speech Overall Motor Speech: Appears within functional limits for tasks assessed Intelligibility: Intelligible Motor Planning: Witnin functional limits   GO Functional Assessment Tool Used: clinical judgement Functional Limitations: Memory Attention Current Status (H4742): At least 40 percent but less than 60 percent impaired, limited or restricted Attention Goal Status (V9563): At least 20 percent but less than 40 percent impaired, limited or restricted Memory Current Status (O7564): At least 40 percent but less than 60 percent impaired, limited or restricted Memory Goal Status (P3295): At least 20 percent but less than 40 percent impaired, limited or restricted   Royce Macadamia M.Ed [employer] [phone]  09/05/2012

## 2012-09-05 NOTE — Progress Notes (Signed)
Physical Therapy Treatment Patient Details Name: [name] MRN: [id number] DOB: 01/06/1931 Today's Date: 09/05/2012 Time: 1320-1340 PT Time Calculation (min): 20 min  PT Assessment / Plan / Recommendation Comments on Treatment Session  Ms. Siordia appears slightly confused this afternoon. Relatively resistant to any cueing from therapy for improved safety. Limited by nausea. Will need 24 hour assist with at least minA for all mobility given her confusion and decreased safety awareness.     Follow Up Recommendations  Home health PT;Supervision/Assistance - 24 hour    Barriers to Discharge        Equipment Recommendations  None recommended by PT    Recommendations for Other Services    Frequency Min 4X/week   Plan Discharge plan remains appropriate;Frequency remains appropriate    Precautions / Restrictions Precautions Precautions: Fall       Mobility  Bed Mobility Bed Mobility: Not assessed Supine to Sit: 6: Modified independent (Device/Increase time) Sitting - Scoot to Edge of Bed: 5: Supervision Details for Bed Mobility Assistance: Increased time due to confusino. Supervision for safety when scooting EOB to ensure hips not too close to edge. Transfers Transfers: Stand Pivot Transfers Sit to Stand: 3: Mod assist;With upper extremity assist;From toilet (grab bars around toilet) Stand to Sit: 4: Min assist;With upper extremity assist;To chair/3-in-1;To bed;With armrests Stand Pivot Transfers: 4: Min assist;With armrests Details for Transfer Assistance: cues for safe technique using RW, stability assist for transfers and cues for safe hand placement with standing as nursing students performed pericare following toileting Ambulation/Gait Ambulation/Gait Assistance: 4: Min assist Ambulation Distance (Feet): 30 Feet Assistive device: Rolling walker Ambulation/Gait Assistance Details: v/c's and tactile assist for stability and safe technique with RW Gait Pattern:  Step-through pattern;Lateral trunk lean to left;Trunk flexed;Lateral hip instability General Gait Details: RW too far ahead and pt unsafely reaching for soap at the sink when she was at least a foot away bending very far over to reach     PT Goals Acute Rehab PT Goals PT Goal: Sit to Stand - Progress: Progressing toward goal PT Goal: Stand to Sit - Progress: Progressing toward goal PT Transfer Goal: Bed to Chair/Chair to Bed - Progress: Progressing toward goal PT Goal: Ambulate - Progress: Progressing toward goal  Visit Information  Last PT Received On: 09/05/12 Assistance Needed: +1    Subjective Data  Subjective: I feel like I am going to be sick.   Cognition  Overall Cognitive Status: Impaired Area of Impairment: Safety/judgement;Memory;Attention Arousal/Alertness: Awake/alert Orientation Level:  (NT) Behavior During Session: Agitated Current Attention Level: Selective Safety/Judgement: Decreased safety judgement for tasks assessed Safety/Judgement - Other Comments: Pt attempting to push RW out and away and then reaching for 3n1 to hold on to instead for stability. Awareness of Errors: Assistance required to identify errors made;Assistance required to correct errors made Awareness of Errors - Other Comments: Unsafe use of RW Cognition - Other Comments: When asked where she was today, pt able to state "[place]", but several minutes later stated "Whose room is this?"  Pt asking throughout session why people are looking at her through the window (referring to the TV).  Pt easily distracted throughout session and seems to be having some hallucinations.  RN aware and at bedside.    Balance  Static Sitting Balance Static Sitting - Level of Assistance: 5: Stand by assistance Static Sitting - Comment/# of Minutes: pt sat for at least 4 minutes as she began vomitting, no LOB noted Static Standing Balance Static Standing - Balance  Support: Bilateral upper extremity supported Static  Standing - Level of Assistance: 4: Min assist Static Standing - Comment/# of Minutes: cues for safe hand placement and use of RW while nursing students performed hygiene, pt unsafely had one hand on RW and other on the grab bar with posterior lean needing minA to bring weight forward Dynamic Standing Balance Dynamic Standing - Comments: pt standing at the sink unsafely too far aware for good stabiliy with significant bent posture and resting forearms on the sink, cues for safe technique however pt very resistant to cueing  End of Session PT - End of Session Equipment Utilized During Treatment: Gait belt Activity Tolerance: Treatment limited secondary to medical complications (Comment) (pt nauseous) Patient left: in chair;with call bell/phone within reach;with nursing in room Nurse Communication: Mobility status   GP     [facility] HELEN 09/05/2012, 1:57 PM

## 2012-09-05 NOTE — Progress Notes (Addendum)
[facility] PROGRESS NOTE  [name] WUJ:[id number] DOB: Sep 09, 1931 DOA: 09/03/2012 PCP: Sheila Oats, MD  Assessment/Plan: 1. TIA-  was recently in hospital in July for same issue- is not a candidate for MRI due to pacemaker, on ASA/plavix, not a candidate for coumadin because of falls and patient refused-  PT/OT eval- home health, tele ok, await urine culture, passed swallow eval, carotid U/S ok 2. A fib- ASA/plavix- appears to be sinus on tele 3. HTN- increase BP medications 4. GERD- PPI 5. Skin tear on shin- neosporin and cover 6. ?UTI- await culture- rocephin day#1 7. AMS- ? From UTI, vs dementia, vs TIAs- repeat head CT- consider neurology consult if not better for recommnedations    Code Status: full Family Communication: patient at bedside/son on phone Disposition Plan: home when work up done    HPI/Subjective: More confused today, c/o headache   Objective: Filed Vitals:   09/05/12 0836 09/05/12 1047 09/05/12 1309 09/05/12 1340  BP: 180/76 181/86 168/67 171/81  Pulse: 87 81  89  Temp: 98.5 F (36.9 C) 98 F (36.7 C)  98.2 F (36.8 C)  TempSrc: Oral Oral  Oral  Resp: 18 18  18   Height:      Weight:      SpO2: 97%   99%    Intake/Output Summary (Last 24 hours) at 09/05/12 1446 Last data filed at 09/04/12 2300  Gross per 24 hour  Intake    520 ml  Output      0 ml  Net    520 ml   Filed Weights   09/03/12 2330  Weight: 72.5 kg (159 lb 13.3 oz)    Exam:   General:  Confused, anxious appearing  Cardiovascular: rrr  Respiratory: clear anterior  Abdomen: +BS, soft, NT/ND  Skin: 2x2 area of redness/skin tear in shin/LE  Data Reviewed: Basic Metabolic Panel:  Lab 09/03/12 9562  NA 140  K 4.0  CL 104  CO2 28  GLUCOSE 99  BUN 15  CREATININE 1.11*  CALCIUM 9.4  MG --  PHOS --   Liver Function Tests: No results found for this basename: AST:5,ALT:5,ALKPHOS:5,BILITOT:5,PROT:5,ALBUMIN:5 in the last 168 hours No results found  for this basename: LIPASE:5,AMYLASE:5 in the last 168 hours No results found for this basename: AMMONIA:5 in the last 168 hours CBC:  Lab 09/03/12 2042  WBC 6.0  NEUTROABS --  HGB 9.8*  HCT 30.1*  MCV 94.1  PLT 203   Cardiac Enzymes: No results found for this basename: CKTOTAL:5,CKMB:5,CKMBINDEX:5,TROPONINI:5 in the last 168 hours BNP (last 3 results) No results found for this basename: PROBNP:3 in the last 8760 hours CBG: No results found for this basename: GLUCAP:5 in the last 168 hours  Recent Results (from the past 240 hour(s))  GRAM STAIN     Status: Normal   Collection Time   09/03/12  9:22 PM      Component Value Range Status Comment   Specimen Description URINE, RANDOM   Final    Special Requests NONE   Final    Gram Stain     Final    Value: CYTOSPUN     WBC PRESENT,BOTH PMN AND MONONUCLEAR     GRAM POSITIVE COCCI IN PAIRS IN CLUSTERS     GRAM NEGATIVE RODS     Results Called to: Wendi Snipes, RN [id number]   Report Status 09/03/2012 FINAL   Final      Studies: Dg Chest 2 View  09/04/2012  *RADIOLOGY REPORT*  Clinical  Data: Stroke  CHEST - 2 VIEW  Comparison: 06/12/2012 and 05/20/2008  Findings: Stable asymmetric elevation of the right hemidiaphragm. Lungs are clear. Cardiopericardial silhouette is at upper limits of normal for size. Scarring in the left mid lung again noted.  Right- sided dual lead permanent pacemaker remains in place.  The patient is status post left hip replacement.  IMPRESSION: Stable exam.  No acute cardiopulmonary process.   Original Report Authenticated By: ERIC A. MANSELL, M.D.    Ct Head Wo Contrast  09/03/2012  *RADIOLOGY REPORT*  Clinical Data: Expressive aphasia.  Headaches.  CT HEAD WITHOUT CONTRAST  Technique:  Contiguous axial images were obtained from the base of the skull through the vertex without contrast.  Comparison: None.  Findings: The brain stem, cerebellum, cerebral peduncles, thalami, basal ganglia, basilar cisterns, and  ventricular system appear unremarkable.  Periventricular and corona radiata white matter hypodensities are most compatible with chronic ischemic microvascular white matter disease.  No intracranial hemorrhage, mass lesion, or acute infarction is identified.  Chronic right ethmoid sinusitis noted.  IMPRESSION:  1.  Chronic right ethmoid sinusitis. 2. Periventricular and corona radiata white matter hypodensities are most compatible with chronic ischemic microvascular white matter disease.  3.  No acute findings.   Original Report Authenticated By: Dellia Cloud, M.D.     Scheduled Meds:    . allopurinol  100 mg Oral BID  . aspirin EC  81 mg Oral Daily  . calcitRIOL  0.25 mcg Oral Daily  . clopidogrel  75 mg Oral Q breakfast  . dextromethorphan-guaiFENesin  1 tablet Oral Q12H  . DULoxetine  60 mg Oral Daily  . enoxaparin (LOVENOX) injection  30 mg Subcutaneous Q24H  . fesoterodine  8 mg Oral Daily  . furosemide  40 mg Oral Daily  . ipratropium  2 spray Nasal q morning - 10a  . levothyroxine  25 mcg Oral QAC breakfast  . lisinopril  20 mg Oral Daily  . multivitamin with minerals  1 tablet Oral Daily  . neomycin-bacitracin-polymyxin   Topical BID  . omega-3 acid ethyl esters  2 g Oral Daily  . ondansetron      . ondansetron  4 mg Intramuscular Once  . pantoprazole  40 mg Oral BID  . potassium chloride  10 mEq Oral BID  . simvastatin  40 mg Oral QPM  . sotalol  120 mg Oral BID   Continuous Infusions:   Principal Problem:  *TIA (transient ischemic attack)    Time spent: 35 min    Ajaya Crutchfield  [facility] Pager [phone] . If 8PM-8AM, please contact night-coverage at [email or web address], password [facility] 09/05/2012, 2:46 PM  LOS: 2 days

## 2012-09-06 ENCOUNTER — Other Ambulatory Visit: Payer: Medicare Other

## 2012-09-06 DIAGNOSIS — R4182 Altered mental status, unspecified: Secondary | ICD-10-CM

## 2012-09-06 LAB — VITAMIN B12: Vitamin B-12: 1348 pg/mL — ABNORMAL HIGH (ref 211–911)

## 2012-09-06 LAB — CBC
HCT: 33.8 % — ABNORMAL LOW (ref 36.0–46.0)
Platelets: 217 10*3/uL (ref 150–400)
RDW: 17.2 % — ABNORMAL HIGH (ref 11.5–15.5)
WBC: 7 10*3/uL (ref 4.0–10.5)

## 2012-09-06 LAB — BASIC METABOLIC PANEL
BUN: 12 mg/dL (ref 6–23)
Chloride: 99 mEq/L (ref 96–112)
GFR calc Af Amer: 51 mL/min — ABNORMAL LOW (ref >90)
Potassium: 3.2 mEq/L — ABNORMAL LOW (ref 3.5–5.1)

## 2012-09-06 MED ORDER — CEPHALEXIN 500 MG PO CAPS
500.0000 mg | ORAL_CAPSULE | Freq: Two times a day (BID) | ORAL | Status: DC
  Administered 2012-09-06 – 2012-09-07 (×3): 500 mg via ORAL
  Filled 2012-09-06 (×4): qty 1

## 2012-09-06 MED ORDER — ENOXAPARIN SODIUM 40 MG/0.4ML ~~LOC~~ SOLN
40.0000 mg | SUBCUTANEOUS | Status: DC
Start: 2012-09-07 — End: 2012-09-07
  Administered 2012-09-07: 40 mg via SUBCUTANEOUS
  Filled 2012-09-06: qty 0.4

## 2012-09-06 MED ORDER — POTASSIUM CHLORIDE CRYS ER 20 MEQ PO TBCR
40.0000 meq | EXTENDED_RELEASE_TABLET | Freq: Once | ORAL | Status: AC
  Administered 2012-09-06: 40 meq via ORAL
  Filled 2012-09-06: qty 2

## 2012-09-06 NOTE — Consult Note (Signed)
Reason for Consult: Confusion Referring Physician: Marlin Canary  CC: Confusion  History is obtained from: Patient, medical record  HPI: Ednamae R Barbian is an 78 y.o. female with a history of 2 TIAs in the past both consisting of brief periods of aphasia. Since admission, she had had very little sleep standing up most of the night, and then last night she had some episodes of confusion. She states that she told hair was growing out of the events, and does have memory of this. She currently states that she feels back to her normal self.   She does not have a history of dementia, but does have a history of atrial fibrillation. She is on aspirin and Plavix for this, and has been on Coumadin in the past but is felt to be a fall risk. I discussed this with her and she states that under no circumstances will she consider going back on Coumadin.  She also had a urinalysis that was suggestive of a possible urinary tract infection. She is currently being treated for this.   ROS: An 11 point ROS was performed and is negative except as noted in the HPI.  Past Medical History  Diagnosis Date  . Hypertension   . Hyperlipidemia   . GERD (gastroesophageal reflux disease)   . Dysphagia     appt with Molly Maduro Buccini upcoming  . Chronic recurrent sinusitis   . Recurrent UTI   . Atrial fibrillation     ???  . Arthritis   . TIA (transient ischemic attack)     Family History: No family history of stroke  Social History: Tob: Quit smoking 30 years ago  Exam: Current vital signs: BP 107/72  Pulse 79  Temp 98.9 F (37.2 C) (Oral)  Resp 18  Ht 5\' 5"  (1.651 m)  Wt 72.5 kg (159 lb 13.3 oz)  BMI 26.60 kg/m2  SpO2 96% Vital signs in last 24 hours: Temp:  [97.8 F (36.6 C)-99.1 F (37.3 C)] 98.9 F (37.2 C) (10/04 1440) Pulse Rate:  [77-103] 79  (10/04 1440) Resp:  [17-18] 18  (10/04 1440) BP: (107-196)/(66-92) 107/72 mmHg (10/04 1440) SpO2:  [94 %-99 %] 96 % (10/04 1440)  General: In  bed, no apparent distress CV: Regular rate and rhythm Mental Status: Patient is awake, alert, oriented to person, place, month, year, and situation. Immediate and remote memory are intact. Patient is able to give a clear and coherent history. Able to spell world backwards Cranial Nerves: II: Visual Fields are full. Pupils are equal, round, and reactive to light.  Discs are difficult visual. III,IV, VI: EOMI without ptosis or diploplia.  V,VII: Facial sensation and movement are symmetric.  VIII: hearing is intact to voice X: Uvula elevates symmetrically XI: Shoulder shrug is symmetric. XII: tongue is midline without atrophy or fasciculations.  Motor: Tone is normal. Bulk is normal. 5/5 strength was present in her right side, her left leg has 4+/5 strength and her left arm has normal strength to confrontation distally, however is limited due to previous shoulder problems. Sensory: Sensation is symmetric to light touch and temperature in the arms and legs., She has decreased vibratory sensation in the toes Deep Tendon Reflexes: 2+ and symmetric in the biceps and patellae.  Plantars: Toes are downgoing bilaterally.  Cerebellar: FNF intact on right, unable to perform on left due to shoulder pain Gait: Patient is able to stand, however when she attempts to walk she falls towards the left.  I have reviewed labs in epic  and the results pertinent to this consultation are: UA with possible infection   I have reviewed the images obtained: CT head-some atrophy, no acute findings  Impression: 78 year old female presenting with TIA. Her episode of confusion sounds consistent with delirium, and I suspect it is multifactorial including mild infection, lack of sleep in an elderly patient who though she does not have dementia does have atrophy on CT.  I also suspect that her gait difficulty is multifactorial including a left hemiparesis and some mild sensation loss. It is possible that she has had  a previous stroke in the past causing left hemiparesis, however given this is an old finding I do not think her change her current management.  Recommendations: 1) vitamin B12 for vibratory sensation 2) secondary stroke prevention would include changing to Coumadin if appropriate, however given that the patient will not consider it, then I would continue aspirin and Plavix. 3) A1c, 5.6, okay 4) LDL, 92, okay 5) carotid Dopplers, no stenosis 6) would continue to frequently reorientation, minimize stimulation and night and largely, lites on during the day. 7) will sign off at this time, please call with any further questions   Ritta Slot, MD [facility] [phone]

## 2012-09-06 NOTE — Progress Notes (Addendum)
[facility] PROGRESS NOTE  [name] ZOX:[id number] DOB: [birth year] DOA: 09/03/2012 PCP: Sheila Oats, MD  Assessment/Plan: 1. TIA-  was recently in hospital in July for same issue- is not a candidate for MRI due to pacemaker, on ASA/plavix, not a candidate for coumadin because of falls and patient refused-  PT/OT eval- home health, tele ok, await urine culture, passed swallow eval, carotid U/S ok 2. A fib- ASA/plavix- appears to be sinus on tele 3. HTN- increase BP medications 4. GERD- PPI 5. Skin tear on shin- neosporin and cover 6. ?UTI- await culture- refused IV and pulled out- change to PO abx- keflex for now, await culture 7. AMS- ? From UTI, vs dementia, vs TIAs vs lack of sleep- family denies any symptoms of dementia- repeated head CT ok- neurology consult for recommendations 8. Hypokalemia- releat    Code Status: full Family Communication: patient at bedside/LM for son Disposition Plan: home when work up done    HPI/Subjective: Still confused, knows where she is but is speaking inapporiately   Objective: Filed Vitals:   09/06/12 0126 09/06/12 0549 09/06/12 1017 09/06/12 1018  BP: 196/92 162/74 109/92 126/66  Pulse: 77 78 103   Temp: 97.8 F (36.6 C) 98.2 F (36.8 C) 98.3 F (36.8 C)   TempSrc: Oral Oral Oral   Resp: 18 18 18    Height:      Weight:      SpO2: 94% 99% 99%     Intake/Output Summary (Last 24 hours) at 09/06/12 1135 Last data filed at 09/05/12 1700  Gross per 24 hour  Intake    240 ml  Output      0 ml  Net    240 ml   Filed Weights   09/03/12 2330  Weight: 72.5 kg (159 lb 13.3 oz)    Exam:   General:  Confused, chronically ill appearing  Cardiovascular: rrr  Respiratory: clear anterior  Abdomen: +BS, soft, NT/ND  Skin: 2x2 area of redness/skin tear in shin/LE  Data Reviewed: Basic Metabolic Panel:  Lab 09/06/12 8119 09/03/12 2042  NA 138 140  K 3.2* 4.0  CL 99 104  CO2 27 28  GLUCOSE 114* 99  BUN 12 15    CREATININE 1.15* 1.11*  CALCIUM 9.9 9.4  MG -- --  PHOS -- --   Liver Function Tests: No results found for this basename: AST:5,ALT:5,ALKPHOS:5,BILITOT:5,PROT:5,ALBUMIN:5 in the last 168 hours No results found for this basename: LIPASE:5,AMYLASE:5 in the last 168 hours No results found for this basename: AMMONIA:5 in the last 168 hours CBC:  Lab 09/06/12 0535 09/03/12 2042  WBC 7.0 6.0  NEUTROABS -- --  HGB 10.8* 9.8*  HCT 33.8* 30.1*  MCV 92.6 94.1  PLT 217 203   Cardiac Enzymes: No results found for this basename: CKTOTAL:5,CKMB:5,CKMBINDEX:5,TROPONINI:5 in the last 168 hours BNP (last 3 results) No results found for this basename: PROBNP:3 in the last 8760 hours CBG: No results found for this basename: GLUCAP:5 in the last 168 hours  Recent Results (from the past 240 hour(s))  GRAM STAIN     Status: Normal   Collection Time   09/03/12  9:22 PM      Component Value Range Status Comment   Specimen Description URINE, RANDOM   Final    Special Requests NONE   Final    Gram Stain     Final    Value: CYTOSPUN     WBC PRESENT,BOTH PMN AND MONONUCLEAR     GRAM POSITIVE COCCI IN  PAIRS IN CLUSTERS     GRAM NEGATIVE RODS     Results Called to: Wendi Snipes, RN [phone]   Report Status 09/03/2012 FINAL   Final      Studies: Ct Head Wo Contrast  09/05/2012  *RADIOLOGY REPORT*  Clinical Data: Altered mental status  CT HEAD WITHOUT CONTRAST  Technique:  Contiguous axial images were obtained from the base of the skull through the vertex without contrast.  Comparison: 09/03/2012  Findings: Generalized cerebral volume loss compatible with atrophy. Chronic microvascular ischemia in the white matter.  Negative for acute infarct.  Negative for hemorrhage or mass lesion.  Chronic sinusitis with mucoperiosteal thickening right maxillary sinus.  IMPRESSION: Atrophy and chronic microvascular ischemia.  No acute intracranial abnormality.   Original Report Authenticated By: Camelia Phenes, M.D.     Scheduled Meds:    . allopurinol  100 mg Oral BID  . aspirin EC  81 mg Oral Daily  . calcitRIOL  0.25 mcg Oral Daily  . cephALEXin  500 mg Oral Q12H  . clopidogrel  75 mg Oral Q breakfast  . dextromethorphan-guaiFENesin  1 tablet Oral Q12H  . DULoxetine  60 mg Oral Daily  . enoxaparin (LOVENOX) injection  30 mg Subcutaneous Q24H  . fesoterodine  8 mg Oral Daily  . furosemide  40 mg Oral Daily  . ipratropium  2 spray Nasal q morning - 10a  . levothyroxine  25 mcg Oral QAC breakfast  . lisinopril  20 mg Oral Daily  . multivitamin with minerals  1 tablet Oral Daily  . neomycin-bacitracin-polymyxin   Topical BID  . omega-3 acid ethyl esters  2 g Oral Daily  . ondansetron      . ondansetron  4 mg Intramuscular Once  . pantoprazole  40 mg Oral BID  . potassium chloride  10 mEq Oral BID  . potassium chloride  40 mEq Oral Once  . simvastatin  40 mg Oral QPM  . sotalol  120 mg Oral BID  . DISCONTD: cefTRIAXone (ROCEPHIN)  IV  1 g Intravenous Q24H   Continuous Infusions:   Principal Problem:  *TIA (transient ischemic attack) Active Problems:  UTI (lower urinary tract infection)  Altered mental state    Time spent: 35 min    Jesse Hirst  [facility] Pager [phone] . If 8PM-8AM, please contact night-coverage at [email or web address], password [facility] 09/06/2012, 11:35 AM  LOS: 3 days

## 2012-09-06 NOTE — Progress Notes (Signed)
Patient had episode of confusion. Pt refused her night meds and pulled out her telemetry monitor. This nurse and Zoey, Nurse Tech were talking to the patient to let us place her back on tele but pt refused and trying to swing the tele box. Informed the Charge Nurse Arman Bogus, NP (MD on-call) about pt change in behavior and refusal to be place on monitor. Bed alarm in place. Will continue to monitor pt.

## 2012-09-06 NOTE — Progress Notes (Signed)
PT Cancellation Note  Treatment cancelled today due to patient's refusal to participate. Patient states that she just wants to sleep. Noted bouts of confusion earlier. Patientresting in bed comfortably at this time. Will attempt to see patient tomorrow as time allows 09/06/2012 Fredrich Birks PTA [phone] pager [phone] office     Fredrich Birks 09/06/2012, 2:41 PM

## 2012-09-07 LAB — URINE CULTURE

## 2012-09-07 MED ORDER — SALINE SPRAY 0.65 % NA SOLN
1.0000 | NASAL | Status: DC | PRN
  Administered 2012-09-07: 1 via NASAL
  Filled 2012-09-07: qty 44

## 2012-09-07 MED ORDER — POTASSIUM CHLORIDE ER 10 MEQ PO TBCR: 20.0000 meq | EXTENDED_RELEASE_TABLET | Freq: Two times a day (BID) | ORAL | Status: DC

## 2012-09-07 MED ORDER — POTASSIUM CHLORIDE CRYS ER 20 MEQ PO TBCR
40.0000 meq | EXTENDED_RELEASE_TABLET | Freq: Once | ORAL | Status: AC
  Administered 2012-09-07: 40 meq via ORAL
  Filled 2012-09-07 (×3): qty 2

## 2012-09-07 MED ORDER — POTASSIUM CHLORIDE ER 10 MEQ PO TBCR
20.0000 meq | EXTENDED_RELEASE_TABLET | Freq: Two times a day (BID) | ORAL | Status: DC
  Filled 2012-09-07: qty 2

## 2012-09-07 MED ORDER — CEPHALEXIN 500 MG PO CAPS: 500.0000 mg | ORAL_CAPSULE | Freq: Two times a day (BID) | ORAL | Status: DC

## 2012-09-07 MED ORDER — LISINOPRIL 20 MG PO TABS: 20.0000 mg | ORAL_TABLET | Freq: Every day | ORAL | Status: DC

## 2012-09-07 NOTE — Discharge Summary (Signed)
Physician Discharge Summary  [name] WUJ:[id number] DOB: December 08, 1930 DOA: 09/03/2012  PCP: Bridget Blamer, MD  Admit date: 09/03/2012 Discharge date: 09/07/2012  Recommendations for Outpatient Follow-up:  1. Home health- PT/OT  Discharge Diagnoses:  Principal Problem:  *TIA (transient ischemic attack) Active Problems:  UTI (lower urinary tract infection)  Altered mental state   Discharge Condition: improved  Diet recommendation: cardiac  Filed Weights   09/03/12 2330  Weight: 72.5 kg (159 lb 13.3 oz)    History of present illness:  [name] is a 78 y.o. female who unfortunately had 2 episodes earlier this evening of expressive aphasia. Patient notes she had difficulty with word finding. The first episode lasted for about 30 mins, and the second episode was noted to last about 5 mins after EMS had arrived. The patient has had similar episodes in the past few weeks and there is concern that she is having multiple TIAs which may culminate ultimately in a CVA. This diagnosis cannot be confirmed via MRI however due to a history of pacemaker placement.  Thankfully after arriving at the ED the patient was noted to be completely asymptomatic with no residual neurologic findings. Still Hospitalist service has been asked to admit the patient for observation and neuro checks as she would potentially be a candidate for TPA if she were to develop a full CVA symptomatically.   Hospital Course:  1. TIA- was recently in hospital in July for same issue- is not a candidate for MRI due to pacemaker, on ASA/plavix, not a candidate for coumadin because of falls and patient refused- PT/OT eval- home health, tele ok, await urine culture, passed swallow eval, carotid U/S ok 2. A fib- ASA/plavix- appears to be sinus on tele 3. HTN- increase BP medications 4. GERD- PPI 5. Skin tear on shin- neosporin and cover 6. ?UTI- e coli- refused IV and pulled out- change to PO abx- keflex 7. AMS- ?  From UTI, vs dementia, vs TIAs vs lack of sleep- family denies any symptoms of dementia- repeated head CT ok  8. Hypokalemia- repleat    Procedures: Study Conclusions  - Left ventricle: The cavity size was normal. Wall thickness was normal. Systolic function was normal. The estimated ejection fraction was in the range of 55% to 65%. Wall motion was normal; there were no regional wall motion abnormalities. Doppler parameters are consistent with abnormal left ventricular relaxation (grade 1 diastolic dysfunction). Doppler parameters are consistent with mildly elevated mean left atrial filling pressure. - Aortic valve: Minimal focal calcification and nodularity involving the noncoronary cusp, consistent with sclerosis. Trivial regurgitation directed centrally in the LVOT. - Left atrium: The atrium was mildly dilated. - Atrial septum: No defect or patent foramen ovale was identified on the basis of this study with only color flow dopplers. - Pulmonary arteries: PA peak pressure: 41mm Hg (S). Impressions:  - Occaisional ectopy was noted on this study, making this study technically difficult for the assessment of cardiac function. No cardiac source of embolism was identified, but cannot be ruled out on the basis of this examination. The right ventricular systolic pressure was markedly increased, but in the presence of normal right heart size most likely indicates only mild to moderate pulmonary hypertension.    Consultations:  neurology  Discharge Exam: Filed Vitals:   09/06/12 2200 09/07/12 0200 09/07/12 0600 09/07/12 0921  BP: 126/64 109/67 150/81 159/67  Pulse: 96 81 90 84  Temp: 98.4 F (36.9 C) 98.2 F (36.8 C) 98.1 F (36.7 C)  97.7 F (36.5 C)  TempSrc:    Oral  Resp: 18 18 18 19   Height:      Weight:      SpO2: 98% 97% 97% 95%    General: A+Ox3, NAD Cardiovascular: rrr Respiratory: clear ant  Discharge Instructions      Discharge Orders    Future  Orders Please Complete By Expires   Diet - low sodium heart healthy      Increase activity slowly      Discharge instructions      Comments:   Home health BMP in 2 weeks   Discharge instructions      Comments:   Can use colase daily as a stool softener       Medication List     As of 09/07/2012  1:36 PM    TAKE these medications         allopurinol 100 MG tablet   Commonly known as: ZYLOPRIM   Take 100 mg by mouth 2 (two) times daily.      aspirin EC 81 MG tablet   Take 1 tablet (81 mg total) by mouth daily.      calcitRIOL 0.25 MCG capsule   Commonly known as: ROCALTROL   Take 0.25 mcg by mouth daily.      cephALEXin 500 MG capsule   Commonly known as: KEFLEX   Take 1 capsule (500 mg total) by mouth every 12 (twelve) hours.      clopidogrel 75 MG tablet   Commonly known as: PLAVIX   Take 1 tablet (75 mg total) by mouth daily with breakfast.      dextromethorphan-guaiFENesin 30-600 MG per 12 hr tablet   Commonly known as: MUCINEX DM   Take 1 tablet by mouth every 12 (twelve) hours.      DULoxetine 60 MG capsule   Commonly known as: CYMBALTA   Take 60 mg by mouth daily.      furosemide 40 MG tablet   Commonly known as: LASIX   Take 40 mg by mouth daily.      ipratropium 0.06 % nasal spray   Commonly known as: ATROVENT   Place 2 sprays into the nose every morning.      levothyroxine 25 MCG tablet   Commonly known as: SYNTHROID, LEVOTHROID   Take 25 mcg by mouth daily.      lisinopril 20 MG tablet   Commonly known as: PRINIVIL,ZESTRIL   Take 1 tablet (20 mg total) by mouth daily.      multivitamin with minerals Tabs   Take 1 tablet by mouth daily.      omega-3 acid ethyl esters 1 G capsule   Commonly known as: LOVAZA   Take 2 g by mouth daily.      pantoprazole 40 MG tablet   Commonly known as: PROTONIX   Take 40 mg by mouth 2 (two) times daily.      potassium chloride 10 MEQ tablet   Commonly known as: K-DUR   Take 2 tablets (20 mEq total) by  mouth 2 (two) times daily.      simvastatin 40 MG tablet   Commonly known as: ZOCOR   Take 40 mg by mouth every evening.      sotalol 120 MG tablet   Commonly known as: BETAPACE   Take 120 mg by mouth 2 (two) times daily.      tolterodine 4 MG 24 hr capsule   Commonly known as: DETROL LA   Take 4  mg by mouth at bedtime.         Follow-up Information    Please follow up. (PCP in 2 weeks for BMP)           The results of significant diagnostics from this hospitalization (including imaging, microbiology, ancillary and laboratory) are listed below for reference.    Significant Diagnostic Studies: Dg Chest 2 View  09/04/2012  *RADIOLOGY REPORT*  Clinical Data: Stroke  CHEST - 2 VIEW  Comparison: 06/12/2012 and 05/20/2008  Findings: Stable asymmetric elevation of the right hemidiaphragm. Lungs are clear. Cardiopericardial silhouette is at upper limits of normal for size. Scarring in the left mid lung again noted.  Right- sided dual lead permanent pacemaker remains in place.  The patient is status post left hip replacement.  IMPRESSION: Stable exam.  No acute cardiopulmonary process.   Original Report Authenticated By: ERIC A. MANSELL, M.D.    Ct Head Wo Contrast  09/05/2012  *RADIOLOGY REPORT*  Clinical Data: Altered mental status  CT HEAD WITHOUT CONTRAST  Technique:  Contiguous axial images were obtained from the base of the skull through the vertex without contrast.  Comparison: 09/03/2012  Findings: Generalized cerebral volume loss compatible with atrophy. Chronic microvascular ischemia in the white matter.  Negative for acute infarct.  Negative for hemorrhage or mass lesion.  Chronic sinusitis with mucoperiosteal thickening right maxillary sinus.  IMPRESSION: Atrophy and chronic microvascular ischemia.  No acute intracranial abnormality.   Original Report Authenticated By: Camelia Phenes, M.D.    Ct Head Wo Contrast  09/03/2012  *RADIOLOGY REPORT*  Clinical Data: Expressive aphasia.   Headaches.  CT HEAD WITHOUT CONTRAST  Technique:  Contiguous axial images were obtained from the base of the skull through the vertex without contrast.  Comparison: None.  Findings: The brain stem, cerebellum, cerebral peduncles, thalami, basal ganglia, basilar cisterns, and ventricular system appear unremarkable.  Periventricular and corona radiata white matter hypodensities are most compatible with chronic ischemic microvascular white matter disease.  No intracranial hemorrhage, mass lesion, or acute infarction is identified.  Chronic right ethmoid sinusitis noted.  IMPRESSION:  1.  Chronic right ethmoid sinusitis. 2. Periventricular and corona radiata white matter hypodensities are most compatible with chronic ischemic microvascular white matter disease.  3.  No acute findings.   Original Report Authenticated By: Dellia Cloud, M.D.     Microbiology: Recent Results (from the past 240 hour(s))  GRAM STAIN     Status: Normal   Collection Time   09/03/12  9:22 PM      Component Value Range Status Comment   Specimen Description URINE, RANDOM   Final    Special Requests NONE   Final    Gram Stain     Final    Value: [id number]     WBC PRESENT,BOTH PMN AND MONONUCLEAR     GRAM POSITIVE COCCI IN PAIRS IN CLUSTERS     GRAM NEGATIVE RODS     Results Called to: Wendi Snipes, RN [id number]   Report Status 09/03/2012 FINAL   Final   URINE CULTURE     Status: Normal (Preliminary result)   Collection Time   09/03/12  9:23 PM      Component Value Range Status Comment   Specimen Description URINE, RANDOM   Final    Special Requests NONE   Final    Culture  Setup Time 09/04/2012 06:47   Final    Colony Count >=100,000 COLONIES/ML   Final    Culture ESCHERICHIA  COLI   Final    Report Status PENDING   Incomplete      Labs: Basic Metabolic Panel:  Lab 09/06/12 1610 09/03/12 2042  NA 138 140  K 3.2* 4.0  CL 99 104  CO2 27 28  GLUCOSE 114* 99  BUN 12 15  CREATININE 1.15* 1.11*    CALCIUM 9.9 9.4  MG -- --  PHOS -- --   Liver Function Tests: No results found for this basename: AST:5,ALT:5,ALKPHOS:5,BILITOT:5,PROT:5,ALBUMIN:5 in the last 168 hours No results found for this basename: LIPASE:5,AMYLASE:5 in the last 168 hours No results found for this basename: AMMONIA:5 in the last 168 hours CBC:  Lab 09/06/12 0535 09/03/12 2042  WBC 7.0 6.0  NEUTROABS -- --  HGB 10.8* 9.8*  HCT 33.8* 30.1*  MCV 92.6 94.1  PLT 217 203   Cardiac Enzymes: No results found for this basename: CKTOTAL:5,CKMB:5,CKMBINDEX:5,TROPONINI:5 in the last 168 hours BNP: BNP (last 3 results) No results found for this basename: PROBNP:3 in the last 8760 hours CBG: No results found for this basename: GLUCAP:5 in the last 168 hours  Time coordinating discharge: 35 minutes  Signed:  Marlin Canary  [facility] 09/07/2012, 1:36 PM

## 2012-09-07 NOTE — Progress Notes (Signed)
Discharge Note: Pt is alert and oriented, VS are stable, denies CP.  Telemetry & IV discontinued.  Discharge instructions reviewed with patient, pt verbalizes understanding.  Wheelchair transportation provided, all belongings with patient  

## 2012-09-07 NOTE — Progress Notes (Signed)
PT Cancellation Note  Patient Details Name: [name] MRN: [id number] DOB: [birth year]   Cancelled Treatment:    Reason Eval/Treat Not Completed: Other (comment) (Patient refused session today - too tired)   Vena Austria 09/07/2012, 1:33 PM [phone]

## 2012-10-24 ENCOUNTER — Encounter (INDEPENDENT_AMBULATORY_CARE_PROVIDER_SITE_OTHER): Payer: Self-pay | Admitting: General Surgery

## 2012-11-02 ENCOUNTER — Emergency Department (HOSPITAL_COMMUNITY): Payer: Medicare Other

## 2012-11-02 ENCOUNTER — Inpatient Hospital Stay (HOSPITAL_COMMUNITY)
Admission: EM | Admit: 2012-11-02 | Discharge: 2012-11-08 | DRG: 193 | Disposition: A | Discharge status: Skilled Nursing Facility | Payer: Medicare Other | Attending: Internal Medicine | Admitting: Internal Medicine

## 2012-11-02 DIAGNOSIS — J9600 Acute respiratory failure, unspecified whether with hypoxia or hypercapnia: Secondary | ICD-10-CM | POA: Diagnosis present

## 2012-11-02 DIAGNOSIS — R4182 Altered mental status, unspecified: Secondary | ICD-10-CM

## 2012-11-02 DIAGNOSIS — I959 Hypotension, unspecified: Secondary | ICD-10-CM | POA: Diagnosis not present

## 2012-11-02 DIAGNOSIS — D509 Iron deficiency anemia, unspecified: Secondary | ICD-10-CM | POA: Diagnosis present

## 2012-11-02 DIAGNOSIS — Z87891 Personal history of nicotine dependence: Secondary | ICD-10-CM

## 2012-11-02 DIAGNOSIS — F411 Generalized anxiety disorder: Secondary | ICD-10-CM | POA: Diagnosis present

## 2012-11-02 DIAGNOSIS — F329 Major depressive disorder, single episode, unspecified: Secondary | ICD-10-CM | POA: Diagnosis present

## 2012-11-02 DIAGNOSIS — G9341 Metabolic encephalopathy: Secondary | ICD-10-CM | POA: Diagnosis present

## 2012-11-02 DIAGNOSIS — G609 Hereditary and idiopathic neuropathy, unspecified: Secondary | ICD-10-CM | POA: Diagnosis present

## 2012-11-02 DIAGNOSIS — Z96659 Presence of unspecified artificial knee joint: Secondary | ICD-10-CM

## 2012-11-02 DIAGNOSIS — E876 Hypokalemia: Secondary | ICD-10-CM

## 2012-11-02 DIAGNOSIS — E039 Hypothyroidism, unspecified: Secondary | ICD-10-CM | POA: Diagnosis present

## 2012-11-02 DIAGNOSIS — R5381 Other malaise: Secondary | ICD-10-CM

## 2012-11-02 DIAGNOSIS — I4891 Unspecified atrial fibrillation: Secondary | ICD-10-CM | POA: Diagnosis present

## 2012-11-02 DIAGNOSIS — R000 Tachycardia, unspecified: Secondary | ICD-10-CM | POA: Diagnosis not present

## 2012-11-02 DIAGNOSIS — M109 Gout, unspecified: Secondary | ICD-10-CM | POA: Diagnosis present

## 2012-11-02 DIAGNOSIS — J189 Pneumonia, unspecified organism: Principal | ICD-10-CM | POA: Diagnosis present

## 2012-11-02 DIAGNOSIS — E46 Unspecified protein-calorie malnutrition: Secondary | ICD-10-CM | POA: Diagnosis present

## 2012-11-02 DIAGNOSIS — Z8673 Personal history of transient ischemic attack (TIA), and cerebral infarction without residual deficits: Secondary | ICD-10-CM

## 2012-11-02 DIAGNOSIS — R1310 Dysphagia, unspecified: Secondary | ICD-10-CM | POA: Diagnosis present

## 2012-11-02 DIAGNOSIS — Z79899 Other long term (current) drug therapy: Secondary | ICD-10-CM

## 2012-11-02 DIAGNOSIS — D649 Anemia, unspecified: Secondary | ICD-10-CM | POA: Diagnosis present

## 2012-11-02 DIAGNOSIS — R3129 Other microscopic hematuria: Secondary | ICD-10-CM

## 2012-11-02 DIAGNOSIS — R269 Unspecified abnormalities of gait and mobility: Secondary | ICD-10-CM | POA: Diagnosis present

## 2012-11-02 DIAGNOSIS — N390 Urinary tract infection, site not specified: Secondary | ICD-10-CM

## 2012-11-02 DIAGNOSIS — G459 Transient cerebral ischemic attack, unspecified: Secondary | ICD-10-CM

## 2012-11-02 DIAGNOSIS — R32 Unspecified urinary incontinence: Secondary | ICD-10-CM | POA: Diagnosis present

## 2012-11-02 DIAGNOSIS — I10 Essential (primary) hypertension: Secondary | ICD-10-CM | POA: Diagnosis present

## 2012-11-02 DIAGNOSIS — F3289 Other specified depressive episodes: Secondary | ICD-10-CM | POA: Diagnosis present

## 2012-11-02 DIAGNOSIS — Z7902 Long term (current) use of antithrombotics/antiplatelets: Secondary | ICD-10-CM

## 2012-11-02 DIAGNOSIS — E785 Hyperlipidemia, unspecified: Secondary | ICD-10-CM | POA: Diagnosis present

## 2012-11-02 DIAGNOSIS — Z950 Presence of cardiac pacemaker: Secondary | ICD-10-CM

## 2012-11-02 DIAGNOSIS — M129 Arthropathy, unspecified: Secondary | ICD-10-CM | POA: Diagnosis present

## 2012-11-02 DIAGNOSIS — K219 Gastro-esophageal reflux disease without esophagitis: Secondary | ICD-10-CM | POA: Diagnosis present

## 2012-11-02 LAB — URINALYSIS, ROUTINE W REFLEX MICROSCOPIC
Glucose, UA: NEGATIVE mg/dL
Ketones, ur: NEGATIVE mg/dL
Leukocytes, UA: NEGATIVE
pH: 5 (ref 5.0–8.0)

## 2012-11-02 LAB — CBC WITH DIFFERENTIAL/PLATELET
Basophils Absolute: 0 10*3/uL (ref 0.0–0.1)
Basophils Relative: 0 % (ref 0–1)
Lymphocytes Relative: 11 % — ABNORMAL LOW (ref 12–46)
MCHC: 31.2 g/dL (ref 30.0–36.0)
Neutro Abs: 7.1 10*3/uL (ref 1.7–7.7)
Neutrophils Relative %: 81 % — ABNORMAL HIGH (ref 43–77)
RDW: 16.7 % — ABNORMAL HIGH (ref 11.5–15.5)
WBC: 8.8 10*3/uL (ref 4.0–10.5)

## 2012-11-02 LAB — URINE MICROSCOPIC-ADD ON

## 2012-11-02 LAB — POCT I-STAT, CHEM 8
Calcium, Ion: 1.24 mmol/L (ref 1.13–1.30)
Glucose, Bld: 104 mg/dL — ABNORMAL HIGH (ref 70–99)
HCT: 30 % — ABNORMAL LOW (ref 36.0–46.0)
Hemoglobin: 10.2 g/dL — ABNORMAL LOW (ref 12.0–15.0)
Potassium: 3.3 mEq/L — ABNORMAL LOW (ref 3.5–5.1)

## 2012-11-02 LAB — COMPREHENSIVE METABOLIC PANEL
ALT: 10 U/L (ref 0–35)
AST: 23 U/L (ref 0–37)
Albumin: 3.4 g/dL — ABNORMAL LOW (ref 3.5–5.2)
Alkaline Phosphatase: 67 U/L (ref 39–117)
BUN: 14 mg/dL (ref 6–23)
Chloride: 102 mEq/L (ref 96–112)
Potassium: 3.2 mEq/L — ABNORMAL LOW (ref 3.5–5.1)
Sodium: 138 mEq/L (ref 135–145)
Total Bilirubin: 0.4 mg/dL (ref 0.3–1.2)
Total Protein: 6.3 g/dL (ref 6.0–8.3)

## 2012-11-02 LAB — LACTIC ACID, PLASMA: Lactic Acid, Venous: 1.2 mmol/L (ref 0.5–2.2)

## 2012-11-02 LAB — PROTIME-INR
INR: 1.11 (ref 0.00–1.49)
Prothrombin Time: 14.2 seconds (ref 11.6–15.2)

## 2012-11-02 MED ORDER — POTASSIUM CHLORIDE 10 MEQ/100ML IV SOLN
10.0000 meq | INTRAVENOUS | Status: AC
  Administered 2012-11-02 – 2012-11-03 (×4): 10 meq via INTRAVENOUS
  Filled 2012-11-02 (×4): qty 100

## 2012-11-02 MED ORDER — ACETAMINOPHEN 325 MG PO TABS: 650.0000 mg | ORAL_TABLET | Freq: Once | ORAL | Status: DC

## 2012-11-02 MED ORDER — SODIUM CHLORIDE 0.9 % IV SOLN
INTRAVENOUS | Status: DC
  Administered 2012-11-02 – 2012-11-04 (×6): via INTRAVENOUS

## 2012-11-02 MED ORDER — VANCOMYCIN HCL IN DEXTROSE 1-5 GM/200ML-% IV SOLN
1000.0000 mg | Freq: Once | INTRAVENOUS | Status: AC
  Administered 2012-11-02: 1000 mg via INTRAVENOUS
  Filled 2012-11-02: qty 200

## 2012-11-02 MED ORDER — MAGNESIUM SULFATE 40 MG/ML IJ SOLN
2.0000 g | Freq: Once | INTRAMUSCULAR | Status: AC
  Administered 2012-11-02: 2 g via INTRAVENOUS

## 2012-11-02 MED ORDER — ACETAMINOPHEN 650 MG RE SUPP
650.0000 mg | Freq: Once | RECTAL | Status: AC
  Administered 2012-11-02: 650 mg via RECTAL
  Filled 2012-11-02: qty 1

## 2012-11-02 MED ORDER — PIPERACILLIN-TAZOBACTAM 3.375 G IVPB
3.3750 g | Freq: Once | INTRAVENOUS | Status: AC
  Administered 2012-11-02: 3.375 g via INTRAVENOUS
  Filled 2012-11-02: qty 50

## 2012-11-02 NOTE — ED Provider Notes (Signed)
History     CSN: [id number]  Arrival date & time 11/02/12  2055   First MD Initiated Contact with Patient 11/02/12 2109      No chief complaint on file.   (Consider location/radiation/quality/duration/timing/severity/associated sxs/prior treatment) The history is provided by the patient and the EMS personnel. The history is limited by the condition of the patient.   Patient here after having diffuse whole-body weakness after vomiting this evening. Did have a cough which started yesterday. No history of fever according to her. Cough has been productive and continued until today. She took some cough medication and had emesis afterwards. She was able to walk and to her house initially but then whole-body weakness. No focal deficits noted. No photophobia or neck pain. No rashes noted. Denies any dysuria or hematuria. EMS was called and she was initially medical stroke and transported here Past Medical History  Diagnosis Date  . Hypertension   . Hyperlipidemia   . GERD (gastroesophageal reflux disease)   . Dysphagia     appt with Molly Maduro Buccini upcoming  . Chronic recurrent sinusitis   . Recurrent UTI   . Atrial fibrillation     ???  . Arthritis   . TIA (transient ischemic attack)     Past Surgical History  Procedure Date  . Pacemaker insertion   . Back surgery 1985  . Shoulder surgery 2008    Left  (Dr. Ranell Patrick)  . Total knee arthroplasty 2000, 2001    bilateral (Dr. Despina Hick), left 2000, right 2001  . Cataract extraction 1986, 2003    left 1986, right 2003  . Vaginal hysterectomy 1969  . Bilateral salpingoophorectomy 1971  . Tonsillectomy 1951  . Appendectomy 1952  . Mastoidectomy 1970  . Sinus exploration 1972  . Knee arthroscopy 1988, 1995, 2000  . Hemorrhoid surgery 2009  . Repair / reconstruction interphalangeal joint 2004    R thumb  . Cystoscopy 1992  . Insert / replace / remove pacemaker     No family history on file.  History  Substance Use Topics  .  Smoking status: Former Smoker -- 1.0 packs/day for 15 years    Types: Cigarettes    Quit date: 12/04/1981  . Smokeless tobacco: Not on file  . Alcohol Use: No    OB History    Grav Para Term Preterm Abortions TAB SAB Ect Mult Living                  Review of Systems  Unable to perform ROS   Allergies  Review of patient's allergies indicates no known allergies.  Home Medications   Current Outpatient Rx  Name  Route  Sig  Dispense  Refill  . ALLOPURINOL 100 MG PO TABS   Oral   Take 100 mg by mouth 2 (two) times daily.         . ASPIRIN EC 81 MG PO TBEC   Oral   Take 1 tablet (81 mg total) by mouth daily.   1 tablet   12   . CALCITRIOL 0.25 MCG PO CAPS   Oral   Take 0.25 mcg by mouth daily.         . CEPHALEXIN 500 MG PO CAPS   Oral   Take 1 capsule (500 mg total) by mouth every 12 (twelve) hours.   14 capsule   0   . CLOPIDOGREL BISULFATE 75 MG PO TABS   Oral   Take 1 tablet (75 mg total) by mouth daily with  breakfast.   30 tablet   0   . DM-GUAIFENESIN ER 30-600 MG PO TB12   Oral   Take 1 tablet by mouth every 12 (twelve) hours.         . DULOXETINE HCL 60 MG PO CPEP   Oral   Take 60 mg by mouth daily.         . FUROSEMIDE 40 MG PO TABS   Oral   Take 40 mg by mouth daily.         . IPRATROPIUM BROMIDE 0.06 % NA SOLN   Nasal   Place 2 sprays into the nose every morning.         Marland Kitchen LEVOTHYROXINE SODIUM 25 MCG PO TABS   Oral   Take 25 mcg by mouth daily.         Marland Kitchen LISINOPRIL 20 MG PO TABS   Oral   Take 1 tablet (20 mg total) by mouth daily.   30 tablet   0   . ADULT MULTIVITAMIN W/MINERALS CH   Oral   Take 1 tablet by mouth daily.         . OMEGA-3-ACID ETHYL ESTERS 1 G PO CAPS   Oral   Take 2 g by mouth daily.         Marland Kitchen PANTOPRAZOLE SODIUM 40 MG PO TBEC   Oral   Take 40 mg by mouth 2 (two) times daily.         Marland Kitchen POTASSIUM CHLORIDE ER 10 MEQ PO TBCR   Oral   Take 2 tablets (20 mEq total) by mouth 2 (two) times  daily.   30 tablet   0   . SIMVASTATIN 40 MG PO TABS   Oral   Take 40 mg by mouth every evening.         Marland Kitchen SOTALOL HCL 120 MG PO TABS   Oral   Take 120 mg by mouth 2 (two) times daily.         . TOLTERODINE TARTRATE ER 4 MG PO CP24   Oral   Take 4 mg by mouth at bedtime.           There were no vitals taken for this visit.  Physical Exam  Nursing note and vitals reviewed. Constitutional: She is oriented to person, place, and time. She appears well-developed and well-nourished. She appears lethargic.  Non-toxic appearance. No distress.  HENT:  Head: Normocephalic and atraumatic.  Eyes: Conjunctivae normal, EOM and lids are normal. Pupils are equal, round, and reactive to light.  Neck: Normal range of motion. Neck supple. No tracheal deviation present. No mass present.  Cardiovascular: Normal rate, regular rhythm and normal heart sounds.  Exam reveals no gallop.   No murmur heard. Pulmonary/Chest: Effort normal. No stridor. No respiratory distress. She has decreased breath sounds. She has no wheezes. She has no rhonchi. She has no rales.  Abdominal: Soft. Normal appearance and bowel sounds are normal. She exhibits no distension. There is no tenderness. There is no rebound and no CVA tenderness.  Musculoskeletal: Normal range of motion. She exhibits no edema and no tenderness.  Neurological: She is oriented to person, place, and time. She has normal strength. She appears lethargic. No cranial nerve deficit or sensory deficit. She displays no seizure activity. GCS eye subscore is 4. GCS verbal subscore is 5. GCS motor subscore is 6.  Skin: Skin is warm and dry. No abrasion and no rash noted.  Psychiatric: Her affect is blunt. Her speech  is delayed. She is slowed.    ED Course  Procedures (including critical care time)   Labs Reviewed  PROTIME-INR  URINE RAPID DRUG SCREEN (HOSP PERFORMED)   Ct Head Wo Contrast  11/02/2012  *RADIOLOGY REPORT*  Clinical Data: Confusion  and inability to walk.  CT HEAD WITHOUT CONTRAST  Technique:  Contiguous axial images were obtained from the base of the skull through the vertex without contrast.  Comparison: 09/05/2012  Findings: Stable atrophy and small vessel disease. The brain demonstrates no evidence of hemorrhage, infarction, edema, mass effect, extra-axial fluid collection, hydrocephalus or mass lesion. The skull is unremarkable.  IMPRESSION: Stable atrophy and small vessel disease.  No acute findings.   Original Report Authenticated By: Irish Lack, M.D.      No diagnosis found.    MDM  Patient with temperature 102.4. Tylenol was given and patient defervesced. Code stroke was canceled due to the non-focality of her exam and her history of cough. Chest x-ray consistent with pneumonia left lower lobe. Blood cultures obtained. She was started on IV antibiotics and she will be admitted to the hospital. No concern for CVA at this time. She has a nonfocal exam.  CRITICAL CARE Performed by: Toy Baker   Total critical care time: 65  Critical care time was exclusive of separately billable procedures and treating other patients.  Critical care was necessary to treat or prevent imminent or life-threatening deterioration.  Critical care was time spent personally by me on the following activities: development of treatment plan with patient and/or surrogate as well as nursing, discussions with consultants, evaluation of patient's response to treatment, examination of patient, obtaining history from patient or surrogate, ordering and performing treatments and interventions, ordering and review of laboratory studies, ordering and review of radiographic studies, pulse oximetry and re-evaluation of patient's condition.        Toy Baker, MD 11/02/12 [phone]

## 2012-11-02 NOTE — Progress Notes (Signed)
78 yo wf brought in via [facility] for [facility], code stroke was NOT called.  Pt was traveling home from out of town with her family and developed a cough that required administration of OTC cough med.  After the cough med was given the pt vomited. Pt arrived home at 1800 & ambulated without assistance into the house & sat on the couch. At roughly 1930 the pt was unable to stand or walk without max assist & was assisted upstairs to bed.  At this time her speech was found to be garbled & EMS was called.  On arrival to [facility] the pt was febrile 102.1 rectal & she was lethargic but arousable. Pt oriented to self & place.  Pt's speech is clear but quiet.  Pt able to MAE but generalized weakness.  No focal deficits noted. NIH 4 for BLE weak, drowsy, & not oriented to time.  Dr. Freida Busman updated.

## 2012-11-02 NOTE — H&P (Signed)
[facility] ADMISSION NOTE  Date: 11/02/2012  Patient name:  [name]  Medical record number:  [id number] Date of birth:  06/16/1931  Age: 78 y.o. Gender:  female PCP:    Johny Blamer, MD  Medical Service: Internal Medicine     Chief Complaint: generalized malaise and weakness  History of Present Illness: Patient is a 78 y.o. female with a PMHx of recent TIAs who was brought in by EMS after being called for chief complaints of weakness and unable to walk. She was apparently well up until last night when she started having some cough. Cough is associated with dark green phlegm. Patient continued to cough all night long. On the morning of admission the patient's cough got worse and was associated with vomiting. 4-5 episodes of vomiting which contained dark green colored vomitus. Patient also developed fever which was found to be 102. Patient was weak and unable to walk even with the help of a walker which is unusual for her. Patient was assisted by her son (who she stays with) to her room but developed confusion and was unable to speak or answer properly. Given that patient had recent episodes of TIA and recently finished physical therapy for the same the son got concerned about a stroke and hence called EMS. Code stroke was canceled given that patient had high-grade fevers in the ER.    Current Outpatient Medications: Current Facility-Administered Medications  Medication Dose Route Frequency Provider Last Rate Last Dose  . 0.9 %  sodium chloride infusion   Intravenous Continuous Toy Baker, MD 125 mL/hr at 11/02/12 2159    . [COMPLETED] acetaminophen (TYLENOL) suppository 650 mg  650 mg Rectal Once Hurman Horn, MD   650 mg at 11/02/12 2203  . [COMPLETED] piperacillin-tazobactam (ZOSYN) IVPB 3.375 g  3.375 g Intravenous Once Toy Baker, MD   3.375 g at 11/02/12 2213  . vancomycin (VANCOCIN) IVPB 1000 mg/200 mL premix  1,000 mg Intravenous Once Toy Baker, MD 200  mL/hr at 11/02/12 2312 1,000 mg at 11/02/12 2312  . [DISCONTINUED] acetaminophen (TYLENOL) tablet 650 mg  650 mg Oral Once Toy Baker, MD       Current Outpatient Prescriptions  Medication Sig Dispense Refill  . allopurinol (ZYLOPRIM) 100 MG tablet Take 100 mg by mouth 2 (two) times daily.      . calcitRIOL (ROCALTROL) 0.25 MCG capsule Take 0.25 mcg by mouth daily.      . clopidogrel (PLAVIX) 75 MG tablet Take 75 mg by mouth daily with breakfast.      . CRANBERRY PO Take 2 capsules by mouth 2 (two) times daily.      Marland Kitchen dextromethorphan-guaiFENesin (MUCINEX DM) 30-600 MG per 12 hr tablet Take 1 tablet by mouth every 12 (twelve) hours.      . DULoxetine (CYMBALTA) 60 MG capsule Take 60 mg by mouth daily.      Marland Kitchen ipratropium (ATROVENT) 0.06 % nasal spray Place 2 sprays into the nose every morning.      Marland Kitchen levothyroxine (SYNTHROID, LEVOTHROID) 25 MCG tablet Take 25 mcg by mouth daily.      Marland Kitchen lisinopril (PRINIVIL,ZESTRIL) 20 MG tablet Take 20 mg by mouth daily.      . Multiple Vitamin (MULTIVITAMIN WITH MINERALS) TABS Take 1 tablet by mouth daily.      Marland Kitchen omega-3 acid ethyl esters (LOVAZA) 1 G capsule Take 2 g by mouth daily.      . pantoprazole (PROTONIX) 40 MG tablet Take  40 mg by mouth 2 (two) times daily.      . potassium chloride (K-DUR) 10 MEQ tablet Take 20 mEq by mouth 2 (two) times daily.      . simvastatin (ZOCOR) 40 MG tablet Take 40 mg by mouth every evening.      . sotalol (BETAPACE) 120 MG tablet Take 120 mg by mouth 2 (two) times daily.      Marland Kitchen tolterodine (DETROL LA) 4 MG 24 hr capsule Take 4 mg by mouth at bedtime.      . [DISCONTINUED] lisinopril (PRINIVIL,ZESTRIL) 20 MG tablet Take 1 tablet (20 mg total) by mouth daily.  30 tablet  0  . [DISCONTINUED] potassium chloride (K-DUR) 10 MEQ tablet Take 2 tablets (20 mEq total) by mouth 2 (two) times daily.  30 tablet  0    Allergies: Review of patient's allergies indicates no known allergies.  Past Medical History: Past Medical  History  Diagnosis Date  . Hypertension   . Hyperlipidemia   . GERD (gastroesophageal reflux disease)   . Dysphagia     appt with Molly Maduro Buccini upcoming  . Chronic recurrent sinusitis   . Recurrent UTI   . Atrial fibrillation     ???  . Arthritis   . TIA (transient ischemic attack)     Past Surgical History: Past Surgical History  Procedure Date  . Pacemaker insertion   . Back surgery 1985  . Shoulder surgery 2008    Left  (Dr. Ranell Patrick)  . Total knee arthroplasty 2000, 2001    bilateral (Dr. Despina Hick), left 2000, right 2001  . Cataract extraction 1986, 2003    left 1986, right 2003  . Vaginal hysterectomy 1969  . Bilateral salpingoophorectomy 1971  . Tonsillectomy 1951  . Appendectomy 1952  . Mastoidectomy 1970  . Sinus exploration 1972  . Knee arthroscopy 1988, 1995, 2000  . Hemorrhoid surgery 2009  . Repair / reconstruction interphalangeal joint 2004    R thumb  . Cystoscopy 1992  . Insert / replace / remove pacemaker     Family History: No family history on file.  Social History: History   Social History  . Marital Status: Divorced    Spouse Name: N/A    Number of Children: 5  . Years of Education: N/A   Occupational History  . Print production planner     retired   Social History Main Topics  . Smoking status: Former Smoker -- 1.0 packs/day for 15 years    Types: Cigarettes    Quit date: 12/04/1981  . Smokeless tobacco: Not on file  . Alcohol Use: No  . Drug Use: No  . Sexually Active: Not on file   Other Topics Concern  . Not on file   Social History Narrative  . No narrative on file    Review of Systems: Pertinent items are noted in HPI.  Vital Signs: T: 102.4 P: 77 BP: 169/50 RR: 24 O2 sat: 99 % on 4 litres   Physical Exam: General: Vital signs reviewed and noted. Well-developed, well-nourished, in no acute distress; alert, lethargic, appropriate and cooperative throughout examination.  Head: Normocephalic, atraumatic.  Eyes: PERRL, EOMI, No  signs of anemia or jaundince.  Ears: TM nonerythematous, not bulging, good light reflex bilaterally.  Nose: Mucous membranes moist, not inflammed, nonerythematous.  Throat: Oropharynx nonerythematous, no exudate appreciated.   Neck: No deformities, masses, or tenderness noted.Supple, No carotid Bruits, no JVD.  Lungs:  Normal respiratory effort. Decreased breath sounds in left lower lobe. No  crackles or wheezes.  Heart: RRR. S1 and S2 normal without gallop, murmur, or rubs.  Abdomen:  BS normoactive. Soft, Nondistended, non-tender.  No masses or organomegaly.  Extremities: No pretibial edema.  Neurologic: A&O X3, CN II - XII are grossly intact. Motor strength is 5/5 in the all 4 extremities, Sensations intact to light touch, Cerebellar signs negative.  Skin: No visible rashes, scars.   Lab results: CBC:    Component Value Date/Time   WBC 8.8 11/02/2012 2137   HGB 8.8* 11/02/2012 2137   HCT 28.2* 11/02/2012 2137   PLT 186 11/02/2012 2137   MCV 91.9 11/02/2012 2137   NEUTROABS 7.1 11/02/2012 2137   LYMPHSABS 1.0 11/02/2012 2137   MONOABS 0.7 11/02/2012 2137   EOSABS 0.1 11/02/2012 2137   BASOSABS 0.0 11/02/2012 2137      Comprehensive Metabolic Panel:    Component Value Date/Time   NA 138 11/02/2012 2137   K 3.2* 11/02/2012 2137   CL 102 11/02/2012 2137   CO2 25 11/02/2012 2137   BUN 14 11/02/2012 2137   CREATININE 0.97 11/02/2012 2137   GLUCOSE 104* 11/02/2012 2137   CALCIUM 9.6 11/02/2012 2137   AST 23 11/02/2012 2137   ALT 10 11/02/2012 2137   ALKPHOS 67 11/02/2012 2137   BILITOT 0.4 11/02/2012 2137   PROT 6.3 11/02/2012 2137   ALBUMIN 3.4* 11/02/2012 2137     Lab Results  Component Value Date   TROPONINI <0.30 06/11/2012      Imaging results:  CXR 11/02/12: Findings: Lung volumes are extremely low. There is chronic elevation of the right hemidiaphragm. No overt edema identified. There is increased density at the left lung base that partially obscures the left  hemidiaphragm. This may be consistent with acute pneumonia. The heart size is stable. There is a stable appearance to a dual chamber pacemaker.   IMPRESSION:  Possible left lower lung pneumonia.    Assessment & Plan: # CAP:  - Admit to step down bed as patient's blood pressure was in the lower side(100's/60's) in ER - Obtain sputum cultures, urine for streptococcal and legionella antigen - Follow up blood cultures and urine cultures - Hydration - Continue ceftriaxone and zithromax for community-acquired pneumonia   # HTN: BP stable- recent readings are getting on softer side.  - Continue to monitor for now. - Hold lisinopril for now.  # Atrial fibrillation: Continue sotalol and plavix. Not a coumadin candidate because of risk of fall.    # Normocytic anemia: iron-28, ferritin- 12 from iron panel in 07/13. Baseline Hb~9.5-10.  - Continue to monitor for now  # Hypokalemia: likely 2/2 nausea and vomiting. - Replete K and Mg -Followup bmp in a.m.  # Hypothyroidism: Last  TSH- 2.45 in 07/13.Continue synthroid.  # Depression/Anxiety: Continue cymbalata  # Hyperlipidemia: Last LDL- 92, HDL -35 per lipid panel in 10/13. - Continue zocor at current dose  # Gout, hx of: Continue allopurinol.  # History of TIA: Continue plavix.   # DVT PPX: Lovenox   Lars Mage, M.D.  Date/ Time:  11:50 PM,11/02/2012

## 2012-11-02 NOTE — ED Notes (Signed)
Blood cultures drawn and sent for analysis/.

## 2012-11-03 ENCOUNTER — Encounter (HOSPITAL_COMMUNITY): Payer: Self-pay | Admitting: *Deleted

## 2012-11-03 ENCOUNTER — Inpatient Hospital Stay (HOSPITAL_COMMUNITY): Payer: Medicare Other

## 2012-11-03 LAB — CBC
MCH: 29.5 pg (ref 26.0–34.0)
MCHC: 31.7 g/dL (ref 30.0–36.0)
Platelets: 157 10*3/uL (ref 150–400)
RDW: 17.2 % — ABNORMAL HIGH (ref 11.5–15.5)

## 2012-11-03 LAB — COMPREHENSIVE METABOLIC PANEL
ALT: 9 U/L (ref 0–35)
AST: 22 U/L (ref 0–37)
Alkaline Phosphatase: 52 U/L (ref 39–117)
CO2: 24 mEq/L (ref 19–32)
Chloride: 106 mEq/L (ref 96–112)
Creatinine, Ser: 1.1 mg/dL (ref 0.50–1.10)
GFR calc non Af Amer: 46 mL/min — ABNORMAL LOW (ref >90)
Potassium: 4.8 mEq/L (ref 3.5–5.1)
Sodium: 139 mEq/L (ref 135–145)
Total Bilirubin: 0.3 mg/dL (ref 0.3–1.2)

## 2012-11-03 LAB — EXPECTORATED SPUTUM ASSESSMENT W GRAM STAIN, RFLX TO RESP C

## 2012-11-03 LAB — STREP PNEUMONIAE URINARY ANTIGEN: Strep Pneumo Urinary Antigen: NEGATIVE

## 2012-11-03 MED ORDER — SOTALOL HCL 80 MG PO TABS
80.0000 mg | ORAL_TABLET | Freq: Every day | ORAL | Status: DC
  Filled 2012-11-03: qty 1

## 2012-11-03 MED ORDER — IBUPROFEN 200 MG PO TABS
200.0000 mg | ORAL_TABLET | Freq: Three times a day (TID) | ORAL | Status: DC | PRN
  Administered 2012-11-03 – 2012-11-08 (×3): 200 mg via ORAL
  Filled 2012-11-03 (×3): qty 1

## 2012-11-03 MED ORDER — VANCOMYCIN HCL 1000 MG IV SOLR
750.0000 mg | Freq: Two times a day (BID) | INTRAVENOUS | Status: DC
  Administered 2012-11-03 – 2012-11-06 (×8): 750 mg via INTRAVENOUS
  Filled 2012-11-03 (×10): qty 750

## 2012-11-03 MED ORDER — FESOTERODINE FUMARATE ER 4 MG PO TB24
4.0000 mg | ORAL_TABLET | Freq: Every day | ORAL | Status: DC
  Administered 2012-11-03 – 2012-11-07 (×5): 4 mg via ORAL
  Filled 2012-11-03 (×7): qty 1

## 2012-11-03 MED ORDER — POLYSACCHARIDE IRON COMPLEX 150 MG PO CAPS
150.0000 mg | ORAL_CAPSULE | Freq: Two times a day (BID) | ORAL | Status: DC
  Administered 2012-11-04 – 2012-11-08 (×9): 150 mg via ORAL
  Filled 2012-11-03 (×11): qty 1

## 2012-11-03 MED ORDER — SOTALOL HCL 120 MG PO TABS
120.0000 mg | ORAL_TABLET | Freq: Two times a day (BID) | ORAL | Status: DC
  Administered 2012-11-03: 120 mg via ORAL
  Filled 2012-11-03 (×3): qty 1

## 2012-11-03 MED ORDER — ENSURE COMPLETE PO LIQD
237.0000 mL | Freq: Three times a day (TID) | ORAL | Status: DC
  Administered 2012-11-03 (×2): 237 mL via ORAL

## 2012-11-03 MED ORDER — ACETAMINOPHEN 325 MG PO TABS
650.0000 mg | ORAL_TABLET | Freq: Four times a day (QID) | ORAL | Status: DC | PRN
  Administered 2012-11-03 (×2): 650 mg via ORAL
  Filled 2012-11-03 (×4): qty 2

## 2012-11-03 MED ORDER — DULOXETINE HCL 60 MG PO CPEP
60.0000 mg | ORAL_CAPSULE | Freq: Every day | ORAL | Status: DC
  Administered 2012-11-03 – 2012-11-08 (×6): 60 mg via ORAL
  Filled 2012-11-03 (×6): qty 1

## 2012-11-03 MED ORDER — ONDANSETRON HCL 4 MG/2ML IJ SOLN
4.0000 mg | Freq: Four times a day (QID) | INTRAMUSCULAR | Status: DC | PRN
  Administered 2012-11-03 – 2012-11-07 (×2): 4 mg via INTRAVENOUS
  Filled 2012-11-03 (×3): qty 2

## 2012-11-03 MED ORDER — DEXTROSE 5 % IV SOLN
2.0000 g | INTRAVENOUS | Status: DC
  Administered 2012-11-03 – 2012-11-06 (×4): 2 g via INTRAVENOUS
  Filled 2012-11-03 (×4): qty 2

## 2012-11-03 MED ORDER — DM-GUAIFENESIN ER 30-600 MG PO TB12
1.0000 | ORAL_TABLET | Freq: Two times a day (BID) | ORAL | Status: DC
  Filled 2012-11-03 (×2): qty 1

## 2012-11-03 MED ORDER — DEXTROSE 5 % IV SOLN
1.0000 g | Freq: Two times a day (BID) | INTRAVENOUS | Status: DC
  Administered 2012-11-03: 1 g via INTRAVENOUS
  Filled 2012-11-03 (×3): qty 10

## 2012-11-03 MED ORDER — ONDANSETRON HCL 4 MG PO TABS: 4.0000 mg | ORAL_TABLET | Freq: Four times a day (QID) | ORAL | Status: DC | PRN

## 2012-11-03 MED ORDER — SODIUM CHLORIDE 0.9 % IV BOLUS (SEPSIS)
250.0000 mL | Freq: Once | INTRAVENOUS | Status: AC
  Administered 2012-11-03: 250 mL via INTRAVENOUS

## 2012-11-03 MED ORDER — WHITE PETROLATUM GEL
Status: AC
  Administered 2012-11-03: 0.2
  Filled 2012-11-03: qty 5

## 2012-11-03 MED ORDER — SOTALOL HCL 80 MG PO TABS
80.0000 mg | ORAL_TABLET | Freq: Two times a day (BID) | ORAL | Status: DC
  Filled 2012-11-03: qty 1

## 2012-11-03 MED ORDER — SODIUM CHLORIDE 0.9 % IV SOLN
INTRAVENOUS | Status: DC
  Administered 2012-11-03 (×2): via INTRAVENOUS

## 2012-11-03 MED ORDER — LEVOFLOXACIN IN D5W 750 MG/150ML IV SOLN
750.0000 mg | INTRAVENOUS | Status: DC
  Administered 2012-11-03: 750 mg via INTRAVENOUS
  Filled 2012-11-03 (×2): qty 150

## 2012-11-03 MED ORDER — PANTOPRAZOLE SODIUM 40 MG PO TBEC
40.0000 mg | DELAYED_RELEASE_TABLET | Freq: Two times a day (BID) | ORAL | Status: DC
  Administered 2012-11-03 – 2012-11-08 (×11): 40 mg via ORAL
  Filled 2012-11-03 (×10): qty 1

## 2012-11-03 MED ORDER — ACETAMINOPHEN-CODEINE #3 300-30 MG PO TABS: 1.0000 | ORAL_TABLET | Freq: Four times a day (QID) | ORAL | Status: DC | PRN

## 2012-11-03 MED ORDER — GUAIFENESIN ER 600 MG PO TB12
600.0000 mg | ORAL_TABLET | Freq: Two times a day (BID) | ORAL | Status: DC
  Administered 2012-11-03 – 2012-11-08 (×11): 600 mg via ORAL
  Filled 2012-11-03 (×12): qty 1

## 2012-11-03 MED ORDER — ALLOPURINOL 100 MG PO TABS
100.0000 mg | ORAL_TABLET | Freq: Two times a day (BID) | ORAL | Status: DC
  Administered 2012-11-03 – 2012-11-08 (×11): 100 mg via ORAL
  Filled 2012-11-03 (×12): qty 1

## 2012-11-03 MED ORDER — SIMVASTATIN 40 MG PO TABS
40.0000 mg | ORAL_TABLET | Freq: Every evening | ORAL | Status: DC
  Administered 2012-11-03 – 2012-11-07 (×5): 40 mg via ORAL
  Filled 2012-11-03 (×6): qty 1

## 2012-11-03 MED ORDER — DEXTROSE 5 % IV SOLN: 1.0000 g | Freq: Three times a day (TID) | INTRAVENOUS | Status: DC

## 2012-11-03 MED ORDER — DEXTROSE 5 % IV SOLN
500.0000 mg | INTRAVENOUS | Status: DC
  Administered 2012-11-03: 500 mg via INTRAVENOUS
  Filled 2012-11-03: qty 500

## 2012-11-03 MED ORDER — CALCITRIOL 0.25 MCG PO CAPS
0.2500 ug | ORAL_CAPSULE | Freq: Every day | ORAL | Status: DC
  Administered 2012-11-03 – 2012-11-08 (×6): 0.25 ug via ORAL
  Filled 2012-11-03 (×6): qty 1

## 2012-11-03 MED ORDER — ENOXAPARIN SODIUM 40 MG/0.4ML ~~LOC~~ SOLN
40.0000 mg | SUBCUTANEOUS | Status: DC
  Administered 2012-11-03 – 2012-11-08 (×6): 40 mg via SUBCUTANEOUS
  Filled 2012-11-03 (×6): qty 0.4

## 2012-11-03 MED ORDER — CLOPIDOGREL BISULFATE 75 MG PO TABS
75.0000 mg | ORAL_TABLET | Freq: Every day | ORAL | Status: DC
  Administered 2012-11-03 – 2012-11-08 (×6): 75 mg via ORAL
  Filled 2012-11-03 (×7): qty 1

## 2012-11-03 MED ORDER — LEVOTHYROXINE SODIUM 25 MCG PO TABS
25.0000 ug | ORAL_TABLET | Freq: Every day | ORAL | Status: DC
  Administered 2012-11-03 – 2012-11-08 (×6): 25 ug via ORAL
  Filled 2012-11-03 (×9): qty 1

## 2012-11-03 NOTE — Progress Notes (Signed)
ANTIBIOTIC CONSULT NOTE - INITIAL  Pharmacy Consult for Vancomycin, Levaquin, Cefepime  Indication: Suspected PNA   No Known Allergies  Patient Measurements: Height: 5\' 5"  (165.1 cm) Weight: 159 lb 2.8 oz (72.2 kg) IBW/kg (Calculated) : 57    Vital Signs: Temp: 98.8 F (37.1 C) (12/01 0700) Temp src: Oral (12/01 0700) BP: 84/32 mmHg (12/01 1019) Pulse Rate: 69  (12/01 1019) Intake/Output from previous day: 11/30 0701 - 12/01 0700 In: 1575.4 [I.V.:1275.4; IV Piggyback:300] Out: 275 [Urine:275] Intake/Output from this shift: Total I/O In: 350 [P.O.:50; I.V.:300] Out: -   Labs:  Basename 11/03/12 0443 11/02/12 2137 11/02/12 2132  WBC 11.7* 8.8 --  HGB 8.0* 8.8* 10.2*  PLT 157 186 --  LABCREA -- -- --  CREATININE 1.10 0.97 1.20*   Estimated Creatinine Clearance: 41.3 ml/min (by C-G formula based on Cr of 1.1). No results found for this basename: VANCOTROUGH:2,VANCOPEAK:2,VANCORANDOM:2,GENTTROUGH:2,GENTPEAK:2,GENTRANDOM:2,TOBRATROUGH:2,TOBRAPEAK:2,TOBRARND:2,AMIKACINPEAK:2,AMIKACINTROU:2,AMIKACIN:2, in the last 72 hours   Microbiology: Recent Results (from the past 720 hour(s))  MRSA PCR SCREENING     Status: Normal   Collection Time   11/03/12 12:54 AM      Component Value Range Status Comment   MRSA by PCR NEGATIVE  NEGATIVE Final   CULTURE, EXPECTORATED SPUTUM-ASSESSMENT     Status: Normal   Collection Time   11/03/12  2:48 AM      Component Value Range Status Comment   Specimen Description SPU   Final    Special Requests Normal   Final    Sputum evaluation     Final    Value: THIS SPECIMEN IS ACCEPTABLE. RESPIRATORY CULTURE REPORT TO FOLLOW.   Report Status 11/03/2012 FINAL   Final     Medical History: Past Medical History  Diagnosis Date  . Hypertension   . Hyperlipidemia   . GERD (gastroesophageal reflux disease)   . Dysphagia     appt with Molly Maduro Buccini upcoming  . Chronic recurrent sinusitis   . Recurrent UTI   . Atrial fibrillation     ???  .  Arthritis   . TIA (transient ischemic attack)    Assessment: 77 yo F admitted 11/30 for suspected pneumonia. Pharmacy consulted to renally adjust and dose Vanc/Cefepime/Levaquin.  CXR 11/02/12>> possible left lower lung pneumonia. WBC 11.7 today. Pt febrile upon admit with a temp of 102.4 but now afebrile. Pt received 1 dose of Vanc, Zosyn, Rocephin and Zithromax prior to current abx. Now to continue on Vancomycin, Cefepime and Levaquin with pharmacy to adjust for renal function.   Goal of Therapy:  Vancomycin trough level 15-20 mcg/ml  Plan:  -Start Vancomycin 750mg  IV q12h -Continue Cefepime 2g IV q24h -Continue Levaquin 750 q48h - Monitor renal function - F/U cultures and narrowing of therapy   Thank you, Franchot Erichsen, Pharm.D. Clinical Pharmacist  11/03/2012 10:40 AM

## 2012-11-03 NOTE — Progress Notes (Addendum)
Subjective: Patient still coughing greenish colored sputum.  Feeling weak.  Had nausea and vomiting this morning.  Objective: Vital signs in last 24 hours: Filed Vitals:   11/03/12 0400 11/03/12 0414 11/03/12 0700 11/03/12 0730  BP:  108/52  110/57  Pulse: 59 64  68  Temp:  97.8 F (36.6 C) 98.8 F (37.1 C)   TempSrc:  Oral Oral   Resp: 24 24  20   Height:      Weight:      SpO2: 99% 99%  98%   Weight change:   Intake/Output Summary (Last 24 hours) at 11/03/12 0952 Last data filed at 11/03/12 0800  Gross per 24 hour  Intake 1775.42 ml  Output    275 ml  Net 1500.42 ml    Physical Exam: General: Awake, Oriented, No acute distress. HEENT: EOMI. Neck: Supple CV: S1 and S2 Lungs: Clear to ascultation bilaterally, crackles at bases bilaterally. Abdomen: Soft, Nontender, Nondistended, +bowel sounds. Ext: Good pulses. Trace edema.  Lab Results: Basic Metabolic Panel:  Lab 11/03/12 1610 11/02/12 2137 11/02/12 2132  NA 139 138 141  K 4.8 3.2* 3.3*  CL 106 102 104  CO2 24 25 --  GLUCOSE 127* 104* 104*  BUN 15 14 14   CREATININE 1.10 0.97 1.20*  CALCIUM 8.3* 9.6 --  MG -- -- --  PHOS -- -- --   Liver Function Tests:  Lab 11/03/12 0443 11/02/12 2137  AST 22 23  ALT 9 10  ALKPHOS 52 67  BILITOT 0.3 0.4  PROT 5.3* 6.3  ALBUMIN 2.6* 3.4*   No results found for this basename: LIPASE:5,AMYLASE:5 in the last 168 hours No results found for this basename: AMMONIA:5 in the last 168 hours CBC:  Lab 11/03/12 0443 11/02/12 2137 11/02/12 2132  WBC 11.7* 8.8 --  NEUTROABS -- 7.1 --  HGB 8.0* 8.8* 10.2*  HCT 25.2* 28.2* 30.0*  MCV 93.0 91.9 --  PLT 157 186 --   Cardiac Enzymes: No results found for this basename: CKTOTAL:5,CKMB:5,CKMBINDEX:5,TROPONINI:5 in the last 168 hours BNP (last 3 results) No results found for this basename: PROBNP:3 in the last 8760 hours CBG:  Lab 11/02/12 2136  GLUCAP 104*   No results found for this basename: HGBA1C:5 in the last 72  hours Other Labs: No components found with this basename: POCBNP:3 No results found for this basename: DDIMER:2 in the last 168 hours No results found for this basename: CHOL:2,HDL:2,LDLCALC:2,TRIG:2,CHOLHDL:2,LDLDIRECT:2 in the last 168 hours No results found for this basename: TSH,T4TOTAL,FREET3,T3FREE,FREET4,THYROIDAB in the last 168 hours No results found for this basename: VITAMINB12:2,FOLATE:2,FERRITIN:2,TIBC:2,IRON:2,RETICCTPCT:2 in the last 168 hours  Micro Results: Recent Results (from the past 240 hour(s))  MRSA PCR SCREENING     Status: Normal   Collection Time   11/03/12 12:54 AM      Component Value Range Status Comment   MRSA by PCR NEGATIVE  NEGATIVE Final   CULTURE, EXPECTORATED SPUTUM-ASSESSMENT     Status: Normal   Collection Time   11/03/12  2:48 AM      Component Value Range Status Comment   Specimen Description SPU   Final    Special Requests Normal   Final    Sputum evaluation     Final    Value: THIS SPECIMEN IS ACCEPTABLE. RESPIRATORY CULTURE REPORT TO FOLLOW.   Report Status 11/03/2012 FINAL   Final     Studies/Results: Ct Head Wo Contrast  11/02/2012  *RADIOLOGY REPORT*  Clinical Data: Confusion and inability to walk.  CT HEAD WITHOUT  CONTRAST  Technique:  Contiguous axial images were obtained from the base of the skull through the vertex without contrast.  Comparison: 09/05/2012  Findings: Stable atrophy and small vessel disease. The brain demonstrates no evidence of hemorrhage, infarction, edema, mass effect, extra-axial fluid collection, hydrocephalus or mass lesion. The skull is unremarkable.  IMPRESSION: Stable atrophy and small vessel disease.  No acute findings.   Original Report Authenticated By: Irish Lack, M.D.    Dg Chest Port 1 View  11/02/2012  *RADIOLOGY REPORT*  Clinical Data: Cough, congestion and shortness of breath.  PORTABLE CHEST - 1 VIEW  Comparison: 09/04/2012  Findings: Lung volumes are extremely low.  There is chronic elevation of  the right hemidiaphragm.  No overt edema identified. There is increased density at the left lung base that partially obscures the left hemidiaphragm.  This may be consistent with acute pneumonia.  The heart size is stable.  There is a stable appearance to a dual chamber pacemaker.  IMPRESSION: Possible left lower lung pneumonia.   Original Report Authenticated By: Irish Lack, M.D.     Medications: I have reviewed the patient's current medications. Scheduled Meds:   . [COMPLETED] acetaminophen  650 mg Rectal Once  . allopurinol  100 mg Oral BID  . calcitRIOL  0.25 mcg Oral Daily  . ceFEPime (MAXIPIME) IV  1 g Intravenous Q8H  . clopidogrel  75 mg Oral Q breakfast  . dextromethorphan-guaiFENesin  1 tablet Oral Q12H  . DULoxetine  60 mg Oral Daily  . enoxaparin (LOVENOX) injection  40 mg Subcutaneous Q24H  . feeding supplement  237 mL Oral TID BM  . fesoterodine  4 mg Oral Daily  . guaiFENesin  600 mg Oral BID  . levofloxacin (LEVAQUIN) IV  750 mg Intravenous Q24H  . levothyroxine  25 mcg Oral QAC breakfast  . [COMPLETED] magnesium sulfate  2 g Intravenous Once  . pantoprazole  40 mg Oral BID  . [COMPLETED] piperacillin-tazobactam (ZOSYN)  IV  3.375 g Intravenous Once  . [COMPLETED] potassium chloride  10 mEq Intravenous Q1 Hr x 4  . simvastatin  40 mg Oral QPM  . sotalol  120 mg Oral BID  . [COMPLETED] vancomycin  1,000 mg Intravenous Once  . [DISCONTINUED] acetaminophen  650 mg Oral Once  . [DISCONTINUED] azithromycin  500 mg Intravenous Q24H  . [DISCONTINUED] cefTRIAXone (ROCEPHIN)  IV  1 g Intravenous Q12H   Continuous Infusions:   . sodium chloride Stopped (11/03/12 0104)  . [DISCONTINUED] sodium chloride 150 mL/hr at 11/03/12 0710   PRN Meds:.acetaminophen-codeine, ondansetron (ZOFRAN) IV, ondansetron  Assessment/Plan: Acute respiratory failure due to pneumonia Suspect patient likely has healthcare associated pneumonia as patient was discharged less than 2 months ago.   Change antibiotics from ceftriaxone and azithromycin to vancomycin, cefepime, and levofloxacin.  Antibiotics since 11/03/2012.  Strep pneumo negative.  Urine legionella pending.  Mild hypotension Continue fluids with when necessary fluid boluses as needed.  Do not suspect sepsis at this time as she has normal lactic acid on admission.  Check serum cortisol tomorrow.  Continue IV fluids.  Patient had a 2-D echocardiogram on 09/04/2012 which showed systolic function was normal with ejection fraction of 55-60%.  Mentation at baseline patient.  History of A. Fib Continuous sotalol and Plavix.  Not on anticoagulation because of risk of fall.  Rate controlled.  Anemia Anemia panel on 06/12/2012 suggests iron deficiency anemia with serum iron of 28 with saturation ratios of 9.  Start supplemental iron.  Hypertension Holding lisinopril  due to hypotension.  Sotalol dose decreased from 120 mg to 90 mg twice daily.  Hyperlipidemia Continue statin.  History of Gout Continue allopurinol.  Hypothyroidism Continue levothyroxine.  Depression/anxiety Continue Cymbalta.  History of TIA Continue Plavix.  Generalized weakness with falls at home Will request PT evaluation.  Protein calorie malnutrition Continue Ensure.  Nausea and vomiting prior to admission Likely related to pneumonia.  Continue to monitor.  Prophylaxis Lovenox.  CODE STATUS Full code.  Disposition Continue care in stepdown.   LOS: 1 day  [place] Kestner A, MD 11/03/2012, 9:52 AM

## 2012-11-03 NOTE — Progress Notes (Signed)
Dr. Betti Cruz notified of pt's low BPs(see vitals)--plan of care is to give fluid bolus and increase continuous fluid rate. Renette Butters, Viona Gilmore

## 2012-11-03 NOTE — Progress Notes (Signed)
Spoke with Dr. Betti Cruz about pt's current BPs--plan of care is to monitor and notify MD if SBP<90. Renette Butters, Viona Gilmore

## 2012-11-03 NOTE — Progress Notes (Signed)
Dr. Betti Cruz notified of pt's bladder scan and updated on recent BPs(see vitals). Plan of care is to I&O cath. Renette Butters, Viona Gilmore

## 2012-11-03 NOTE — Progress Notes (Signed)
Notified Dr. Sherian Rein  via text page that pt is hypotensive(see vitals) and is on Sotalol. Dr. Betti Cruz responded @1035 --plan of care is to decrease Sotalol and place parameters on the med. Renette Butters, Viona Gilmore

## 2012-11-04 DIAGNOSIS — I10 Essential (primary) hypertension: Secondary | ICD-10-CM

## 2012-11-04 DIAGNOSIS — I4891 Unspecified atrial fibrillation: Secondary | ICD-10-CM | POA: Diagnosis present

## 2012-11-04 DIAGNOSIS — E785 Hyperlipidemia, unspecified: Secondary | ICD-10-CM

## 2012-11-04 DIAGNOSIS — D649 Anemia, unspecified: Secondary | ICD-10-CM

## 2012-11-04 LAB — URINE CULTURE: Colony Count: 85000

## 2012-11-04 LAB — BASIC METABOLIC PANEL
BUN: 18 mg/dL (ref 6–23)
CO2: 22 mEq/L (ref 19–32)
Chloride: 108 mEq/L (ref 96–112)
Glucose, Bld: 118 mg/dL — ABNORMAL HIGH (ref 70–99)
Potassium: 4.1 mEq/L (ref 3.5–5.1)

## 2012-11-04 LAB — CBC
HCT: 26.5 % — ABNORMAL LOW (ref 36.0–46.0)
Hemoglobin: 8.3 g/dL — ABNORMAL LOW (ref 12.0–15.0)
WBC: 10.3 10*3/uL (ref 4.0–10.5)

## 2012-11-04 MED ORDER — METOPROLOL TARTRATE 1 MG/ML IV SOLN
INTRAVENOUS | Status: AC
  Filled 2012-11-04: qty 5

## 2012-11-04 MED ORDER — METOPROLOL TARTRATE 1 MG/ML IV SOLN
2.5000 mg | Freq: Once | INTRAVENOUS | Status: AC
  Administered 2012-11-04: 2.5 mg via INTRAVENOUS

## 2012-11-04 MED ORDER — BOOST / RESOURCE BREEZE PO LIQD
1.0000 | Freq: Two times a day (BID) | ORAL | Status: DC
  Administered 2012-11-04 – 2012-11-07 (×3): 1 via ORAL

## 2012-11-04 MED ORDER — DIPHENHYDRAMINE HCL 25 MG PO CAPS
25.0000 mg | ORAL_CAPSULE | Freq: Every evening | ORAL | Status: DC | PRN
  Administered 2012-11-06 – 2012-11-07 (×2): 50 mg via ORAL
  Filled 2012-11-04 (×2): qty 2

## 2012-11-04 MED ORDER — SOTALOL HCL 120 MG PO TABS
120.0000 mg | ORAL_TABLET | Freq: Two times a day (BID) | ORAL | Status: DC
  Administered 2012-11-04 – 2012-11-08 (×9): 120 mg via ORAL
  Filled 2012-11-04 (×10): qty 1

## 2012-11-04 MED ORDER — ENSURE COMPLETE PO LIQD
237.0000 mL | Freq: Two times a day (BID) | ORAL | Status: DC
  Administered 2012-11-04 – 2012-11-07 (×4): 237 mL via ORAL

## 2012-11-04 NOTE — Progress Notes (Signed)
Subjective: Still having significant cough with cough productive mucus.  Because of the cough has not been able to fall asleep during the night.  Has been tachycardic today.  Objective: Vital signs in last 24 hours: Filed Vitals:   11/03/12 2320 11/04/12 0500 11/04/12 0700 11/04/12 0800  BP: 94/59 150/75    Pulse: 107 115    Temp: 98.1 F (36.7 C) 98.2 F (36.8 C) 97.5 F (36.4 C) 97.8 F (36.6 C)  TempSrc: Oral Oral Oral Oral  Resp: 19 30    Height:      Weight:      SpO2: 94% 92%     Weight change:   Intake/Output Summary (Last 24 hours) at 11/04/12 0914 Last data filed at 11/04/12 0829  Gross per 24 hour  Intake   3870 ml  Output   1226 ml  Net   2644 ml    Physical Exam: General: Awake, Oriented, No acute distress. HEENT: EOMI. Neck: Supple CV: S1 and S2 Lungs: Clear to ascultation bilaterally, crackles at bases bilaterally. Abdomen: Soft, Nontender, Nondistended, +bowel sounds. Ext: Good pulses. Trace edema.  Lab Results: Basic Metabolic Panel:  Lab 11/04/12 7829 11/03/12 0443 11/02/12 2137 11/02/12 2132  NA 140 139 138 141  K 4.1 4.8 3.2* 3.3*  CL 108 106 102 104  CO2 22 24 25  --  GLUCOSE 118* 127* 104* 104*  BUN 18 15 14 14   CREATININE 1.08 1.10 0.97 1.20*  CALCIUM 8.4 8.3* 9.6 --  MG -- -- -- --  PHOS -- -- -- --   Liver Function Tests:  Lab 11/03/12 0443 11/02/12 2137  AST 22 23  ALT 9 10  ALKPHOS 52 67  BILITOT 0.3 0.4  PROT 5.3* 6.3  ALBUMIN 2.6* 3.4*   No results found for this basename: LIPASE:5,AMYLASE:5 in the last 168 hours No results found for this basename: AMMONIA:5 in the last 168 hours CBC:  Lab 11/04/12 0500 11/03/12 0443 11/02/12 2137 11/02/12 2132  WBC 10.3 11.7* 8.8 --  NEUTROABS -- -- 7.1 --  HGB 8.3* 8.0* 8.8* 10.2*  HCT 26.5* 25.2* 28.2* 30.0*  MCV 93.0 93.0 91.9 --  PLT 168 157 186 --   Cardiac Enzymes: No results found for this basename: CKTOTAL:5,CKMB:5,CKMBINDEX:5,TROPONINI:5 in the last 168 hours BNP (last  3 results) No results found for this basename: PROBNP:3 in the last 8760 hours CBG:  Lab 11/02/12 2136  GLUCAP 104*   No results found for this basename: HGBA1C:5 in the last 72 hours Other Labs: No components found with this basename: POCBNP:3 No results found for this basename: DDIMER:2 in the last 168 hours No results found for this basename: CHOL:2,HDL:2,LDLCALC:2,TRIG:2,CHOLHDL:2,LDLDIRECT:2 in the last 168 hours No results found for this basename: TSH,T4TOTAL,FREET3,T3FREE,FREET4,THYROIDAB in the last 168 hours No results found for this basename: VITAMINB12:2,FOLATE:2,FERRITIN:2,TIBC:2,IRON:2,RETICCTPCT:2 in the last 168 hours  Micro Results: Recent Results (from the past 240 hour(s))  URINE CULTURE     Status: Normal   Collection Time   11/02/12  9:52 PM      Component Value Range Status Comment   Specimen Description URINE, CLEAN CATCH   Final    Special Requests NONE   Final    Culture  Setup Time 11/03/2012 04:26   Final    Colony Count 85,000 COLONIES/ML   Final    Culture     Final    Value: Multiple bacterial morphotypes present, none predominant. Suggest appropriate recollection if clinically indicated.   Report Status 11/04/2012 FINAL  Final   CULTURE, BLOOD (ROUTINE X 2)     Status: Normal (Preliminary result)   Collection Time   11/02/12  9:55 PM      Component Value Range Status Comment   Specimen Description BLOOD HAND LEFT   Final    Special Requests BOTTLES DRAWN AEROBIC AND ANAEROBIC 10CC EA   Final    Culture  Setup Time 11/03/2012 03:58   Final    Culture     Final    Value:        BLOOD CULTURE RECEIVED NO GROWTH TO DATE CULTURE WILL BE HELD FOR 5 DAYS BEFORE ISSUING [place] FINAL NEGATIVE REPORT   Report Status PENDING   Incomplete   CULTURE, BLOOD (ROUTINE X 2)     Status: Normal (Preliminary result)   Collection Time   11/02/12 10:13 PM      Component Value Range Status Comment   Specimen Description BLOOD ARM RIGHT   Final    Special Requests BOTTLES  DRAWN AEROBIC ONLY   Final    Culture  Setup Time 11/03/2012 03:58   Final    Culture     Final    Value:        BLOOD CULTURE RECEIVED NO GROWTH TO DATE CULTURE WILL BE HELD FOR 5 DAYS BEFORE ISSUING [place] FINAL NEGATIVE REPORT   Report Status PENDING   Incomplete   MRSA PCR SCREENING     Status: Normal   Collection Time   11/03/12 12:54 AM      Component Value Range Status Comment   MRSA by PCR NEGATIVE  NEGATIVE Final   CULTURE, EXPECTORATED SPUTUM-ASSESSMENT     Status: Normal   Collection Time   11/03/12  2:48 AM      Component Value Range Status Comment   Specimen Description SPU   Final    Special Requests Normal   Final    Sputum evaluation     Final    Value: THIS SPECIMEN IS ACCEPTABLE. RESPIRATORY CULTURE REPORT TO FOLLOW.   Report Status 11/03/2012 FINAL   Final   CULTURE, RESPIRATORY     Status: Normal (Preliminary result)   Collection Time   11/03/12  2:48 AM      Component Value Range Status Comment   Specimen Description SPUTUM   Final    Special Requests ADDED 0336   Final    Gram Stain     Final    Value: ABUNDANT WBC PRESENT, PREDOMINANTLY PMN     FEW SQUAMOUS EPITHELIAL CELLS PRESENT     MODERATE GRAM POSITIVE RODS     FEW GRAM POSITIVE COCCI IN PAIRS     FEW GRAM NEGATIVE COCCI   Culture PENDING   Incomplete    Report Status PENDING   Incomplete     Studies/Results: Dg Pelvis 1-2 Views  11/03/2012  *RADIOLOGY REPORT*  Clinical Data: Pelvic pain secondary to Whitehead fall.  PELVIS - 1-2 VIEW  Comparison: CT scan of the abdomen and pelvis dated 06/12/2012  Findings: Osseous structures of the pelvis appear normal.  Severe degenerative disc disease and scoliosis of the lumbar spine.  IMPRESSION: No acute abnormality of the osseous structures of the pelvis.   Original Report Authenticated By: Francene Boyers, M.D.    Dg Shoulder Right  11/03/2012  *RADIOLOGY REPORT*  Clinical Data: Fall.  Right shoulder injury and pain.  RIGHT SHOULDER - 2+ VIEW  Comparison:  None.   Findings:  There is no evidence of fracture or  dislocation.  There is no evidence of arthropathy or other focal bone abnormality. Soft tissues are unremarkable. Transvenous pacemaker is noted in the right chest wall.  IMPRESSION: Negative right shoulder radiographs.   Original Report Authenticated By: Myles Rosenthal, M.D.    Ct Head Wo Contrast  11/02/2012  *RADIOLOGY REPORT*  Clinical Data: Confusion and inability to walk.  CT HEAD WITHOUT CONTRAST  Technique:  Contiguous axial images were obtained from the base of the skull through the vertex without contrast.  Comparison: 09/05/2012  Findings: Stable atrophy and small vessel disease. The brain demonstrates no evidence of hemorrhage, infarction, edema, mass effect, extra-axial fluid collection, hydrocephalus or mass lesion. The skull is unremarkable.  IMPRESSION: Stable atrophy and small vessel disease.  No acute findings.   Original Report Authenticated By: Irish Lack, M.D.    Dg Chest Port 1 View  11/02/2012  *RADIOLOGY REPORT*  Clinical Data: Cough, congestion and shortness of breath.  PORTABLE CHEST - 1 VIEW  Comparison: 09/04/2012  Findings: Lung volumes are extremely low.  There is chronic elevation of the right hemidiaphragm.  No overt edema identified. There is increased density at the left lung base that partially obscures the left hemidiaphragm.  This may be consistent with acute pneumonia.  The heart size is stable.  There is Whitehead stable appearance to Whitehead dual chamber pacemaker.  IMPRESSION: Possible left lower lung pneumonia.   Original Report Authenticated By: Irish Lack, M.D.     Medications: I have reviewed the patient's current medications. Scheduled Meds:    . allopurinol  100 mg Oral BID  . calcitRIOL  0.25 mcg Oral Daily  . ceFEPime (MAXIPIME) IV  2 g Intravenous Q24H  . clopidogrel  75 mg Oral Q breakfast  . DULoxetine  60 mg Oral Daily  . enoxaparin (LOVENOX) injection  40 mg Subcutaneous Q24H  . feeding supplement  237 mL  Oral TID BM  . fesoterodine  4 mg Oral Daily  . guaiFENesin  600 mg Oral BID  . iron polysaccharides  150 mg Oral BID  . levofloxacin (LEVAQUIN) IV  750 mg Intravenous Q48H  . levothyroxine  25 mcg Oral QAC breakfast  . metoprolol      . metoprolol  2.5 mg Intravenous Once  . pantoprazole  40 mg Oral BID  . simvastatin  40 mg Oral QPM  . [COMPLETED] sodium chloride  250 mL Intravenous Once  . sotalol  120 mg Oral BID  . vancomycin  750 mg Intravenous Q12H  . [COMPLETED] white petrolatum      . [DISCONTINUED] azithromycin  500 mg Intravenous Q24H  . [DISCONTINUED] ceFEPime (MAXIPIME) IV  1 g Intravenous Q8H  . [DISCONTINUED] cefTRIAXone (ROCEPHIN)  IV  1 g Intravenous Q12H  . [DISCONTINUED] dextromethorphan-guaiFENesin  1 tablet Oral Q12H  . [DISCONTINUED] sotalol  120 mg Oral BID  . [DISCONTINUED] sotalol  80 mg Oral BID  . [DISCONTINUED] sotalol  80 mg Oral Daily   Continuous Infusions:    . [DISCONTINUED] sodium chloride 125 mL/hr at 11/04/12 0813  . [DISCONTINUED] sodium chloride 150 mL/hr at 11/03/12 0710   PRN Meds:.acetaminophen, ibuprofen, ondansetron (ZOFRAN) IV, ondansetron, [DISCONTINUED] acetaminophen-codeine  Assessment/Plan: Acute respiratory failure due to pneumonia Likely has healthcare associated pneumonia as patient was hospitalized and discharged less than 2 months ago.  Continue vancomycin, cefepime, and levofloxacin.  Antibiotics since 11/03/2012.  Strep pneumo negative.  Urine legionella pending.  Tachycardia Likely because patient's sotalol was held due to hypotension yesterday.  Patient given one  dose of IV metoprolol 2.5 mg and restarted on sotalol with hold parameters.  Tachycardia improved.  Mild hypotension Resolved.  Likely had Whitehead component of dehydration from poor by mouth intake prior to admission.  Improved with IV fluids, discontinue IV fluids.  Do not suspect sepsis at this time as she has normal lactic acid on admission.  Check serum cortisol  tomorrow.  Patient had Whitehead 2-D echocardiogram on 09/04/2012 which showed systolic function was normal with ejection fraction of 55-60%.  Mentation at baseline patient.  History of Whitehead. Fib Continue sotalol and Plavix.  Not on anticoagulation because of risk of fall.  Rate controlled.  Anemia Anemia panel on 06/12/2012 suggests iron deficiency anemia with serum iron of 28 with saturation ratios of 9.  On Niferex 150 mg po BID.   Hypertension Continue to hold lisinopril, restart tomorrow if blood pressure improved.  Continue sotalol.  Hyperlipidemia Continue statin.  History of Gout Continue allopurinol.  Hypothyroidism Continue levothyroxine.  Depression/anxiety Continue Cymbalta.  History of TIA Continue Plavix.  Generalized weakness with falls at home PT evaluation pending.  Protein calorie malnutrition Continue Ensure.  Appreciate nutrition consultation.  Nausea and vomiting prior to admission Likely related to pneumonia and excessive cough.  Continue to monitor.  Prophylaxis Lovenox.  CODE STATUS Full code.  Disposition Continue care in stepdown.  Family communication Discussed with Mr. Mena Pauls [phone] and updated him at patient's request.   LOS: 2 days  Bridget Humphres A, MD 11/04/2012, 9:14 AM

## 2012-11-04 NOTE — Progress Notes (Addendum)
INITIAL ADULT NUTRITION ASSESSMENT Date: 11/04/2012   Time: 9:29 AM  Reason for Assessment: MD Consult for assessment of nutrition needs  INTERVENTION:  Resource Breeze PO BID to maximize oral intake.  Change Ensure Complete to PO BID with meals.  DOCUMENTATION CODES Per approved criteria -Not Applicable  ASSESSMENT: Female 78 y.o.  Dx: PNA (pneumonia)  Hx:  Past Medical History  Diagnosis Date  . Hypertension   . Hyperlipidemia   . GERD (gastroesophageal reflux disease)   . Dysphagia     appt with Molly Maduro Buccini upcoming  . Chronic recurrent sinusitis   . Recurrent UTI   . Atrial fibrillation     ???  . Arthritis   . TIA (transient ischemic attack)     Past Surgical History  Procedure Date  . Pacemaker insertion   . Back surgery 1985  . Shoulder surgery 2008    Left  (Dr. Ranell Patrick)  . Total knee arthroplasty 2000, 2001    bilateral (Dr. Despina Hick), left 2000, right 2001  . Cataract extraction 1986, 2003    left 1986, right 2003  . Vaginal hysterectomy 1969  . Bilateral salpingoophorectomy 1971  . Tonsillectomy 1951  . Appendectomy 1952  . Mastoidectomy 1970  . Sinus exploration 1972  . Knee arthroscopy 1988, 1995, 2000  . Hemorrhoid surgery 2009  . Repair / reconstruction interphalangeal joint 2004    R thumb  . Cystoscopy 1992  . Insert / replace / remove pacemaker     Related Meds:  Scheduled Meds:   . allopurinol  100 mg Oral BID  . calcitRIOL  0.25 mcg Oral Daily  . ceFEPime (MAXIPIME) IV  2 g Intravenous Q24H  . clopidogrel  75 mg Oral Q breakfast  . DULoxetine  60 mg Oral Daily  . enoxaparin (LOVENOX) injection  40 mg Subcutaneous Q24H  . feeding supplement  237 mL Oral TID BM  . fesoterodine  4 mg Oral Daily  . guaiFENesin  600 mg Oral BID  . iron polysaccharides  150 mg Oral BID  . levofloxacin (LEVAQUIN) IV  750 mg Intravenous Q48H  . levothyroxine  25 mcg Oral QAC breakfast  . metoprolol      . metoprolol  2.5 mg Intravenous Once  .  pantoprazole  40 mg Oral BID  . simvastatin  40 mg Oral QPM  . [COMPLETED] sodium chloride  250 mL Intravenous Once  . sotalol  120 mg Oral BID  . vancomycin  750 mg Intravenous Q12H  . [COMPLETED] white petrolatum      . [DISCONTINUED] azithromycin  500 mg Intravenous Q24H  . [DISCONTINUED] ceFEPime (MAXIPIME) IV  1 g Intravenous Q8H  . [DISCONTINUED] cefTRIAXone (ROCEPHIN)  IV  1 g Intravenous Q12H  . [DISCONTINUED] dextromethorphan-guaiFENesin  1 tablet Oral Q12H  . [DISCONTINUED] sotalol  120 mg Oral BID  . [DISCONTINUED] sotalol  80 mg Oral BID  . [DISCONTINUED] sotalol  80 mg Oral Daily   Continuous Infusions:   . [DISCONTINUED] sodium chloride 125 mL/hr at 11/04/12 0813  . [DISCONTINUED] sodium chloride 150 mL/hr at 11/03/12 0710   PRN Meds:.acetaminophen, ibuprofen, ondansetron (ZOFRAN) IV, ondansetron, [DISCONTINUED] acetaminophen-codeine   Ht: 5\' 5"  (165.1 cm)  Wt: 159 lb 2.8 oz (72.2 kg)  Ideal Wt: 56.8 kg % Ideal Wt: 127%  Usual Wt:  Wt Readings from Last 10 Encounters:  11/03/12 159 lb 2.8 oz (72.2 kg)  09/03/12 159 lb 13.3 oz (72.5 kg)  06/12/12 157 lb (71.215 kg)  % Usual Wt: 101%  Body mass index is 26.49 kg/(m^2).  Food/Nutrition Related Hx: recent poor appetite related to coughing; eats regular consistency foods at home.  Labs:  CMP     Component Value Date/Time   NA 140 11/04/2012 0500   K 4.1 11/04/2012 0500   CL 108 11/04/2012 0500   CO2 22 11/04/2012 0500   GLUCOSE 118* 11/04/2012 0500   BUN 18 11/04/2012 0500   CREATININE 1.08 11/04/2012 0500   CALCIUM 8.4 11/04/2012 0500   PROT 5.3* 11/03/2012 0443   ALBUMIN 2.6* 11/03/2012 0443   AST 22 11/03/2012 0443   ALT 9 11/03/2012 0443   ALKPHOS 52 11/03/2012 0443   BILITOT 0.3 11/03/2012 0443   GFRNONAA 48* 11/04/2012 0500   GFRAA 55* 11/04/2012 0500    CBG (last 3)   Basename 11/02/12 2136  GLUCAP 104*      Intake/Output Summary (Last 24 hours) at 11/04/12 0933 Last data filed at 11/04/12 1610   Gross per 24 hour  Intake   3870 ml  Output   1226 ml  Net   2644 ml    Diet Order: Regular diet with Ensure Complete PO TID   IVF:    [DISCONTINUED] sodium chloride Last Rate: 125 mL/hr at 11/04/12 9604  [DISCONTINUED] sodium chloride Last Rate: 150 mL/hr at 11/03/12 0710    Estimated Nutritional Needs:   Kcal: 1500-1650 Protein: 75-90 gm Fluid: 1.5-1.7 Liters  Patient reports recent poor appetite related to coughing and phlegm.  Has not lost any weight.  Has had an esophageal stretching procedure in the past by Dr. Matthias Hughs, has not had any follow-up because of three recent hospitalizations.  Has not had any difficulty swallowing since the procedure.  Drank 1 Ensure yesterday, but was unable to drink a second one due to increased coughing.  NUTRITION DIAGNOSIS: Inadequate oral intake related to coughing as evidenced by poor intake of meals.   MONITORING/EVALUATION(Goals): Goal:  Intake to meet >90% of estimated nutrition needs. Monitor:  PO intake, weight trend.  EDUCATION NEEDS: -Education not appropriate at this time   Joaquin Courts, RD, LDN, CNSC Pager# [phone] After Hours Pager# [phone]  11/04/2012, 9:29 AM

## 2012-11-04 NOTE — Progress Notes (Signed)
Utilization review completed.  

## 2012-11-04 NOTE — Progress Notes (Signed)
PT Cancellation Note  Patient Details Name: REGNIA SAVASTANO MRN: [id number] DOB: [birth year]   Cancelled Treatment:    Reason Eval/Treat Not Completed: Patient not medically ready (Increase HR ).  Attempted to see pt x 2.  RN requested to hold due to increase HR at rest.   Bronsen Serano 11/04/2012, 3:51 PM Jake Shark, PT DPT [phone]

## 2012-11-05 DIAGNOSIS — I4891 Unspecified atrial fibrillation: Secondary | ICD-10-CM

## 2012-11-05 LAB — CBC
Hemoglobin: 8.1 g/dL — ABNORMAL LOW (ref 12.0–15.0)
MCH: 29.6 pg (ref 26.0–34.0)
MCV: 91.2 fL (ref 78.0–100.0)
RBC: 2.74 MIL/uL — ABNORMAL LOW (ref 3.87–5.11)

## 2012-11-05 LAB — LEGIONELLA ANTIGEN, URINE

## 2012-11-05 LAB — BASIC METABOLIC PANEL
CO2: 23 mEq/L (ref 19–32)
Calcium: 8.8 mg/dL (ref 8.4–10.5)
Creatinine, Ser: 0.82 mg/dL (ref 0.50–1.10)
Glucose, Bld: 115 mg/dL — ABNORMAL HIGH (ref 70–99)

## 2012-11-05 MED ORDER — LISINOPRIL 5 MG PO TABS
5.0000 mg | ORAL_TABLET | Freq: Every day | ORAL | Status: DC
  Administered 2012-11-05 – 2012-11-08 (×4): 5 mg via ORAL
  Filled 2012-11-05 (×4): qty 1

## 2012-11-05 MED ORDER — LEVALBUTEROL HCL 1.25 MG/0.5ML IN NEBU
1.2500 mg | INHALATION_SOLUTION | Freq: Once | RESPIRATORY_TRACT | Status: AC
  Administered 2012-11-05: 1.25 mg via RESPIRATORY_TRACT
  Filled 2012-11-05: qty 0.5

## 2012-11-05 MED ORDER — POTASSIUM CHLORIDE CRYS ER 20 MEQ PO TBCR
40.0000 meq | EXTENDED_RELEASE_TABLET | Freq: Once | ORAL | Status: AC
  Administered 2012-11-05: 40 meq via ORAL
  Filled 2012-11-05: qty 2

## 2012-11-05 MED ORDER — LEVOFLOXACIN IN D5W 750 MG/150ML IV SOLN
750.0000 mg | INTRAVENOUS | Status: AC
  Administered 2012-11-05 – 2012-11-06 (×2): 750 mg via INTRAVENOUS
  Filled 2012-11-05 (×2): qty 150

## 2012-11-05 NOTE — Progress Notes (Addendum)
Subjective: Feeling better. Cough improving. Slept better during the night.  Objective: Vital signs in last 24 hours: Filed Vitals:   11/04/12 2300 11/05/12 0324 11/05/12 0535 11/05/12 0743  BP: 135/95  159/80 173/93  Pulse: 84 117 99   Temp: 98.3 F (36.8 C)  98.4 F (36.9 C) 98.2 F (36.8 C)  TempSrc: Oral  Oral Axillary  Resp: 27 30 29    Height:      Weight:      SpO2: 93% 99% 90%    Weight change:   Intake/Output Summary (Last 24 hours) at 11/05/12 0945 Last data filed at 11/05/12 0100  Gross per 24 hour  Intake    870 ml  Output    976 ml  Net   -106 ml    Physical Exam: General: Awake, Oriented, No acute distress. HEENT: EOMI. Neck: Supple CV: S1 and S2 Lungs: Clear to ascultation bilaterally, crackles at bases bilaterally. Abdomen: Soft, Nontender, Nondistended, +bowel sounds. Ext: Good pulses. Trace edema.  Lab Results: Basic Metabolic Panel:  Lab 11/05/12 1610 11/04/12 0500 11/03/12 0443 11/02/12 2137 11/02/12 2132  NA 139 140 139 138 141  K 2.9* 4.1 4.8 3.2* 3.3*  CL 104 108 106 102 104  CO2 23 22 24 25  --  GLUCOSE 115* 118* 127* 104* 104*  BUN 12 18 15 14 14   CREATININE 0.82 1.08 1.10 0.97 1.20*  CALCIUM 8.8 8.4 8.3* 9.6 --  MG -- -- -- -- --  PHOS -- -- -- -- --   Liver Function Tests:  Lab 11/03/12 0443 11/02/12 2137  AST 22 23  ALT 9 10  ALKPHOS 52 67  BILITOT 0.3 0.4  PROT 5.3* 6.3  ALBUMIN 2.6* 3.4*   No results found for this basename: LIPASE:5,AMYLASE:5 in the last 168 hours No results found for this basename: AMMONIA:5 in the last 168 hours CBC:  Lab 11/05/12 0535 11/04/12 0500 11/03/12 0443 11/02/12 2137 11/02/12 2132  WBC 8.5 10.3 11.7* 8.8 --  NEUTROABS -- -- -- 7.1 --  HGB 8.1* 8.3* 8.0* 8.8* 10.2*  HCT 25.0* 26.5* 25.2* 28.2* 30.0*  MCV 91.2 93.0 93.0 91.9 --  PLT 190 168 157 186 --   Cardiac Enzymes: No results found for this basename: CKTOTAL:5,CKMB:5,CKMBINDEX:5,TROPONINI:5 in the last 168 hours BNP (last 3  results) No results found for this basename: PROBNP:3 in the last 8760 hours CBG:  Lab 11/02/12 2136  GLUCAP 104*   No results found for this basename: HGBA1C:5 in the last 72 hours Other Labs: No components found with this basename: POCBNP:3 No results found for this basename: DDIMER:2 in the last 168 hours No results found for this basename: CHOL:2,HDL:2,LDLCALC:2,TRIG:2,CHOLHDL:2,LDLDIRECT:2 in the last 168 hours No results found for this basename: TSH,T4TOTAL,FREET3,T3FREE,FREET4,THYROIDAB in the last 168 hours No results found for this basename: VITAMINB12:2,FOLATE:2,FERRITIN:2,TIBC:2,IRON:2,RETICCTPCT:2 in the last 168 hours  Micro Results: Recent Results (from the past 240 hour(s))  URINE CULTURE     Status: Normal   Collection Time   11/02/12  9:52 PM      Component Value Range Status Comment   Specimen Description URINE, CLEAN CATCH   Final    Special Requests NONE   Final    Culture  Setup Time 11/03/2012 04:26   Final    Colony Count 85,000 COLONIES/ML   Final    Culture     Final    Value: Multiple bacterial morphotypes present, none predominant. Suggest appropriate recollection if clinically indicated.   Report Status 11/04/2012 FINAL  Final   CULTURE, BLOOD (ROUTINE X 2)     Status: Normal (Preliminary result)   Collection Time   11/02/12  9:55 PM      Component Value Range Status Comment   Specimen Description BLOOD HAND LEFT   Final    Special Requests BOTTLES DRAWN AEROBIC AND ANAEROBIC 10CC EA   Final    Culture  Setup Time 11/03/2012 03:58   Final    Culture     Final    Value:        BLOOD CULTURE RECEIVED NO GROWTH TO DATE CULTURE WILL BE HELD FOR 5 DAYS BEFORE ISSUING A FINAL NEGATIVE REPORT   Report Status PENDING   Incomplete   CULTURE, BLOOD (ROUTINE X 2)     Status: Normal (Preliminary result)   Collection Time   11/02/12 10:13 PM      Component Value Range Status Comment   Specimen Description BLOOD ARM RIGHT   Final    Special Requests BOTTLES  DRAWN AEROBIC ONLY   Final    Culture  Setup Time 11/03/2012 03:58   Final    Culture     Final    Value:        BLOOD CULTURE RECEIVED NO GROWTH TO DATE CULTURE WILL BE HELD FOR 5 DAYS BEFORE ISSUING A FINAL NEGATIVE REPORT   Report Status PENDING   Incomplete   MRSA PCR SCREENING     Status: Normal   Collection Time   11/03/12 12:54 AM      Component Value Range Status Comment   MRSA by PCR NEGATIVE  NEGATIVE Final   CULTURE, EXPECTORATED SPUTUM-ASSESSMENT     Status: Normal   Collection Time   11/03/12  2:48 AM      Component Value Range Status Comment   Specimen Description SPU   Final    Special Requests Normal   Final    Sputum evaluation     Final    Value: THIS SPECIMEN IS ACCEPTABLE. RESPIRATORY CULTURE REPORT TO FOLLOW.   Report Status 11/03/2012 FINAL   Final   CULTURE, RESPIRATORY     Status: Normal (Preliminary result)   Collection Time   11/03/12  2:48 AM      Component Value Range Status Comment   Specimen Description SPUTUM   Final    Special Requests ADDED 0336   Final    Gram Stain     Final    Value: ABUNDANT WBC PRESENT, PREDOMINANTLY PMN     FEW SQUAMOUS EPITHELIAL CELLS PRESENT     MODERATE GRAM POSITIVE RODS     FEW GRAM POSITIVE COCCI IN PAIRS     FEW GRAM NEGATIVE COCCI   Culture     Final    Value: MODERATE STAPHYLOCOCCUS AUREUS     Note: RIFAMPIN AND GENTAMICIN SHOULD NOT BE USED AS SINGLE DRUGS FOR TREATMENT OF STAPH INFECTIONS.     MODERATE MORAXELLA CATARRHALIS(BRANHAMELLA)     Note: BETA LACTAMASE POSITIVE   Report Status PENDING   Incomplete     Studies/Results: Dg Pelvis 1-2 Views  11/03/2012  *RADIOLOGY REPORT*  Clinical Data: Pelvic pain secondary to a fall.  PELVIS - 1-2 VIEW  Comparison: CT scan of the abdomen and pelvis dated 06/12/2012  Findings: Osseous structures of the pelvis appear normal.  Severe degenerative disc disease and scoliosis of the lumbar spine.  IMPRESSION: No acute abnormality of the osseous structures of the pelvis.    Original Report Authenticated By: Francene Boyers,  M.D.    Dg Shoulder Right  11/03/2012  *RADIOLOGY REPORT*  Clinical Data: Fall.  Right shoulder injury and pain.  RIGHT SHOULDER - 2+ VIEW  Comparison:  None.  Findings:  There is no evidence of fracture or dislocation.  There is no evidence of arthropathy or other focal bone abnormality. Soft tissues are unremarkable. Transvenous pacemaker is noted in the right chest wall.  IMPRESSION: Negative right shoulder radiographs.   Original Report Authenticated By: Myles Rosenthal, M.D.     Medications: I have reviewed the patient's current medications. Scheduled Meds:    . allopurinol  100 mg Oral BID  . calcitRIOL  0.25 mcg Oral Daily  . ceFEPime (MAXIPIME) IV  2 g Intravenous Q24H  . clopidogrel  75 mg Oral Q breakfast  . DULoxetine  60 mg Oral Daily  . enoxaparin (LOVENOX) injection  40 mg Subcutaneous Q24H  . feeding supplement  237 mL Oral BID WC  . feeding supplement  1 Container Oral BID BM  . fesoterodine  4 mg Oral Daily  . guaiFENesin  600 mg Oral BID  . iron polysaccharides  150 mg Oral BID  . [COMPLETED] levalbuterol  1.25 mg Nebulization Once  . levofloxacin (LEVAQUIN) IV  750 mg Intravenous Q48H  . levothyroxine  25 mcg Oral QAC breakfast  . [COMPLETED] metoprolol      . [COMPLETED] metoprolol  2.5 mg Intravenous Once  . pantoprazole  40 mg Oral BID  . potassium chloride  40 mEq Oral Once  . simvastatin  40 mg Oral QPM  . sotalol  120 mg Oral BID  . vancomycin  750 mg Intravenous Q12H  . [DISCONTINUED] feeding supplement  237 mL Oral TID BM   Continuous Infusions:   PRN Meds:.acetaminophen, diphenhydrAMINE, ibuprofen, ondansetron (ZOFRAN) IV, ondansetron  Assessment/Plan: Acute respiratory failure due to healthcare associated pneumonia Healthcare associated pneumonia as patient was hospitalized and discharged less than 2 months ago.  Continue vancomycin, cefepime, and levofloxacin.  Antibiotics since 11/03/2012.  Strep pneumo  negative.  Urine legionella pending.  Tachycardia Improved. Likely because patient's sotalol was held due to hypotension on 11/03/2012.  Improved with restarting sotalol.  Mild hypotension Resolved.  Likely had a component of dehydration from poor by mouth intake prior to admission.  Improved with IV fluids, discontinue IV fluids.  Do not suspect sepsis at this time as she has normal lactic acid on admission.  Check serum cortisol tomorrow.  Patient had a 2-D echocardiogram on 09/04/2012 which showed systolic function was normal with ejection fraction of 55-60%.  Mentation at baseline patient.  History of A. Fib Continue sotalol and Plavix.  Not on anticoagulation because of risk of fall.  Rate controlled.  Anemia Anemia panel on 06/12/2012 suggests iron deficiency anemia with serum iron of 28 with saturation ratios of 9.  On Niferex 150 mg po BID.   Hypertension Improved. Restart lisinopril 5 mg daily, titrate up as tolerated.  Continue sotalol.  Hyperlipidemia Continue statin.  History of Gout Continue allopurinol.  Hypothyroidism Continue levothyroxine.  Depression/anxiety Continue Cymbalta.  History of TIA Continue Plavix.  Generalized weakness with falls at home PT evaluation pending. Patient interested in SNF, will request social worker to discuss with patient.  Protein calorie malnutrition Continue Ensure.  Appreciate nutrition consultation.  Nausea and vomiting prior to admission Likely related to pneumonia and excessive cough.  Continue to monitor.  Urinary incontinence Has chronic urinary incontinence.  Place Foley catheter to prevent further skin breakdown.  Discontinue if patient's  strength improves during the hospital stay.  Prophylaxis Lovenox.  CODE STATUS Full code.  Disposition Transfer to telemetry.  Family communication Discussed with Mr. Mena Pauls [phone] and updated him at patient's request on 11/04/2012.   LOS: 3 days  Neha Waight A,  MD 11/05/2012, 9:45 AM

## 2012-11-05 NOTE — Evaluation (Signed)
Occupational Therapy Evaluation Patient Details Name: [name] MRN: [id number] DOB: [birth year] Today's Date: 11/05/2012 Time: 6578-4696 OT Time Calculation (min): 38 min  OT Assessment / Plan / Recommendation Clinical Impression  This 78 yo female admitted with generalized malaise and weakness and found to have PNA presents to acute OT with problems below. Will benefit from acute OT with follow up OT at CIR.    OT Assessment  Patient needs continued OT Services    Follow Up Recommendations  CIR    Barriers to Discharge Decreased caregiver support    Equipment Recommendations  None recommended by OT       Frequency  Min 2X/week    Precautions / Restrictions Precautions Precautions: Fall Restrictions Weight Bearing Restrictions: No       ADL  Eating/Feeding: Simulated;Set up Where Assessed - Eating/Feeding: Chair Grooming: Simulated;Set up Where Assessed - Grooming: Supported sitting Upper Body Bathing: Simulated;Set up;Supervision/safety Where Assessed - Upper Body Bathing: Supported sitting Lower Body Bathing: Simulated;Maximal assistance Where Assessed - Lower Body Bathing: Supported sit to stand Upper Body Dressing: Simulated;Moderate assistance Where Assessed - Upper Body Dressing: Supported sitting Lower Body Dressing: Simulated;+1 Total assistance Where Assessed - Lower Body Dressing: Supported sit to Pharmacist, hospital: Simulated;Moderate assistance Toilet Transfer Method: Sit to Barista:  (From bed out door sit in recliner) Toileting - Clothing Manipulation and Hygiene: Simulated;Maximal assistance Where Assessed - Toileting Clothing Manipulation and Hygiene: Standing Transfers/Ambulation Related to ADLs: Mod A sit to stand and stand to sit as well as ambulation with additional person needed for lines/tubes.    OT Diagnosis: Generalized weakness  OT Problem List: Decreased strength;Decreased activity tolerance;Impaired  balance (sitting and/or standing);Decreased cognition;Decreased safety awareness;Decreased knowledge of use of DME or AE;Cardiopulmonary status limiting activity OT Treatment Interventions: Self-care/ADL training;Energy conservation;Therapeutic activities;Cognitive remediation/compensation;DME and/or AE instruction;Patient/family education;Balance training   OT Goals Acute Rehab OT Goals OT Goal Formulation: With patient Time For Goal Achievement: 11/19/12 Potential to Achieve Goals: Good ADL Goals Pt Will Perform Grooming: with set-up;with supervision;Unsupported;Sitting, edge of bed ADL Goal: Grooming - Progress: Goal set today Pt Will Transfer to Toilet: with min assist;Ambulation;with DME;3-in-1 (over toilet) ADL Goal: Toilet Transfer - Progress: Goal set today Pt Will Perform Toileting - Clothing Manipulation: with supervision;Standing ADL Goal: Toileting - Clothing Manipulation - Progress: Goal set today Pt Will Perform Toileting - Hygiene: Independently;Sitting on 3-in-1 or toilet ADL Goal: Toileting - Hygiene - Progress: Goal set today Miscellaneous OT Goals Miscellaneous OT Goal #1: Pt will be able to come up to sit from supine (HOB flat and not rail ) with supervision in prep for transfers and to A with BADLs. OT Goal: Miscellaneous Goal #1 - Progress: Goal set today Miscellaneous OT Goal #2: Pt will show safety with transfers without needing A/cues for balance and hand placement. OT Goal: Miscellaneous Goal #2 - Progress: Goal set today  Visit Information  Last OT Received On: 11/05/12 Assistance Needed: +2 (helpful) PT/OT Co-Evaluation/Treatment: Yes    Subjective Data  Subjective: My son does some work from home Patient Stated Goal: To home and not to rehab anywhere   Prior Functioning     Home Living Lives With: Son Available Help at Discharge: Family;Available PRN/intermittently (son works, not on a regular schedule) Type of Home: House Home Access: Stairs to  enter [employer] of Steps: 2-3 Entrance Stairs-Rails: None Home Layout: Two level;Full bath on main level Alternate Level Stairs-Number of Steps: flight to her bedroom Alternate Level Stairs-Rails: Right;Left  Bathroom Shower/Tub: Walk-in Contractor: Standard Home Adaptive Equipment: Clinical research associate - four wheeled;Bedside commode/3-in-1;Shower chair with back;Wheelchair - manual Additional Comments: hurricane? Prior Function Able to Take Stairs?: Yes Driving: Yes Vocation: Retired Comments: had an aide come 2x/wk for a few weeks after her last admission but pt couldn't afford it Communication Communication: No difficulties         Vision/Perception  Wears glasses for driving   Cognition  Overall Cognitive Status: Impaired Area of Impairment: Memory;Problem solving Arousal/Alertness: Lethargic Orientation Level: Disoriented to;Time Behavior During Session: WFL for tasks performed Cognition - Other Comments: slow processing    Extremity/Trunk Assessment Right Upper Extremity Assessment RUE ROM/Strength/Tone: Within functional levels Left Upper Extremity Assessment LUE ROM/Strength/Tone: Within functional levels Right Lower Extremity Assessment RLE ROM/Strength/Tone: Deficits RLE ROM/Strength/Tone Deficits: generally weak, grossly 3-4/5 Left Lower Extremity Assessment LLE ROM/Strength/Tone: Deficits LLE ROM/Strength/Tone Deficits: generally weak, grossly 3-4/5 Trunk Assessment Trunk Assessment: Kyphotic     Mobility Bed Mobility Bed Mobility: Supine to Sit Supine to Sit: 3: Mod assist Details for Bed Mobility Assistance: mod sequencing cues and facilitation for follow through due to weakness Transfers Sit to Stand: 3: Mod assist;With upper extremity assist;From bed Stand to Sit: 3: Mod assist;With upper extremity assist;To chair/3-in-1;With armrests Details for Transfer Assistance: cues for hand placement, facilitation for  anterior translation of trunk over BOS and power up; assist for stability initially as pt with posterior lean           Balance Balance Balance Assessed: Yes Static Standing Balance Static Standing - Balance Support: Bilateral upper extremity supported Static Standing - Level of Assistance: 4: Min assist;3: Mod assist (assist to prevent posterior lean)   End of Session OT - End of Session Equipment Utilized During Treatment: Gait belt (RW) Activity Tolerance: Patient limited by fatigue (decrease in O2 sats) Patient left: in chair;with call bell/phone within reach       Evette Georges [phone] 11/05/2012, 3:07 PM

## 2012-11-05 NOTE — Progress Notes (Signed)
Patient being transferred to 2037 via wheehchair. Son in to visit and made aware of the transfer and new room number. Have answered the son's questions and concerns. Phone report called to [facility]. She is aware that the IV's in the patient's arm are not working and I will notify the IV team.

## 2012-11-05 NOTE — Evaluation (Signed)
Physical Therapy Evaluation Patient Details Name: [name] MRN: [id number] DOB: August 14, 1931 Today's Date: 11/05/2012 Time: 4098-1191 PT Time Calculation (min): 38 min  PT Assessment / Plan / Recommendation Clinical Impression  [name] is 78 y/o female admitted for general malaise and difficulty walking. Is now being treated for CAP. Presents to PT today with generalized weakness and below problem list limiting pt's independence and safety with mobility. Will benefit physical therapy in the acute setting to maxmize pt's function for safest d/c plan.  Agree pt will need some type of post-acute rehab prior to d/c home. Will have CIR screen for appropriateness, otherwise will need SNF.     PT Assessment  Patient needs continued PT services    Follow Up Recommendations   CIR     Does the patient have the potential to tolerate intense rehabilitation    potentially  Barriers to Discharge Decreased caregiver support son works occasionally    Equipment Recommendations     Recommendations for Other Services     Frequency Min 3X/week    Precautions / Restrictions Precautions Precautions: Fall Restrictions Weight Bearing Restrictions: No   Pertinent Vitals/Pain Pt on 1 liter O2 initially, on RA pt 93% sitting EOB, with ambulation per SaO2 monitor pt dropped to 81% and pt with increase shortness of breath so sat pt and instructed her in coughing and her SaO2 rebounded to 90s      Mobility  Bed Mobility Bed Mobility: Supine to Sit Supine to Sit: 3: Mod assist Details for Bed Mobility Assistance: mod sequencing cues and facilitation for follow through due to weakness Transfers Transfers: Sit to Stand;Stand to Sit Sit to Stand: 3: Mod assist;With upper extremity assist;From bed Stand to Sit: 3: Mod assist;With upper extremity assist;To chair/3-in-1;With armrests Details for Transfer Assistance: cues for hand placement, facilitation for anterior translation of trunk over BOS  and power up; assist for stability initially as pt with posterior lean Ambulation/Gait Ambulation/Gait Assistance: 3: Mod assist Ambulation Distance (Feet): 8 Feet Assistive device: Rolling walker Ambulation/Gait Assistance Details: cues for tall posture and safe technique with RW; pt quickly fatigued increasing her flexed posture as she tired, SaO2 also dropped on RA (81%) but once pt coughed it bounced back to low 90s; 1 liter O2 replaced following ambulation however Gait Pattern: Trunk flexed;Shuffle;Ataxic General Gait Details: larger step on right  Stairs: No Wheelchair Mobility Wheelchair Mobility: No              PT Diagnosis: Difficulty walking;Abnormality of gait;Generalized weakness  PT Problem List: Decreased strength;Decreased activity tolerance;Decreased balance;Decreased mobility;Decreased cognition;Decreased knowledge of use of DME;Cardiopulmonary status limiting activity PT Treatment Interventions: DME instruction;Gait training;Functional mobility training;Therapeutic activities;Therapeutic exercise;Balance training;Patient/family education;Neuromuscular re-education   PT Goals Acute Rehab PT Goals PT Goal Formulation: With patient Time For Goal Achievement: 11/12/12 Potential to Achieve Goals: Good Pt will go Supine/Side to Sit: with modified independence PT Goal: Supine/Side to Sit - Progress: Goal set today Pt will go Sit to Supine/Side: with modified independence PT Goal: Sit to Supine/Side - Progress: Goal set today Pt will go Sit to Stand: with supervision PT Goal: Sit to Stand - Progress: Goal set today Pt will go Stand to Sit: with supervision PT Goal: Stand to Sit - Progress: Goal set today Pt will Transfer Bed to Chair/Chair to Bed: with supervision PT Transfer Goal: Bed to Chair/Chair to Bed - Progress: Goal set today Pt will Ambulate: 51 - 150 feet;with supervision;with least restrictive assistive device PT Goal: Ambulate -  Progress: Goal set today Pt  will Perform Home Exercise Program: with supervision, verbal cues required/provided PT Goal: Perform Home Exercise Program - Progress: Goal set today  Visit Information  Last PT Received On: 11/05/12 Assistance Needed: +2 (+2 helpful)    Subjective Data  Subjective: I haven't been getting up a lot because everytime I do I cough.    Prior Functioning  Home Living Lives With: Son Available Help at Discharge: Family;Available PRN/intermittently (son works, not on a regular schedule) Type of Home: House Home Access: Stairs to enter [employer] of Steps: 2-3 Entrance Stairs-Rails: None Home Layout: Two level;Full bath on main level Alternate Level Stairs-Number of Steps: flight to her bedroom Alternate Level Stairs-Rails: Right;Left Bathroom Shower/Tub: Walk-in Contractor: Standard Home Adaptive Equipment: Straight cane;Walker - four wheeled;Bedside commode/3-in-1;Shower chair with back;Wheelchair - manual Additional Comments: hurricane? Prior Function Able to Take Stairs?: Yes Driving: Yes Vocation: Retired Comments: had an aide come 2x/wk for a few weeks after her last admission but pt couldn't afford it Communication Communication: No difficulties    Cognition  Overall Cognitive Status: Impaired Area of Impairment: Memory;Problem solving Arousal/Alertness: Lethargic Orientation Level: Disoriented to;Time Behavior During Session: WFL for tasks performed Cognition - Other Comments: slow processing    Extremity/Trunk Assessment Right Upper Extremity Assessment RUE ROM/Strength/Tone:  (see OT note) Left Upper Extremity Assessment LUE ROM/Strength/Tone:  (see OT note) Right Lower Extremity Assessment RLE ROM/Strength/Tone: Deficits RLE ROM/Strength/Tone Deficits: generally weak, grossly 3-4/5 Left Lower Extremity Assessment LLE ROM/Strength/Tone: Deficits LLE ROM/Strength/Tone Deficits: generally weak, grossly 3-4/5 Trunk Assessment Trunk  Assessment: Kyphotic   Balance Balance Balance Assessed: Yes Static Standing Balance Static Standing - Balance Support: Bilateral upper extremity supported Static Standing - Level of Assistance: 4: Min assist;3: Mod assist (assist to prevent posterior lean)  End of Session PT - End of Session Equipment Utilized During Treatment: Gait belt Activity Tolerance: Patient tolerated treatment well Patient left: in chair;with call bell/phone within reach Nurse Communication: Mobility status  GP     [facility] HELEN 11/05/2012, 2:58 PM

## 2012-11-05 NOTE — Progress Notes (Signed)
Notified by monitor tech that pts tele appeared abnormal; reviewed strips and appeared to be abnormal regarding the pacemaker; Dr.Reddy paged and made aware, was asked to pass along to physician tomorrow for possible cards consult; will continue to monitor

## 2012-11-05 NOTE — Progress Notes (Signed)
Rehab Admissions Coordinator Note:  Patient was screened by Brock Ra for appropriateness for an Inpatient Acute Rehab Consult.  At this time, we are recommending Inpatient Rehab consult.  Need to see if pt's activity tolerance improves enough for CIR.  Brock Ra 11/05/2012, 3:09 PM  I can be reached at [phone].

## 2012-11-05 NOTE — Progress Notes (Signed)
PT Cancellation Note  Patient Details Name: [name] MRN: [id number] DOB: [birth year]   Cancelled Treatment:     Medical issues which prohibited therapy: K+ 2.9, will f/u later today once potassium correction initiated.    [facility] HELEN 11/05/2012, 9:13 AM Pager: [phone]

## 2012-11-06 DIAGNOSIS — E876 Hypokalemia: Secondary | ICD-10-CM

## 2012-11-06 LAB — BASIC METABOLIC PANEL
BUN: 9 mg/dL (ref 6–23)
Calcium: 8.4 mg/dL (ref 8.4–10.5)
Creatinine, Ser: 0.79 mg/dL (ref 0.50–1.10)
GFR calc Af Amer: 89 mL/min — ABNORMAL LOW (ref >90)
GFR calc non Af Amer: 77 mL/min — ABNORMAL LOW (ref >90)

## 2012-11-06 LAB — CBC
HCT: 23.7 % — ABNORMAL LOW (ref 36.0–46.0)
MCH: 30.1 pg (ref 26.0–34.0)
MCHC: 32.9 g/dL (ref 30.0–36.0)
MCV: 91.5 fL (ref 78.0–100.0)
Platelets: 162 10*3/uL (ref 150–400)
RDW: 17.1 % — ABNORMAL HIGH (ref 11.5–15.5)
WBC: 6.1 10*3/uL (ref 4.0–10.5)

## 2012-11-06 LAB — CULTURE, RESPIRATORY W GRAM STAIN

## 2012-11-06 MED ORDER — POTASSIUM CHLORIDE CRYS ER 20 MEQ PO TBCR
40.0000 meq | EXTENDED_RELEASE_TABLET | ORAL | Status: AC
  Administered 2012-11-06 (×3): 40 meq via ORAL
  Filled 2012-11-06 (×3): qty 2

## 2012-11-06 MED ORDER — DEXTROSE 5 % IV SOLN
2.0000 g | Freq: Two times a day (BID) | INTRAVENOUS | Status: DC
  Administered 2012-11-06 – 2012-11-07 (×2): 2 g via INTRAVENOUS
  Filled 2012-11-06 (×3): qty 2

## 2012-11-06 NOTE — Progress Notes (Signed)
Dr. Ardyth Harps called with orders for critical value. Will replete with PO potassium as ordered and monitor patient.

## 2012-11-06 NOTE — Care Management Note (Unsigned)
    Page 1 of 2   11/08/2012     2:59:26 PM   CARE MANAGEMENT NOTE 11/08/2012  Patient:  [name], [name]   Account Number:  [id number]  Date Initiated:  11/04/2012  Documentation initiated by:  Donn Pierini  Subjective/Objective Assessment:   Pt admitted with PNA     Action/Plan:   PTA pt lived at home with son, NCM to follow for pt progression and d/c planning   Anticipated DC Date:  11/08/2012   Anticipated DC Plan:  SKILLED NURSING FACILITY  In-house referral  Clinical Social Worker      DC Planning Services  CM consult      Choice offered to / List presented to:             Status of service:  In process, will continue to follow Medicare Important Message given?   (If response is "NO", the following Medicare IM given date fields will be blank) Date Medicare IM given:   Date Additional Medicare IM given:    Discharge Disposition:    Per UR Regulation:  Reviewed for med. necessity/level of care/duration of stay  If discussed at Long Length of Stay Meetings, dates discussed:    Comments:  11/08/2012 Coree Riester,RN,BSN [phone] PER [facility]US[facility] AT [facility], UNABLE TO EXTEND OFFER FOR IP REHAB, PER REHAB MD.  PT NOW IS AGREEABLE TO SHORT TERM SNF FOR REHAB.  BED SEARCH IN PROGRESS.  11/08/12 Maxim Bedel,RN,BSN [phone] NO IP REHAB BEDS AVAILABLE AT THIS TIME, AND NONE ANTICIPATED UNTIL 12/10.  WILL CHECK WITH [facility] TO SEE IF THEY MIGHT HAVE BED AVAILABILITY.  PT AGREEABLE TO CHECKING WITH [facility] FOR POSSIBLE ADMISSION.  FAXED REFERRAL TO [facility], [place] # [phone].  11/06/12 Brenden Rudman,RN,BSN [phone] REHAB CONSULT IN PROGRESS; PT STATES SHE ADAMANTLY REFUSES SNF PLACEMENT.  WILL FOLLOW.

## 2012-11-06 NOTE — Progress Notes (Signed)
CRITICAL VALUE ALERT  Critical value received:  K 2.6  Date of notification:  11/06/12  Time of notification:  0700  Critical value read back:yes  Nurse who received alert:  Lenise Herald RN  MD notified (1st page):  Philip Aspen, MD  Time of first page:  0700  Still awaiting a response. Passed info on to day shift nurse, Cabin crew.

## 2012-11-06 NOTE — Progress Notes (Addendum)
[facility]             Progress Note   Subjective: Complains of feeling weak.  Objective: Vital signs in last 24 hours: Temp:  [98.4 F (36.9 C)-98.7 F (37.1 C)] 98.4 F (36.9 C) (12/04 4098) Pulse Rate:  [83-104] 104  (12/04 0632) Resp:  [20-23] 20  (12/04 0632) BP: (145-176)/(65-93) 145/88 mmHg (12/04 0632) SpO2:  [81 %-100 %] 98 % (12/04 1191) Weight change:  Last BM Date: 11/05/12  Intake/Output from previous day: 12/03 0701 - 12/04 0700 In: 120 [P.O.:120] Out: 2200 [Urine:2200] Total I/O In: 360 [P.O.:360] Out: 775 [Urine:775]   Physical Exam: General: Alert, awake, oriented x3, in no acute distress. HEENT: No bruits, no goiter. Heart: Regular rate and rhythm, without murmurs, rubs, gallops. Lungs: Clear to auscultation bilaterally. Abdomen: Soft, nontender, nondistended, positive bowel sounds. Extremities: No clubbing cyanosis or edema with positive pedal pulses.    Lab Results: Basic Metabolic Panel:  Basename 11/06/12 0527 11/05/12 0535  NA 139 139  K 2.6* 2.9*  CL 104 104  CO2 27 23  GLUCOSE 92 115*  BUN 9 12  CREATININE 0.79 0.82  CALCIUM 8.4 8.8  MG -- --  PHOS -- --   CBC:  Basename 11/06/12 0527 11/05/12 0535  WBC 6.1 8.5  NEUTROABS -- --  HGB 7.8* 8.1*  HCT 23.7* 25.0*  MCV 91.5 91.2  PLT 162 190    Recent Results (from the past 240 hour(s))  URINE CULTURE     Status: Normal   Collection Time   11/02/12  9:52 PM      Component Value Range Status Comment   Specimen Description URINE, CLEAN CATCH   Final    Special Requests NONE   Final    Culture  Setup Time 11/03/2012 04:26   Final    Colony Count 85,000 COLONIES/ML   Final    Culture     Final    Value: Multiple bacterial morphotypes present, none predominant. Suggest appropriate recollection if clinically indicated.   Report Status 11/04/2012 FINAL   Final   CULTURE, BLOOD (ROUTINE X 2)     Status: Normal (Preliminary result)   Collection Time   11/02/12   9:55 PM      Component Value Range Status Comment   Specimen Description BLOOD HAND LEFT   Final    Special Requests BOTTLES DRAWN AEROBIC AND ANAEROBIC 10CC EA   Final    Culture  Setup Time 11/03/2012 03:58   Final    Culture     Final    Value:        BLOOD CULTURE RECEIVED NO GROWTH TO DATE CULTURE WILL BE HELD FOR 5 DAYS BEFORE ISSUING A FINAL NEGATIVE REPORT   Report Status PENDING   Incomplete   CULTURE, BLOOD (ROUTINE X 2)     Status: Normal (Preliminary result)   Collection Time   11/02/12 10:13 PM      Component Value Range Status Comment   Specimen Description BLOOD ARM RIGHT   Final    Special Requests BOTTLES DRAWN AEROBIC ONLY   Final    Culture  Setup Time 11/03/2012 03:58   Final    Culture     Final    Value:        BLOOD CULTURE RECEIVED NO GROWTH TO DATE CULTURE WILL BE HELD FOR 5 DAYS BEFORE ISSUING A FINAL NEGATIVE REPORT   Report Status PENDING   Incomplete   MRSA PCR SCREENING  Status: Normal   Collection Time   11/03/12 12:54 AM      Component Value Range Status Comment   MRSA by PCR NEGATIVE  NEGATIVE Final   CULTURE, EXPECTORATED SPUTUM-ASSESSMENT     Status: Normal   Collection Time   11/03/12  2:48 AM      Component Value Range Status Comment   Specimen Description SPU   Final    Special Requests Normal   Final    Sputum evaluation     Final    Value: THIS SPECIMEN IS ACCEPTABLE. RESPIRATORY CULTURE REPORT TO FOLLOW.   Report Status 11/03/2012 FINAL   Final   CULTURE, RESPIRATORY     Status: Normal   Collection Time   11/03/12  2:48 AM      Component Value Range Status Comment   Specimen Description SPUTUM   Final    Special Requests ADDED 0336   Final    Gram Stain     Final    Value: ABUNDANT WBC PRESENT, PREDOMINANTLY PMN     FEW SQUAMOUS EPITHELIAL CELLS PRESENT     MODERATE GRAM POSITIVE RODS     FEW GRAM POSITIVE COCCI IN PAIRS     FEW GRAM NEGATIVE COCCI   Culture     Final    Value: MODERATE STAPHYLOCOCCUS AUREUS     Note:  RIFAMPIN AND GENTAMICIN SHOULD NOT BE USED AS SINGLE DRUGS FOR TREATMENT OF STAPH INFECTIONS.     MODERATE MORAXELLA CATARRHALIS(BRANHAMELLA)     Note: BETA LACTAMASE POSITIVE   Report Status 11/06/2012 FINAL   Final    Organism ID, Bacteria STAPHYLOCOCCUS AUREUS   Final     Studies/Results: No results found.  Medications: Scheduled Meds:   . allopurinol  100 mg Oral BID  . calcitRIOL  0.25 mcg Oral Daily  . ceFEPime (MAXIPIME) IV  2 g Intravenous Q12H  . clopidogrel  75 mg Oral Q breakfast  . DULoxetine  60 mg Oral Daily  . enoxaparin (LOVENOX) injection  40 mg Subcutaneous Q24H  . feeding supplement  237 mL Oral BID WC  . feeding supplement  1 Container Oral BID BM  . fesoterodine  4 mg Oral Daily  . guaiFENesin  600 mg Oral BID  . iron polysaccharides  150 mg Oral BID  . levofloxacin (LEVAQUIN) IV  750 mg Intravenous Q24H  . levothyroxine  25 mcg Oral QAC breakfast  . lisinopril  5 mg Oral Daily  . pantoprazole  40 mg Oral BID  . potassium chloride  40 mEq Oral Q4H  . simvastatin  40 mg Oral QPM  . sotalol  120 mg Oral BID  . vancomycin  750 mg Intravenous Q12H  . [DISCONTINUED] ceFEPime (MAXIPIME) IV  2 g Intravenous Q24H   Continuous Infusions:  PRN Meds:.acetaminophen, diphenhydrAMINE, ibuprofen, ondansetron (ZOFRAN) IV, ondansetron  Assessment/Plan:  Principal Problem:  *PNA (pneumonia) Active Problems:  Hypertension  Hyperlipidemia  Anemia  Altered mental state  Atrial fibrillation  Hypokalemia   Acute Respiratory Failure 2/2 HCAP -Improving on IV antibiotics. -Will transition to PO antibiotics in the am. -Afebrile, leukocytosis resolved.  Anemia -On PO Fe supplementation. -Transfuse if Hb <7.0.  Hypokalemia -Replete PO. -Check Mg level in am.  Hypertension  Improved. Restart lisinopril 5 mg daily, titrate up as tolerated. Continue sotalol.   Hyperlipidemia  Continue statin.   History of Gout  Continue allopurinol.   Hypothyroidism   Continue levothyroxine.   Depression/anxiety  Continue Cymbalta.   History of TIA  Continue Plavix.   Generalized weakness with falls at home  -PT recs CIR vs SNF. CIR consult requested. -Patient is adamant that she will not be going to SNF; she would be agreeable to CIR.   Protein calorie malnutrition  Continue Ensure. Appreciate nutrition consultation.   Nausea and vomiting prior to admission  Likely related to pneumonia and excessive cough. Continue to monitor.   Urinary incontinence  Has chronic urinary incontinence.  Foley catheter placed to prevent further skin breakdown. Discontinue if patient's strength improves during the hospital stay.   Prophylaxis  Lovenox.     Time spent coordinating care: 35 minutes.   LOS: 4 days   HERNANDEZ ACOSTA,ESTELA [facility] Pager: [phone] 11/06/2012, 1:36 PM

## 2012-11-06 NOTE — Consult Note (Signed)
Physical Medicine and Rehabilitation Consult Reason for Consult: Deconditioned Referring Physician:  Dr. Philip Aspen   HPI: [name] is a 78 y.o. female with history of HTN, recent TIA 10/13, who was admitted 11/02/12 with weakness confusion, N/V and fever. She was noted to have febrile with T-102 and  LLL PNA. She was started on IV antibiotics and  IVF. Productive cough is resolving with and improvement in overall symptoms. Therapies initiated yesterday and patient noted to be deconditioned as well as hypoxic with activity. Therapy team recommending CIR.    Review of Systems  Eyes: Negative for blurred vision and double vision.  Respiratory: Positive for cough and shortness of breath.   Cardiovascular: Negative for chest pain and palpitations.  Gastrointestinal: Negative for heartburn and nausea.  Musculoskeletal: Positive for myalgias and falls (decreased after therapy).  Neurological: Positive for weakness.  Psychiatric/Behavioral: The patient is nervous/anxious and has insomnia (due to anxiety/cough).    Past Medical History  Diagnosis Date  . Hypertension   . Hyperlipidemia   . GERD (gastroesophageal reflux disease)   . Dysphagia     appt with Molly Maduro Buccini upcoming  . Chronic recurrent sinusitis   . Recurrent UTI   . Atrial fibrillation     ???  . Arthritis   . TIA (transient ischemic attack)    Past Surgical History  Procedure Date  . Pacemaker insertion   . Back surgery 1985  . Shoulder surgery 2008    Left  (Dr. Ranell Patrick)  . Total knee arthroplasty 2000, 2001    bilateral (Dr. Despina Hick), left 2000, right 2001  . Cataract extraction 1986, 2003    left 1986, right 2003  . Vaginal hysterectomy 1969  . Bilateral salpingoophorectomy 1971  . Tonsillectomy 1951  . Appendectomy 1952  . Mastoidectomy 1970  . Sinus exploration 1972  . Knee arthroscopy 1988, 1995, 2000  . Hemorrhoid surgery 2009  . Repair / reconstruction interphalangeal joint 2004    R  thumb  . Cystoscopy 1992  . Insert / replace / remove pacemaker    History reviewed. No pertinent family history.  Social History:  Lives with son. 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 She reports that she quit smoking about 30 years ago. Her smoking use included Cigarettes. She has a 15 pack-year smoking history. She does not have any smokeless tobacco history on file. She has a mixed drink or beer on rare occasion. She reports that she does not use illicit drugs.  Allergies: No Known Allergies  Medications Prior to Admission  Medication Sig Dispense Refill  . allopurinol (ZYLOPRIM) 100 MG tablet Take 100 mg by mouth 2 (two) times daily.      . calcitRIOL (ROCALTROL) 0.25 MCG capsule Take 0.25 mcg by mouth daily.      . clopidogrel (PLAVIX) 75 MG tablet Take 75 mg by mouth daily with breakfast.      . CRANBERRY PO Take 2 capsules by mouth 2 (two) times daily.      Marland Kitchen dextromethorphan-guaiFENesin (MUCINEX DM) 30-600 MG per 12 hr tablet Take 1 tablet by mouth every 12 (twelve) hours.      . DULoxetine (CYMBALTA) 60 MG capsule Take 60 mg by mouth daily.      Marland Kitchen ipratropium (ATROVENT) 0.06 % nasal spray Place 2 sprays into the nose every morning.      Marland Kitchen levothyroxine (SYNTHROID, LEVOTHROID) 25 MCG tablet Take 25 mcg by mouth daily.      Marland Kitchen lisinopril (PRINIVIL,ZESTRIL) 20  MG tablet Take 20 mg by mouth daily.      . Multiple Vitamin (MULTIVITAMIN WITH MINERALS) TABS Take 1 tablet by mouth daily.      Marland Kitchen omega-3 acid ethyl esters (LOVAZA) 1 G capsule Take 2 g by mouth daily.      . pantoprazole (PROTONIX) 40 MG tablet Take 40 mg by mouth 2 (two) times daily.      . potassium chloride (K-DUR) 10 MEQ tablet Take 20 mEq by mouth 2 (two) times daily.      . simvastatin (ZOCOR) 40 MG tablet Take 40 mg by mouth every evening.      . sotalol (BETAPACE) 120 MG tablet Take 120 mg by mouth 2 (two) times daily.      Marland Kitchen tolterodine (DETROL LA) 4 MG 24 hr capsule Take 4 mg by mouth at  bedtime.        Home: Home Living Lives With: Son Available Help at Discharge: Family;Available PRN/intermittently (son works, not on a regular schedule) Type of Home: House Home Access: Stairs to enter [employer] of Steps: 2-3 Entrance Stairs-Rails: None Home Layout: Two level;Full bath on main level Alternate Level Stairs-Number of Steps: flight to her bedroom Alternate Level Stairs-Rails: Right;Left Bathroom Shower/Tub: Walk-in Contractor: Standard Home Adaptive Equipment: Straight cane;Walker - four wheeled;Bedside commode/3-in-1;Shower chair with back;Wheelchair - manual Additional Comments: hurricane?  Functional History: Prior Function Able to Take Stairs?: Yes Driving: Yes Vocation: Retired Comments: had an aide come 2x/wk for a few weeks after her last admission but pt couldn't afford it Functional Status:  Mobility: Bed Mobility Bed Mobility: Supine to Sit Supine to Sit: 3: Mod assist Transfers Transfers: Sit to Stand;Stand to Sit Sit to Stand: 3: Mod assist;With upper extremity assist;From bed Stand to Sit: 3: Mod assist;With upper extremity assist;To chair/3-in-1;With armrests Ambulation/Gait Ambulation/Gait Assistance: 3: Mod assist Ambulation Distance (Feet): 8 Feet Assistive device: Rolling walker Ambulation/Gait Assistance Details: cues for tall posture and safe technique with RW; pt quickly fatigued increasing her flexed posture as she tired, SaO2 also dropped on RA (81%) but once pt coughed it bounced back to low 90s; 1 liter O2 replaced following ambulation however Gait Pattern: Trunk flexed;Shuffle;Ataxic General Gait Details: larger step on right  Stairs: No Wheelchair Mobility Wheelchair Mobility: No  ADL: ADL Eating/Feeding: Simulated;Set up Where Assessed - Eating/Feeding: Chair Grooming: Simulated;Set up Where Assessed - Grooming: Supported sitting Upper Body Bathing: Simulated;Set up;Supervision/safety Where  Assessed - Upper Body Bathing: Supported sitting Lower Body Bathing: Simulated;Maximal assistance Where Assessed - Lower Body Bathing: Supported sit to stand Upper Body Dressing: Simulated;Moderate assistance Where Assessed - Upper Body Dressing: Supported sitting Lower Body Dressing: Simulated;+1 Total assistance Where Assessed - Lower Body Dressing: Supported sit to Pharmacist, hospital: Simulated;Moderate assistance Toilet Transfer Method: Sit to Barista:  (From bed out door sit in recliner) Transfers/Ambulation Related to ADLs: Mod A sit to stand and stand to sit as well as ambulation with additional person needed for lines/tubes.  Cognition: Cognition Arousal/Alertness: Lethargic Orientation Level: Oriented X4 Cognition Overall Cognitive Status: Impaired Area of Impairment: Memory;Problem solving Arousal/Alertness: Lethargic Orientation Level: Disoriented to;Time Behavior During Session: WFL for tasks performed Cognition - Other Comments: slow processing  Blood pressure 145/88, pulse 104, temperature 98.4 F (36.9 C), temperature source Oral, resp. rate 20, height 5\' 5"  (1.651 m), weight 72.2 kg (159 lb 2.8 oz), SpO2 98.00%. Physical Exam  Nursing note and vitals reviewed. Constitutional: She is oriented to person, place, and time.  Frail appearing  HENT:  Head: Normocephalic and atraumatic.  Right Ear: External ear normal.  Left Ear: External ear normal.  Mouth/Throat: Oropharynx is clear and moist.  Eyes: Conjunctivae normal and EOM are normal. Pupils are equal, round, and reactive to light.  Neck: Normal range of motion.  Cardiovascular: Normal rate and regular rhythm.   Pulmonary/Chest: Tachypnea (with conversation) noted. No respiratory distress. She has rales in the left lower field.  Abdominal: Soft. Bowel sounds are normal.  Musculoskeletal: She exhibits no edema and no tenderness.       Shortening of LUE due to prior  surgery/fracture. Well healed old incisions left shoulder and bilateral knees.   Neurological: She is alert and oriented to person, place, and time. No cranial nerve deficit.       RUE is 4/5. LUE limited by left shoulder--distally 3/5.  LE's are 3+ proximally to 4/5 distally. Stocking glove sensory loss to ankles bilaterally . DTRs 1+.  Skin: Skin is warm and dry.  Psychiatric: Her speech is normal and behavior is normal. Judgment and thought content normal. Her mood appears anxious.    Results for orders placed during the hospital encounter of 11/02/12 (from the past 24 hour(s))  CBC     Status: Abnormal   Collection Time   11/06/12  5:27 AM      Component Value Range   WBC 6.1  4.0 - 10.5 K/uL   RBC 2.59 (*) 3.87 - 5.11 MIL/uL   Hemoglobin 7.8 (*) 12.0 - 15.0 g/dL   HCT 16.1 (*) 09.6 - 04.5 %   MCV 91.5  78.0 - 100.0 fL   MCH 30.1  26.0 - 34.0 pg   MCHC 32.9  30.0 - 36.0 g/dL   RDW 40.9 (*) 81.1 - 91.4 %   Platelets 162  150 - 400 K/uL  BASIC METABOLIC PANEL     Status: Abnormal   Collection Time   11/06/12  5:27 AM      Component Value Range   Sodium 139  135 - 145 mEq/L   Potassium 2.6 (*) 3.5 - 5.1 mEq/L   Chloride 104  96 - 112 mEq/L   CO2 27  19 - 32 mEq/L   Glucose, Bld 92  70 - 99 mg/dL   BUN 9  6 - 23 mg/dL   Creatinine, Ser 7.82  0.50 - 1.10 mg/dL   Calcium 8.4  8.4 - 95.6 mg/dL   GFR calc non Af Amer 77 (*) >90 mL/min   GFR calc Af Amer 89 (*) >90 mL/min   No results found.  Assessment/Plan: Diagnosis: deconditioning after pneumonia, gait disorder with peripheral neuropathy 1. Does the need for close, 24 hr/day medical supervision in concert with the patient's rehab needs make it unreasonable for this patient to be served in a less intensive setting? Yes 2. Co-Morbidities requiring supervision/potential complications: htn, a fib, anemia, FTT 3. Due to bladder management, bowel management, safety, skin/wound care, disease management, medication administration, pain  management and patient education, does the patient require 24 hr/day rehab nursing? Yes 4. Does the patient require coordinated care of a physician, rehab nurse, PT (1-2 hrs/day, 5 days/week) and OT (1-2\ hrs/day, 5 days/week) to address physical and functional deficits in the context of the above medical diagnosis(es)? Yes Addressing deficits in the following areas: balance, endurance, locomotion, strength, transferring, bowel/bladder control, bathing, dressing, feeding, grooming, toileting and psychosocial support 5. Can the patient actively participate in an intensive therapy program of at least 3 hrs  of therapy per day at least 5 days per week? Yes 6. The potential for patient to make measurable gains while on inpatient rehab is excellent 7. Anticipated functional outcomes upon discharge from inpatient rehab are supervision with PT, supervision to minimal assist with OT, n/a with SLP. 8. Estimated rehab length of stay to reach the above functional goals is: 2 weeks 9. Does the patient have adequate social supports to accommodate these discharge functional goals? Yes 10. Anticipated D/C setting: Home 11. Anticipated post D/C treatments: HH therapy 12. Overall Rehab/Functional Prognosis: excellent  RECOMMENDATIONS: This patient's condition is appropriate for continued rehabilitative care in the following setting: CIR Patient has agreed to participate in recommended program. Yes Note that insurance prior authorization may be required for reimbursement for recommended care.  Comment:Rehab RN to follow up.   Ivory Broad, MD     11/06/2012

## 2012-11-06 NOTE — Progress Notes (Signed)
ANTIBIOTIC CONSULT NOTE - INITIAL  Pharmacy Consult for Vancomycin, Levaquin, Cefepime  Indication: Suspected PNA   No Known Allergies  Patient Measurements: Height: 5\' 5"  (165.1 cm) Weight: 159 lb 2.8 oz (72.2 kg) IBW/kg (Calculated) : 57    Vital Signs: Temp: 98.4 F (36.9 C) (12/04 3244) Temp src: Oral (12/04 0632) BP: 145/88 mmHg (12/04 0102) Pulse Rate: 104  (12/04 0632) Intake/Output from previous day: 12/03 0701 - 12/04 0700 In: 120 [P.O.:120] Out: 2200 [Urine:2200] Intake/Output from this shift: Total I/O In: 360 [P.O.:360] Out: 775 [Urine:775]  Labs:  [facility] 11/06/12 0527 11/05/12 0535 11/04/12 0500  WBC 6.1 8.5 10.3  HGB 7.8* 8.1* 8.3*  PLT 162 190 168  LABCREA -- -- --  CREATININE 0.79 0.82 1.08   Estimated Creatinine Clearance: 56.8 ml/min (by C-G formula based on Cr of 0.79). No results found for this basename: VANCOTROUGH:2,VANCOPEAK:2,VANCORANDOM:2,GENTTROUGH:2,GENTPEAK:2,GENTRANDOM:2,TOBRATROUGH:2,TOBRAPEAK:2,TOBRARND:2,AMIKACINPEAK:2,AMIKACINTROU:2,AMIKACIN:2, in the last 72 hours   Microbiology: Recent Results (from the past 720 hour(s))  URINE CULTURE     Status: Normal   Collection Time   11/02/12  9:52 PM      Component Value Range Status Comment   Specimen Description URINE, CLEAN CATCH   Final    Special Requests NONE   Final    Culture  Setup Time 11/03/2012 04:26   Final    Colony Count 85,000 COLONIES/ML   Final    Culture     Final    Value: Multiple bacterial morphotypes present, none predominant. Suggest appropriate recollection if clinically indicated.   Report Status 11/04/2012 FINAL   Final   CULTURE, BLOOD (ROUTINE X 2)     Status: Normal (Preliminary result)   Collection Time   11/02/12  9:55 PM      Component Value Range Status Comment   Specimen Description BLOOD HAND LEFT   Final    Special Requests BOTTLES DRAWN AEROBIC AND ANAEROBIC 10CC EA   Final    Culture  Setup Time 11/03/2012 03:58   Final    Culture     Final     Value:        BLOOD CULTURE RECEIVED NO GROWTH TO DATE CULTURE WILL BE HELD FOR 5 DAYS BEFORE ISSUING A FINAL NEGATIVE REPORT   Report Status PENDING   Incomplete   CULTURE, BLOOD (ROUTINE X 2)     Status: Normal (Preliminary result)   Collection Time   11/02/12 10:13 PM      Component Value Range Status Comment   Specimen Description BLOOD ARM RIGHT   Final    Special Requests BOTTLES DRAWN AEROBIC ONLY   Final    Culture  Setup Time 11/03/2012 03:58   Final    Culture     Final    Value:        BLOOD CULTURE RECEIVED NO GROWTH TO DATE CULTURE WILL BE HELD FOR 5 DAYS BEFORE ISSUING A FINAL NEGATIVE REPORT   Report Status PENDING   Incomplete   MRSA PCR SCREENING     Status: Normal   Collection Time   11/03/12 12:54 AM      Component Value Range Status Comment   MRSA by PCR NEGATIVE  NEGATIVE Final   CULTURE, EXPECTORATED SPUTUM-ASSESSMENT     Status: Normal   Collection Time   11/03/12  2:48 AM      Component Value Range Status Comment   Specimen Description SPU   Final    Special Requests Normal   Final    Sputum  evaluation     Final    Value: THIS SPECIMEN IS ACCEPTABLE. RESPIRATORY CULTURE REPORT TO FOLLOW.   Report Status 11/03/2012 FINAL   Final   CULTURE, RESPIRATORY     Status: Normal   Collection Time   11/03/12  2:48 AM      Component Value Range Status Comment   Specimen Description SPUTUM   Final    Special Requests ADDED 0336   Final    Gram Stain     Final    Value: ABUNDANT WBC PRESENT, PREDOMINANTLY PMN     FEW SQUAMOUS EPITHELIAL CELLS PRESENT     MODERATE GRAM POSITIVE RODS     FEW GRAM POSITIVE COCCI IN PAIRS     FEW GRAM NEGATIVE COCCI   Culture     Final    Value: MODERATE STAPHYLOCOCCUS AUREUS     Note: RIFAMPIN AND GENTAMICIN SHOULD NOT BE USED AS SINGLE DRUGS FOR TREATMENT OF STAPH INFECTIONS.     MODERATE MORAXELLA CATARRHALIS(BRANHAMELLA)     Note: BETA LACTAMASE POSITIVE   Report Status 11/06/2012 FINAL   Final    Organism ID, Bacteria  STAPHYLOCOCCUS AUREUS   Final     Medical History: Past Medical History  Diagnosis Date  . Hypertension   . Hyperlipidemia   . GERD (gastroesophageal reflux disease)   . Dysphagia     appt with Molly Maduro Buccini upcoming  . Chronic recurrent sinusitis   . Recurrent UTI   . Atrial fibrillation     ???  . Arthritis   . TIA (transient ischemic attack)    Assessment: 78 yo F admitted 11/30 for suspected pneumonia. Pharmacy consulted to renally adjust and dose Vanc/Cefepime/Levaquin. Calculated CrCl now >50 ml/min MSSA and beta lactamase positive  Moraxella in sputum culture  Goal of Therapy:  Vancomycin trough level 15-20 mcg/ml  Plan:  -Consider narrowing antibiotics -Cont Vancomycin 750mg  IV q12h -Change Cefepime 2g IV q12 -Cont to monitor renal function   Thank you, Talbert Cage, Pharm.D. Clinical Pharmacist  11/06/2012 12:30 PM

## 2012-11-07 DIAGNOSIS — J159 Unspecified bacterial pneumonia: Secondary | ICD-10-CM

## 2012-11-07 DIAGNOSIS — R5381 Other malaise: Secondary | ICD-10-CM

## 2012-11-07 LAB — BASIC METABOLIC PANEL
CO2: 28 mEq/L (ref 19–32)
Calcium: 8.7 mg/dL (ref 8.4–10.5)
Creatinine, Ser: 0.71 mg/dL (ref 0.50–1.10)
GFR calc non Af Amer: 80 mL/min — ABNORMAL LOW (ref >90)
Sodium: 140 mEq/L (ref 135–145)

## 2012-11-07 LAB — CBC
MCH: 29.3 pg (ref 26.0–34.0)
MCV: 91.3 fL (ref 78.0–100.0)
Platelets: 204 10*3/uL (ref 150–400)
RBC: 2.76 MIL/uL — ABNORMAL LOW (ref 3.87–5.11)
RDW: 17.2 % — ABNORMAL HIGH (ref 11.5–15.5)
WBC: 7.1 10*3/uL (ref 4.0–10.5)

## 2012-11-07 LAB — MAGNESIUM: Magnesium: 1.2 mg/dL — ABNORMAL LOW (ref 1.5–2.5)

## 2012-11-07 MED ORDER — LEVOFLOXACIN 500 MG PO TABS
500.0000 mg | ORAL_TABLET | Freq: Every day | ORAL | Status: DC
  Administered 2012-11-07 – 2012-11-08 (×2): 500 mg via ORAL
  Filled 2012-11-07 (×2): qty 1

## 2012-11-07 NOTE — Progress Notes (Signed)
Rehab admissions - Evaluated for possible admission.  I spoke with patient and left her rehab booklets.  She is very sleepy this morning and wants to rest now.  I will check back with her later today.  Call me for questions.  #[phone]

## 2012-11-07 NOTE — Discharge Summary (Addendum)
Physician Discharge Summary  Patient ID: [name] MRN: [id number] DOB/AGE: March 10, 1931 78 y.o.  Admit date: 11/02/2012 Discharge date: 11/07/2012  Primary Care Physician:  Johny Blamer, MD   Discharge Diagnoses:    Principal Problem:  *PNA (pneumonia) Active Problems:  Hypertension  Hyperlipidemia  Anemia  Altered mental state  Atrial fibrillation  Hypokalemia  Physical deconditioning      Medication List     As of 11/07/2012 11:02 AM    ASK your doctor about these medications         allopurinol 100 MG tablet   Commonly known as: ZYLOPRIM   Take 100 mg by mouth 2 (two) times daily.      calcitRIOL 0.25 MCG capsule   Commonly known as: ROCALTROL   Take 0.25 mcg by mouth daily.      clopidogrel 75 MG tablet   Commonly known as: PLAVIX   Take 75 mg by mouth daily with breakfast.      CRANBERRY PO   Take 2 capsules by mouth 2 (two) times daily.      dextromethorphan-guaiFENesin 30-600 MG per 12 hr tablet   Commonly known as: MUCINEX DM   Take 1 tablet by mouth every 12 (twelve) hours.      DULoxetine 60 MG capsule   Commonly known as: CYMBALTA   Take 60 mg by mouth daily.      ipratropium 0.06 % nasal spray   Commonly known as: ATROVENT   Place 2 sprays into the nose every morning.      levothyroxine 25 MCG tablet   Commonly known as: SYNTHROID, LEVOTHROID   Take 25 mcg by mouth daily.      lisinopril 20 MG tablet   Commonly known as: PRINIVIL,ZESTRIL   Take 20 mg by mouth daily.      multivitamin with minerals Tabs   Take 1 tablet by mouth daily.      omega-3 acid ethyl esters 1 G capsule   Commonly known as: LOVAZA   Take 2 g by mouth daily.      pantoprazole 40 MG tablet   Commonly known as: PROTONIX   Take 40 mg by mouth 2 (two) times daily.      potassium chloride 10 MEQ tablet   Commonly known as: K-DUR   Take 20 mEq by mouth 2 (two) times daily.      simvastatin 40 MG tablet   Commonly known as: ZOCOR   Take 40 mg by  mouth every evening.      sotalol 120 MG tablet   Commonly known as: BETAPACE   Take 120 mg by mouth 2 (two) times daily.      tolterodine 4 MG 24 hr capsule   Commonly known as: DETROL LA   Take 4 mg by mouth at bedtime.           Disposition and Follow-up:  Will be discharged to CIR today in stable and improved condition. Would continue levaquin for an additional 5 days to complete treatment for her PNA.  Consults:  None.   Significant Diagnostic Studies:  No results found.  Brief H and P: For complete details please refer to admission H and P, but in brief patient is a 78 y.o. female with a PMHx of recent TIAs who was brought in by EMS after being called for chief complaints of weakness and unable to walk. She was apparently well up until last night when she started having some cough. Cough is associated with  dark green phlegm. Patient continued to cough all night long. On the morning of admission the patient's cough got worse and was associated with vomiting. 4-5 episodes of vomiting which contained dark green colored vomitus. Patient also developed fever which was found to be 102. Patient was weak and unable to walk even with the help of a walker which is unusual for her. Patient was assisted by her son (who she stays with) to her room but developed confusion and was unable to speak or answer properly. Given that patient had recent episodes of TIA and recently finished physical therapy for the same the son got concerned about a stroke and hence called EMS. Code stroke was canceled given that patient had high-grade fevers in the ER. We were asked to admit her for further evaluation and management.     Hospital Course:  Principal Problem:  *PNA (pneumonia) Active Problems:  Hypertension  Hyperlipidemia  Anemia  Altered mental state  Atrial fibrillation  Hypokalemia  Physical deconditioning  Acute Respiratory Failure 2/2 HCAP  -Afebrile, no leukocytosis. -Will  transition to PO levaquin for an additional 5 days.  Anemia  -On PO Fe supplementation.  -Transfuse if Hb <7.0.   Hypokalemia  -3.4 today. -Continue to replete.   Hypomagnesemia -Mg is 1.2. -Will replete PO.  Hypertension  Improved. Restart lisinopril 5 mg daily, titrate up as tolerated. Continue sotalol.   Hyperlipidemia  Continue statin.   History of Gout  Continue allopurinol.   Hypothyroidism  Continue levothyroxine.   Depression/anxiety  Continue Cymbalta.   History of TIA  Continue Plavix.   Generalized weakness with falls at home  -To CIR today.  Protein calorie malnutrition  Continue Ensure. Appreciate nutrition consultation.   Nausea and vomiting prior to admission  Likely related to pneumonia and excessive cough. Continue to monitor.   Urinary incontinence  Has chronic urinary incontinence. Foley catheter placed to prevent further skin breakdown. Discontinue if patient's strength improves during the hospital stay.      Time spent on Discharge: Greater than 30 minutes.  SignedChaya Jan [facility] Pager: [phone] 11/07/2012, 11:02 AM    ADDENDUM: We have been informed today (11/08/12) that CIR is not anticipating any beds until Tuesday of next week. Patient has agreed to pursue SNF placement and I have been informed that a bed is available for her today. Will proceed with DC to SNF today.  Peggye Pitt, MD [facility]

## 2012-11-07 NOTE — Progress Notes (Signed)
Physical Therapy Treatment Patient Details Name: [name] MRN: [id number] DOB: March 25, 1931 Today's Date: 11/07/2012 Time: 4098-1191 PT Time Calculation (min): 33 min  PT Assessment / Plan / Recommendation Comments on Treatment Session  Pt ready for rehab.  Will need stability work for the trunk as she displays a moderate drop of left pelvis that I believe is not totally due to her scoliosis.    Follow Up Recommendations  CIR     Does the patient have the potential to tolerate intense rehabilitation     Barriers to Discharge        Equipment Recommendations  Other (comment) (TBD post acute)    Recommendations for Other Services    Frequency Min 3X/week   Plan Discharge plan remains appropriate;Frequency remains appropriate    Precautions / Restrictions Precautions Precautions: Fall Restrictions Weight Bearing Restrictions: No   Pertinent Vitals/Pain     Mobility  Bed Mobility Bed Mobility: Supine to Sit;Sitting - Scoot to Edge of Bed Supine to Sit: 4: Min assist Sitting - Scoot to [place] of Bed: 4: Min guard Details for Bed Mobility Assistance: stability assist to help come forward Transfers Transfers: Sit to Stand;Stand to Sit Sit to Stand: 3: Mod assist;With upper extremity assist;From bed Stand to Sit: 3: Mod assist;With upper extremity assist;To bed Details for Transfer Assistance: cues for hand placement and best technique; assist for translation of trunk forward over her BOS.  still with posterior lean initially. Ambulation/Gait Ambulation/Gait Assistance: 3: Mod assist Ambulation Distance (Feet): 10 Feet (around the bed and back an additional 10 after a short rest) Assistive device: Rolling walker Ambulation/Gait Assistance Details: vc's for frequent postural checks, better use of the RW; truncal support for pelvic stability (hip drop on left) and upright posture. Gait Pattern: Step-through pattern;Shuffle;Lateral trunk lean to left;Trunk flexed Stairs: No     Exercises     PT Diagnosis:    PT Problem List:   PT Treatment Interventions:     PT Goals Acute Rehab PT Goals Time For Goal Achievement: 11/12/12 Potential to Achieve Goals: Good PT Goal: Supine/Side to Sit - Progress: Progressing toward goal PT Goal: Sit to Supine/Side - Progress: Progressing toward goal PT Goal: Sit to Stand - Progress: Progressing toward goal PT Goal: Stand to Sit - Progress: Progressing toward goal PT Transfer Goal: Bed to Chair/Chair to Bed - Progress: Progressing toward goal PT Goal: Ambulate - Progress: Not met  Visit Information  Last PT Received On: 11/07/12 Assistance Needed: +2 (only had +1)    Subjective Data  Subjective: I have been leaning that way because of the curve in my back   Cognition  Overall Cognitive Status: Impaired Arousal/Alertness: Awake/alert Orientation Level: Appears intact for tasks assessed Behavior During Session: [facility] for tasks performed Cognition - Other Comments: slow processing    Balance  Static Standing Balance Static Standing - Balance Support: Bilateral upper extremity supported;During functional activity Static Standing - Level of Assistance: 4: Min assist;3: Mod assist  End of Session PT - End of Session Equipment Utilized During Treatment: Gait belt Activity Tolerance: Patient tolerated treatment well Patient left: in chair;with call bell/phone within reach Nurse Communication: Mobility status   GP     Yon Schiffman, Eliseo Gum 11/07/2012, 5:12 PM  11/07/2012  [place] Bing, PT [phone] (pager)

## 2012-11-08 DIAGNOSIS — J189 Pneumonia, unspecified organism: Secondary | ICD-10-CM

## 2012-11-08 LAB — BASIC METABOLIC PANEL
BUN: 9 mg/dL (ref 6–23)
Chloride: 101 mEq/L (ref 96–112)
Creatinine, Ser: 0.77 mg/dL (ref 0.50–1.10)
GFR calc Af Amer: >90 mL/min (ref >90)
GFR calc non Af Amer: 78 mL/min — ABNORMAL LOW (ref >90)
Potassium: 3 mEq/L — ABNORMAL LOW (ref 3.5–5.1)

## 2012-11-08 MED ORDER — LEVOFLOXACIN 500 MG PO TABS: 500.0000 mg | ORAL_TABLET | Freq: Every day | ORAL | Status: DC

## 2012-11-08 MED ORDER — POLYSACCHARIDE IRON COMPLEX 150 MG PO CAPS: 150.0000 mg | ORAL_CAPSULE | Freq: Two times a day (BID) | ORAL | Status: DC

## 2012-11-08 NOTE — Progress Notes (Signed)
Pt d/c to [facility] for rehab, transported via [facility].

## 2012-11-08 NOTE — Clinical Social Work Note (Signed)
CSW received consult to speak with Pt about SNF. Pt was planning and accepted into CIR but bed not available for several days. Pt previously declined SNF based on previous experience. CSW spoke with CM and am assisting in looking for other CIR options for Pt at this time.   Frederico Hamman, LCSW [phone]

## 2012-11-08 NOTE — Clinical Social Work Note (Signed)
CSW spoke to Pt and Pt's son re: dc plans. Pt was declined at [facility] d/t admission criteria for their program. Pt's information was sent out to area SNF and Pt and her son accepted a bed at [facility].  Pt will be transferred by non-emergency ambulance. Son will meet Pt at SNF.  MD updated and agreeable to dc plans.   Frederico Hamman, LCSW [phone]

## 2012-11-08 NOTE — Progress Notes (Signed)
Was informed during progression rounds that her DC to CIR was delayed because of lack of beds. Most likely she will leave today. No new changes. Will DC foley at patient's request.  Peggye Pitt, MD [facility] Pager: [phone]

## 2012-11-08 NOTE — Progress Notes (Signed)
Rehab admissions - I do not have a rehab bed available today and I did not have a rehab bed available yesterday for this patient.  Recommend pursuit of SNF or HH therapies at this point since I have no bed availability.  Call me for questions.  #[phone]

## 2012-11-09 LAB — CULTURE, BLOOD (ROUTINE X 2): Culture: NO GROWTH

## 2012-11-11 NOTE — Clinical Social Work Psychosocial (Signed)
Late entry d/t computer issues.  11/11/12 for 11/08/12.   Clinical Social Work Department BRIEF PSYCHOSOCIAL ASSESSMENT 11/11/2012  Patient:  RHYDER, BRATZ     Account Number:  [id number]     Admit date:  11/02/2012  Clinical Social Worker:  Thomasene Mohair  Date/Time:  11/08/2012 12:00 N  Referred by:  Care Management  Date Referred:  11/02/2012 Referred for  SNF Placement   Other Referral:   Interview type:  Patient Other interview type:   son    PSYCHOSOCIAL DATA Living Status:  ALONE Admitted from facility:   Level of care:   Primary support name:  Josephina Gip Primary support relationship to patient:  CHILD, ADULT Degree of support available:   strong    CURRENT CONCERNS Current Concerns  Post-Acute Placement   Other Concerns:    SOCIAL WORK ASSESSMENT / PLAN CSW was referred to Pt to assist with dc planning. Pt lives alone, has 1 adult son in the area and 1 adult daughter (and her family) in [place]. Pt was accepted into CIR program but no bed was available at [facility] and Pt was declined by [facility] for clinical reasons. Pt had declined SNF during hospitalization but now is reconsidering in order to get maximum therapy.  CSW spoke to Pt's son who is in agreement as well. CSW sent out Pt's information, provided family with bed options, and facilitated the dc to [facility].   Assessment/plan status:  No Further Intervention Required Other assessment/ plan:   Information/referral to community resources:    PATIENT'S/FAMILY'S RESPONSE TO PLAN OF CARE: Pt stated that she is motivated to get her legs stronger and is excited that her family from [place] is coming in town to see her.  Pt stated that she was depressed, but now is feeling motivated. Pt's son is supportive of Pt's goals.    Frederico Hamman, LCSW [phone]

## 2012-11-11 NOTE — Clinical Social Work Placement (Signed)
Clinical Social Work Department CLINICAL SOCIAL WORK PLACEMENT NOTE 11/11/2012  Patient:  [name], [name]  Account Number:  [id number] Admit date:  11/02/2012  Clinical Social Worker:  Frederico Hamman, LCSW  Date/time:  11/08/2012 12:00 M  Clinical Social Work is seeking post-discharge placement for this patient at the following level of care:      (*CSW will update this form in Epic as items are completed)   11/08/2012  Patient/family provided with [facility] Department of Clinical Social Work's list of facilities offering this level of care within the geographic area requested by the patient (or if unable, by the patient's family).  11/08/2012  Patient/family informed of their freedom to choose among providers that offer the needed level of care, that participate in Medicare, Medicaid or managed care program needed by the patient, have an available bed and are willing to accept the patient.  11/08/2012  Patient/family informed of [facility]' ownership interest in [facility], as well as of the fact that they are under no obligation to receive care at this facility.  PASARR submitted to [employer] on  PASARR number received from [employer] on   FL2 transmitted to all facilities in geographic area requested by pt/family on  11/08/2012 FL2 transmitted to all facilities within larger geographic area on   Patient informed that his/her managed care company has contracts with or will negotiate with  certain facilities, including the following:     Patient/family informed of bed offers received:  11/08/2012 Patient chooses bed at [facility] Physician recommends and patient chooses bed at    Patient to be transferred to [facility] on  11/08/2012 Patient to be transferred to facility by [facility]  The following physician request were entered in Epic:   Additional Comments: old pasarr on file.

## 2012-11-22 ENCOUNTER — Encounter (INDEPENDENT_AMBULATORY_CARE_PROVIDER_SITE_OTHER): Payer: Self-pay | Admitting: General Surgery

## 2013-01-24 ENCOUNTER — Emergency Department (HOSPITAL_COMMUNITY)
Admission: EM | Admit: 2013-01-24 | Discharge: 2013-01-24 | Disposition: A | Discharge status: Home / Self Care | Payer: Medicare Other | Attending: Emergency Medicine | Admitting: Emergency Medicine

## 2013-01-24 ENCOUNTER — Emergency Department (HOSPITAL_COMMUNITY): Payer: Medicare Other

## 2013-01-24 ENCOUNTER — Encounter (HOSPITAL_COMMUNITY): Payer: Self-pay | Admitting: Cardiology

## 2013-01-24 DIAGNOSIS — F29 Unspecified psychosis not due to a substance or known physiological condition: Secondary | ICD-10-CM | POA: Insufficient documentation

## 2013-01-24 DIAGNOSIS — R51 Headache: Secondary | ICD-10-CM

## 2013-01-24 DIAGNOSIS — Z87440 Personal history of urinary (tract) infections: Secondary | ICD-10-CM | POA: Insufficient documentation

## 2013-01-24 DIAGNOSIS — I10 Essential (primary) hypertension: Secondary | ICD-10-CM | POA: Insufficient documentation

## 2013-01-24 DIAGNOSIS — K219 Gastro-esophageal reflux disease without esophagitis: Secondary | ICD-10-CM | POA: Insufficient documentation

## 2013-01-24 DIAGNOSIS — Z8709 Personal history of other diseases of the respiratory system: Secondary | ICD-10-CM | POA: Insufficient documentation

## 2013-01-24 DIAGNOSIS — Z79899 Other long term (current) drug therapy: Secondary | ICD-10-CM | POA: Insufficient documentation

## 2013-01-24 DIAGNOSIS — R42 Dizziness and giddiness: Secondary | ICD-10-CM | POA: Insufficient documentation

## 2013-01-24 DIAGNOSIS — Z8739 Personal history of other diseases of the musculoskeletal system and connective tissue: Secondary | ICD-10-CM | POA: Insufficient documentation

## 2013-01-24 DIAGNOSIS — F17200 Nicotine dependence, unspecified, uncomplicated: Secondary | ICD-10-CM | POA: Insufficient documentation

## 2013-01-24 DIAGNOSIS — E785 Hyperlipidemia, unspecified: Secondary | ICD-10-CM | POA: Insufficient documentation

## 2013-01-24 DIAGNOSIS — R531 Weakness: Secondary | ICD-10-CM

## 2013-01-24 DIAGNOSIS — Z8673 Personal history of transient ischemic attack (TIA), and cerebral infarction without residual deficits: Secondary | ICD-10-CM | POA: Insufficient documentation

## 2013-01-24 DIAGNOSIS — R5381 Other malaise: Secondary | ICD-10-CM | POA: Insufficient documentation

## 2013-01-24 LAB — BASIC METABOLIC PANEL
Chloride: 99 mEq/L (ref 96–112)
GFR calc Af Amer: 48 mL/min — ABNORMAL LOW (ref >90)
GFR calc non Af Amer: 42 mL/min — ABNORMAL LOW (ref >90)
Potassium: 3.8 mEq/L (ref 3.5–5.1)
Sodium: 136 mEq/L (ref 135–145)

## 2013-01-24 LAB — URINE MICROSCOPIC-ADD ON

## 2013-01-24 LAB — CBC
HCT: 33.8 % — ABNORMAL LOW (ref 36.0–46.0)
MCHC: 31.7 g/dL (ref 30.0–36.0)
RDW: 17.2 % — ABNORMAL HIGH (ref 11.5–15.5)
WBC: 6.3 10*3/uL (ref 4.0–10.5)

## 2013-01-24 LAB — URINALYSIS, ROUTINE W REFLEX MICROSCOPIC
Bilirubin Urine: NEGATIVE
Hgb urine dipstick: NEGATIVE
Ketones, ur: NEGATIVE mg/dL
Nitrite: NEGATIVE
Specific Gravity, Urine: 1.016 (ref 1.005–1.030)
Urobilinogen, UA: 0.2 mg/dL (ref 0.0–1.0)
pH: 6 (ref 5.0–8.0)

## 2013-01-24 LAB — POCT I-STAT TROPONIN I: Troponin i, poc: 0.01 ng/mL (ref 0.00–0.08)

## 2013-01-24 NOTE — ED Notes (Signed)
Dr. Ignacia Palma at bedside speaking with pt.

## 2013-01-24 NOTE — ED Notes (Signed)
Pt recently been treated with PT for recent TIA's. Family reports today recently she has not been feeling well and when was got up out of the bed she was weak on her right side. Also recent headaches, states she has just not been acting like herself and had been slower to respond. Grip strengths equal at triage and pt able to lift both legs without any drift.

## 2013-01-24 NOTE — ED Notes (Signed)
Called CT, pt should be the next one.

## 2013-01-24 NOTE — ED Notes (Signed)
Son reports the pt fell last weekend in the bathroom but landed on bottom. Denies hitting head and loc.

## 2013-01-24 NOTE — ED Notes (Signed)
Pt placed on bedpan to try to get a urine sample. When taking patients brief off there was some pinkish-red colored stain on them. Pt denies having hemorrhoids or seeing blood in her stool.

## 2013-01-24 NOTE — ED Notes (Signed)
Pt ambulated with walker and 1 staff assist, pt had some issues getting up and when she would stop got a little unsteady. Pt's son sts this is almost back to her base line.

## 2013-01-24 NOTE — ED Provider Notes (Signed)
History     CSN: [id number]  Arrival date & time 01/24/13  1535   First MD Initiated Contact with Patient 01/24/13 1709      Chief Complaint  Patient presents with  . Headache  . Weakness    (Consider location/radiation/quality/duration/timing/severity/associated sxs/prior treatment) Patient is a 79 y.o. female presenting with headaches and weakness. The history is provided by the patient and a relative. No language interpreter was used.  Headache Pain location:  R temporal Quality:  Sharp Radiates to:  Does not radiate Severity currently:  0/10 Severity at highest:  Unable to specify Onset quality:  Sudden Duration:  2 days Timing:  Sporadic Progression:  Unchanged Chronicity:  New Relieved by:  None tried Worsened by:  Nothing tried Ineffective treatments:  None tried Associated symptoms: dizziness, loss of balance and weakness   Associated symptoms: no blurred vision, no cough, no fever, no photophobia, no syncope and no visual change   Weakness Associated symptoms include headaches and weakness. Pertinent negatives include no chest pain, coughing, fever or visual change.    Past Medical History  Diagnosis Date  . Hypertension   . Hyperlipidemia   . GERD (gastroesophageal reflux disease)   . Dysphagia     appt with Molly Maduro Buccini upcoming  . Chronic recurrent sinusitis   . Recurrent UTI   . Atrial fibrillation     ???  . Arthritis   . TIA (transient ischemic attack)     Past Surgical History  Procedure Laterality Date  . Pacemaker insertion    . Back surgery  1985  . Shoulder surgery  2008    Left  (Dr. Ranell Patrick)  . Total knee arthroplasty  2000, 2001    bilateral (Dr. Despina Hick), left 2000, right 2001  . Cataract extraction  1986, 2003    left 1986, right 2003  . Vaginal hysterectomy  1969  . Bilateral salpingoophorectomy  1971  . Tonsillectomy  1951  . Appendectomy  1952  . Mastoidectomy  1970  . Sinus exploration  1972  . Knee arthroscopy  1988,  1995, 2000  . Hemorrhoid surgery  2009  . Repair / reconstruction interphalangeal joint  2004    R thumb  . Cystoscopy  1992  . Insert / replace / remove pacemaker      History reviewed. No pertinent family history.  History  Substance Use Topics  . Smoking status: Former Smoker -- 1.00 packs/day for 15 years    Types: Cigarettes    Quit date: 12/04/1981  . Smokeless tobacco: Not on file  . Alcohol Use: No    OB History   Grav Para Term Preterm Abortions TAB SAB Ect Mult Living                  Review of Systems  Constitutional: Negative for fever.  Eyes: Negative for blurred vision and photophobia.  Respiratory: Negative for cough and shortness of breath.   Cardiovascular: Negative for chest pain and syncope.  Neurological: Positive for dizziness, weakness, headaches and loss of balance.  Psychiatric/Behavioral: Positive for confusion.  All other systems reviewed and are negative.    Allergies  Review of patient's allergies indicates no known allergies.  Home Medications   Current Outpatient Rx  Name  Route  Sig  Dispense  Refill  . allopurinol (ZYLOPRIM) 100 MG tablet   Oral   Take 100 mg by mouth 2 (two) times daily.         . calcitRIOL (ROCALTROL) 0.25 MCG capsule  Oral   Take 0.25 mcg by mouth daily.         . clopidogrel (PLAVIX) 75 MG tablet   Oral   Take 75 mg by mouth daily with breakfast.         . CRANBERRY PO   Oral   Take 2 capsules by mouth 2 (two) times daily.         Marland Kitchen dextromethorphan-guaiFENesin (MUCINEX DM) 30-600 MG per 12 hr tablet   Oral   Take 1 tablet by mouth every 12 (twelve) hours.         . DULoxetine (CYMBALTA) 60 MG capsule   Oral   Take 60 mg by mouth daily.         Marland Kitchen ipratropium (ATROVENT) 0.06 % nasal spray   Nasal   Place 2 sprays into the nose every morning.         Marland Kitchen levothyroxine (SYNTHROID, LEVOTHROID) 25 MCG tablet   Oral   Take 25 mcg by mouth daily.         Marland Kitchen lisinopril  (PRINIVIL,ZESTRIL) 20 MG tablet   Oral   Take 20 mg by mouth daily.         . Multiple Vitamin (MULTIVITAMIN WITH MINERALS) TABS   Oral   Take 1 tablet by mouth daily.         Marland Kitchen omega-3 acid ethyl esters (LOVAZA) 1 G capsule   Oral   Take 2 g by mouth daily.         . pantoprazole (PROTONIX) 40 MG tablet   Oral   Take 40 mg by mouth 2 (two) times daily.         . potassium chloride (K-DUR) 10 MEQ tablet   Oral   Take 20 mEq by mouth 2 (two) times daily.         . simvastatin (ZOCOR) 40 MG tablet   Oral   Take 40 mg by mouth every evening.         . sotalol (BETAPACE) 120 MG tablet   Oral   Take 120 mg by mouth 2 (two) times daily.         Marland Kitchen tolterodine (DETROL LA) 4 MG 24 hr capsule   Oral   Take 4 mg by mouth at bedtime.           BP 172/77  Pulse 92  Temp(Src) 98.8 F (37.1 C)  Resp 8  SpO2 92%  Physical Exam  Nursing note and vitals reviewed. Constitutional: She appears well-developed and well-nourished.  HENT:  Head: Normocephalic and atraumatic.  Eyes: Conjunctivae are normal. Pupils are equal, round, and reactive to light.  Neck: Normal range of motion. Neck supple.  Cardiovascular: Normal rate and intact distal pulses.   Pulmonary/Chest: Effort normal and breath sounds normal.  Abdominal: Soft. Bowel sounds are normal. She exhibits no distension. There is no tenderness.  Musculoskeletal: Normal range of motion. She exhibits no edema and no tenderness.  Lymphadenopathy:    She has no cervical adenopathy.  Neurological: She is alert.  Patient confused to time.  Skin: Skin is warm and dry.  Psychiatric: She has a normal mood and affect. Her behavior is normal. Judgment and thought content normal.    ED Course  Procedures (including critical care time)  Labs Reviewed  CBC - Abnormal; Notable for the following:    RBC 3.82 (*)    Hemoglobin 10.7 (*)    HCT 33.8 (*)    RDW 17.2 (*)  All other components within normal limits   BASIC METABOLIC PANEL - Abnormal; Notable for the following:    Glucose, Bld 114 (*)    Creatinine, Ser 1.20 (*)    GFR calc non Af Amer 42 (*)    GFR calc Af Amer 48 (*)    All other components within normal limits  URINALYSIS, ROUTINE W REFLEX MICROSCOPIC  POCT I-STAT TROPONIN I   No results found.   No diagnosis found.  Patient with intermittent headache x 2 days.  Today, found to be confused, unable to ambulate d/t weakness, predominantly on right side.  Stroke screen negative.  Head CT pending.  Radiology and lab results reviewed.  Patient discussed with Dr. Ignacia Palma, who has discussed patient with Dr. Roseanne Reno (neurology).  Patient is now ambulatory with walker, at baseline according to son.  Plan to discharge home with PCP follow-up.  Return precautions discussed.   MDM          Jimmye Norman, NP 01/25/13 [phone]

## 2013-01-24 NOTE — ED Notes (Signed)
Pt c/o mild HA that started 2 days ago. sts she has chronic back pain.

## 2013-01-24 NOTE — ED Notes (Signed)
Pt's son reports he doesn't think she took her BP medications this morning.

## 2013-01-24 NOTE — ED Notes (Signed)
Pt unable to give urine sample at this time. Taken off bedpan.

## 2013-01-24 NOTE — ED Notes (Signed)
Per family - pt has been going to PT. Pt ususally walks with a walker but today was unable to do so. The PT came and said she noticed that the pt was weaker than normal, pt right leg is normally stronger but today its weaker. Pt c/o HA x 2 days. Pt's son sts he had to carry her to the car. Pt has started coughing X 2 days, reports brown colored mucus. Pt in nad, skin warm and dry, resp e/u. Pt was here for pneumonia after thanksgiving.

## 2013-01-24 NOTE — ED Notes (Signed)
Pt unable to void at this time. 

## 2013-01-24 NOTE — ED Notes (Signed)
Pt returned from CT °

## 2013-01-24 NOTE — Progress Notes (Signed)
8:48 PM Pt had weakness, mainly in right leg, could not walk, with right leg collapsing on her.  She has a prior history of TIA.  She had been hospitalized for pneumonia and had been in a rehab facility, finally going home in early January.  She has in home physical therapy.  The physical therapist noted her inability to walk today and recommended ED evaluation.  Exam shows her able to move both legs.  She moves her arms well.  There is no facial weakness or slurred speech.  CT of head shows atrophy, no acute finding.  Case discussed with Dr. Roseanne Reno, neurologist, who advised trial of walking with a walker.   Pt had trial of walking using a walker, seemed to do well.  She seems to have returned to her baseline level of function.  Released.

## 2013-01-25 NOTE — ED Provider Notes (Signed)
Medical screening examination/treatment/procedure(s) were conducted as a shared visit with non-physician practitioner(s) and myself.  I personally evaluated the patient during the encounter Pt had weakness, mainly in right leg, could not walk, with right leg collapsing on her.  She has a prior history of TIA.  She had been hospitalized for pneumonia and had been in a rehab facility, finally going home in early January.  She has in home physical therapy.  The physical therapist noted her inability to walk today and recommended ED evaluation.  Exam shows her able to move both legs.  She moves her arms well.  There is no facial weakness or slurred speech.  CT of head shows atrophy, no acute finding.  Case discussed with Dr. Roseanne Reno, neurologist, who advised trial of walking with a walker.   Pt had trial of walking using a walker, seemed to do well.  She seems to have returned to her baseline level of function.  Released.   Carleene Cooper III, MD 01/25/13 1159

## 2013-02-20 ENCOUNTER — Encounter (HOSPITAL_COMMUNITY): Payer: Self-pay | Admitting: *Deleted

## 2013-02-20 ENCOUNTER — Inpatient Hospital Stay (HOSPITAL_COMMUNITY): Payer: Medicare Other

## 2013-02-20 ENCOUNTER — Emergency Department (HOSPITAL_COMMUNITY): Payer: Medicare Other

## 2013-02-20 ENCOUNTER — Inpatient Hospital Stay (HOSPITAL_COMMUNITY)
Admission: EM | Admit: 2013-02-20 | Discharge: 2013-02-25 | DRG: 062 | Disposition: A | Discharge status: Rehabilitation Facility | Payer: Medicare Other | Attending: Neurology | Admitting: Neurology

## 2013-02-20 DIAGNOSIS — E785 Hyperlipidemia, unspecified: Secondary | ICD-10-CM | POA: Diagnosis present

## 2013-02-20 DIAGNOSIS — R1310 Dysphagia, unspecified: Secondary | ICD-10-CM | POA: Diagnosis present

## 2013-02-20 DIAGNOSIS — I495 Sick sinus syndrome: Secondary | ICD-10-CM | POA: Diagnosis present

## 2013-02-20 DIAGNOSIS — N390 Urinary tract infection, site not specified: Secondary | ICD-10-CM | POA: Diagnosis present

## 2013-02-20 DIAGNOSIS — E039 Hypothyroidism, unspecified: Secondary | ICD-10-CM | POA: Diagnosis present

## 2013-02-20 DIAGNOSIS — G459 Transient cerebral ischemic attack, unspecified: Secondary | ICD-10-CM

## 2013-02-20 DIAGNOSIS — R4182 Altered mental status, unspecified: Secondary | ICD-10-CM

## 2013-02-20 DIAGNOSIS — Z96659 Presence of unspecified artificial knee joint: Secondary | ICD-10-CM

## 2013-02-20 DIAGNOSIS — Z8673 Personal history of transient ischemic attack (TIA), and cerebral infarction without residual deficits: Secondary | ICD-10-CM

## 2013-02-20 DIAGNOSIS — K219 Gastro-esophageal reflux disease without esophagitis: Secondary | ICD-10-CM | POA: Diagnosis present

## 2013-02-20 DIAGNOSIS — Z950 Presence of cardiac pacemaker: Secondary | ICD-10-CM

## 2013-02-20 DIAGNOSIS — I6340 Cerebral infarction due to embolism of unspecified cerebral artery: Principal | ICD-10-CM | POA: Diagnosis present

## 2013-02-20 DIAGNOSIS — R4701 Aphasia: Secondary | ICD-10-CM | POA: Diagnosis present

## 2013-02-20 DIAGNOSIS — D649 Anemia, unspecified: Secondary | ICD-10-CM

## 2013-02-20 DIAGNOSIS — I4891 Unspecified atrial fibrillation: Secondary | ICD-10-CM

## 2013-02-20 DIAGNOSIS — I10 Essential (primary) hypertension: Secondary | ICD-10-CM | POA: Diagnosis present

## 2013-02-20 DIAGNOSIS — Z87891 Personal history of nicotine dependence: Secondary | ICD-10-CM

## 2013-02-20 DIAGNOSIS — J320 Chronic maxillary sinusitis: Secondary | ICD-10-CM | POA: Diagnosis present

## 2013-02-20 DIAGNOSIS — I639 Cerebral infarction, unspecified: Secondary | ICD-10-CM

## 2013-02-20 LAB — CBC
Hemoglobin: 10.2 g/dL — ABNORMAL LOW (ref 12.0–15.0)
MCH: 27.9 pg (ref 26.0–34.0)
MCHC: 31.7 g/dL (ref 30.0–36.0)
Platelets: 202 10*3/uL (ref 150–400)
RDW: 18.1 % — ABNORMAL HIGH (ref 11.5–15.5)

## 2013-02-20 LAB — COMPREHENSIVE METABOLIC PANEL
ALT: 8 U/L (ref 0–35)
AST: 18 U/L (ref 0–37)
Albumin: 3.2 g/dL — ABNORMAL LOW (ref 3.5–5.2)
Alkaline Phosphatase: 76 U/L (ref 39–117)
BUN: 15 mg/dL (ref 6–23)
Chloride: 103 mEq/L (ref 96–112)
Potassium: 3.7 mEq/L (ref 3.5–5.1)
Sodium: 139 mEq/L (ref 135–145)
Total Bilirubin: 0.2 mg/dL — ABNORMAL LOW (ref 0.3–1.2)
Total Protein: 6.4 g/dL (ref 6.0–8.3)

## 2013-02-20 LAB — TROPONIN I: Troponin I: <0.3 ng/mL (ref <0.30)

## 2013-02-20 LAB — DIFFERENTIAL
Basophils Absolute: 0.1 10*3/uL (ref 0.0–0.1)
Basophils Relative: 1 % (ref 0–1)
Eosinophils Absolute: 0.5 10*3/uL (ref 0.0–0.7)
Neutro Abs: 2.3 10*3/uL (ref 1.7–7.7)
Neutrophils Relative %: 41 % — ABNORMAL LOW (ref 43–77)

## 2013-02-20 LAB — POCT I-STAT, CHEM 8
BUN: 16 mg/dL (ref 6–23)
Calcium, Ion: 1.24 mmol/L (ref 1.13–1.30)
Chloride: 105 mEq/L (ref 96–112)
Creatinine, Ser: 1.2 mg/dL — ABNORMAL HIGH (ref 0.50–1.10)
Glucose, Bld: 92 mg/dL (ref 70–99)
HCT: 33 % — ABNORMAL LOW (ref 36.0–46.0)

## 2013-02-20 LAB — PROTIME-INR
INR: 0.91 (ref 0.00–1.49)
Prothrombin Time: 12.2 seconds (ref 11.6–15.2)

## 2013-02-20 LAB — MRSA PCR SCREENING: MRSA by PCR: NEGATIVE

## 2013-02-20 LAB — APTT: aPTT: 22 seconds — ABNORMAL LOW (ref 24–37)

## 2013-02-20 MED ORDER — ONDANSETRON HCL 4 MG/2ML IJ SOLN
4.0000 mg | Freq: Four times a day (QID) | INTRAMUSCULAR | Status: DC | PRN
  Administered 2013-02-21 – 2013-02-25 (×4): 4 mg via INTRAVENOUS
  Filled 2013-02-20 (×4): qty 2

## 2013-02-20 MED ORDER — PANTOPRAZOLE SODIUM 40 MG IV SOLR
40.0000 mg | Freq: Every day | INTRAVENOUS | Status: DC
  Administered 2013-02-20 – 2013-02-21 (×2): 40 mg via INTRAVENOUS
  Filled 2013-02-20 (×3): qty 40

## 2013-02-20 MED ORDER — LABETALOL HCL 5 MG/ML IV SOLN
INTRAVENOUS | Status: AC
  Filled 2013-02-20: qty 4

## 2013-02-20 MED ORDER — ALTEPLASE (STROKE) FULL DOSE INFUSION
0.9000 mg/kg | Freq: Once | INTRAVENOUS | Status: AC
  Administered 2013-02-20: 65 mg via INTRAVENOUS
  Filled 2013-02-20: qty 65

## 2013-02-20 MED ORDER — ACETAMINOPHEN 650 MG RE SUPP: 650.0000 mg | RECTAL | Status: DC | PRN

## 2013-02-20 MED ORDER — SODIUM CHLORIDE 0.9 % IV SOLN
INTRAVENOUS | Status: DC
  Administered 2013-02-20: 17:00:00 via INTRAVENOUS

## 2013-02-20 MED ORDER — ACETAMINOPHEN 325 MG PO TABS: 650.0000 mg | ORAL_TABLET | ORAL | Status: DC | PRN

## 2013-02-20 MED ORDER — LABETALOL HCL 5 MG/ML IV SOLN: 10.0000 mg | INTRAVENOUS | Status: DC | PRN

## 2013-02-20 NOTE — ED Notes (Signed)
Per EMS- pt was at home with son. Son saw pt at 3pm adn she was normal. Pt then brought her lunch and was noted to have slurred speech and aphasia. Pt CBG was 78. 154/94. Hr 56.

## 2013-02-20 NOTE — ED Notes (Signed)
Pt LSN 1445.  Symptoms noticed at 1500.  Arrival 1607.  Stroke team arrived at 69 Arrival in ct 1612 Phlebotomist 1601 CT read 1626 Pharmacy notified for tPA 1626 tPA delived 1640 ICU bed requested 1650 tPA started at 1645

## 2013-02-20 NOTE — ED Notes (Signed)
Pt and family reports a headache that has been ongoing for 3 days. Pt states that headache is lessened today. Pt also states that she was not able to sleep last night.

## 2013-02-20 NOTE — ED Provider Notes (Signed)
History     CSN: [id number]  Arrival date & time 02/20/13  1610   First MD Initiated Contact with Patient 02/20/13 1617      Chief Complaint  Patient presents with  . Code Stroke     The history is provided by a relative and the EMS personnel. The history is limited by the condition of the patient (Aphasia).  Pt was seen at 1620.  Per EMS and pt family report, pt home with her son today PTA.  He states he saw pt approx 1445/1500 and "she was ok."  States he brought her lunch shortly after that and she had slurred speech.  EMS called Code Stroke en route to ED.    Past Medical History  Diagnosis Date  . Hypertension   . Hyperlipidemia   . GERD (gastroesophageal reflux disease)   . Dysphagia     appt with Molly Maduro Buccini upcoming  . Chronic recurrent sinusitis   . Recurrent UTI   . Atrial fibrillation     ???  . Arthritis   . TIA (transient ischemic attack)     Past Surgical History  Procedure Laterality Date  . Pacemaker insertion    . Back surgery  1985  . Shoulder surgery  2008    Left  (Dr. Ranell Patrick)  . Total knee arthroplasty  2000, 2001    bilateral (Dr. Despina Hick), left 2000, right 2001  . Cataract extraction  1986, 2003    left 1986, right 2003  . Vaginal hysterectomy  1969  . Bilateral salpingoophorectomy  1971  . Tonsillectomy  1951  . Appendectomy  1952  . Mastoidectomy  1970  . Sinus exploration  1972  . Knee arthroscopy  1988, 1995, 2000  . Hemorrhoid surgery  2009  . Repair / reconstruction interphalangeal joint  2004    R thumb  . Cystoscopy  1992  . Insert / replace / remove pacemaker      History  Substance Use Topics  . Smoking status: Former Smoker -- 1.00 packs/day for 15 years    Types: Cigarettes    Quit date: 12/04/1981  . Smokeless tobacco: Not on file  . Alcohol Use: No     Review of Systems  Unable to perform ROS: Acuity of condition       Allergies  Review of patient's allergies indicates no known allergies.  Home  Medications   Current Outpatient Rx  Name  Route  Sig  Dispense  Refill  . allopurinol (ZYLOPRIM) 100 MG tablet   Oral   Take 100 mg by mouth 2 (two) times daily.         . calcitRIOL (ROCALTROL) 0.25 MCG capsule   Oral   Take 0.25 mcg by mouth daily.         . clopidogrel (PLAVIX) 75 MG tablet   Oral   Take 75 mg by mouth daily with breakfast.         . CRANBERRY PO   Oral   Take 2 capsules by mouth 2 (two) times daily.         Marland Kitchen dextromethorphan-guaiFENesin (MUCINEX DM) 30-600 MG per 12 hr tablet   Oral   Take 1 tablet by mouth every 12 (twelve) hours.         . DULoxetine (CYMBALTA) 60 MG capsule   Oral   Take 60 mg by mouth daily.         Marland Kitchen ipratropium (ATROVENT) 0.06 % nasal spray   Nasal   Place  2 sprays into the nose every morning.         Marland Kitchen levothyroxine (SYNTHROID, LEVOTHROID) 25 MCG tablet   Oral   Take 25 mcg by mouth daily.         Marland Kitchen lisinopril (PRINIVIL,ZESTRIL) 20 MG tablet   Oral   Take 20 mg by mouth daily.         . Multiple Vitamin (MULTIVITAMIN WITH MINERALS) TABS   Oral   Take 1 tablet by mouth daily.         Marland Kitchen omega-3 acid ethyl esters (LOVAZA) 1 G capsule   Oral   Take 2 g by mouth daily.         . pantoprazole (PROTONIX) 40 MG tablet   Oral   Take 40 mg by mouth 2 (two) times daily.         . potassium chloride (K-DUR) 10 MEQ tablet   Oral   Take 20 mEq by mouth 2 (two) times daily.         . simvastatin (ZOCOR) 40 MG tablet   Oral   Take 40 mg by mouth every evening.         . sotalol (BETAPACE) 120 MG tablet   Oral   Take 120 mg by mouth 2 (two) times daily.         Marland Kitchen tolterodine (DETROL LA) 4 MG 24 hr capsule   Oral   Take 4 mg by mouth at bedtime.           BP 181/66  Pulse 57  Temp(Src) 97.6 F (36.4 C)  Resp 20  Wt 158 lb 11.7 oz (72 kg)  BMI 26.41 kg/m2  SpO2 100%  Physical Exam Physical examination:  Nursing notes reviewed; Vital signs and O2 SAT reviewed;  Constitutional:  Well developed, Well nourished, Well hydrated, In no acute distress; Head:  Normocephalic, atraumatic; Eyes: EOMI, PERRL, No scleral icterus; ENMT: Mouth and pharynx normal, Mucous membranes moist; Neck: Supple, Full range of motion, No lymphadenopathy; Cardiovascular: Regular rate and rhythm, No gallop; Respiratory: Breath sounds clear & equal bilaterally, No wheezes.  Speaking full sentences with ease, Normal respiratory effort/excursion; Chest: Nontender, Movement normal; Abdomen: Soft, Nontender, Nondistended, Normal bowel sounds;; Extremities: Pulses normal, No tenderness, No edema, No calf edema or asymmetry.; Neuro: Awake, alert. Will start to answer questions appropriately, then answers become nonsensical and garbled.  Major CN grossly intact. No facial droop. Speech clear. Grips equal.  Strength 5/5 equal bilat UE's and LE's.  No gross focal motor or sensory deficits in extremities.; Skin: Color normal, Warm, Dry.    ED Course  Procedures   1630:  Code Stroke called by EMS en route for aphasia.  CT head negative for acute process. Neuro MD at bedside talking with family re: TPA.   1700:  Per Neuro MD:  IV labetolol started for BP control, IV TPA started for CVA.  Neuro MD states Stroke service will admit.   MDM  MDM Reviewed: previous chart, nursing note and vitals Reviewed previous: ECG Interpretation: labs, ECG, x-ray and CT scan Total time providing critical care: 30-74 minutes. This excludes time spent performing separately reportable procedures and services. Consults: neurology    CRITICAL CARE Performed by: Laray Anger Total critical care time: 35 Critical care time was exclusive of separately billable procedures and treating other patients. Critical care was necessary to treat or prevent imminent or life-threatening deterioration. Critical care was time spent personally by me on the following activities: development of  treatment plan with patient and/or surrogate as  well as nursing, discussions with consultants, evaluation of patient's response to treatment, examination of patient, obtaining history from patient or surrogate, ordering and performing treatments and interventions, ordering and review of laboratory studies, ordering and review of radiographic studies, pulse oximetry and re-evaluation of patient's condition.    Date: 02/20/2013  Rate: 63  Rhythm: normal sinus rhythm, artifact  QRS Axis: left  Intervals: normal  ST/T Wave abnormalities: normal  Conduction Disutrbances:none  Narrative Interpretation:   Old EKG Reviewed: changes noted; was atrial paced rhythm on previous EKG dated 01/24/2013.  Results for orders placed during the hospital encounter of 02/20/13  PROTIME-INR      Result Value Range   Prothrombin Time 12.2  11.6 - 15.2 seconds   INR 0.91  0.00 - 1.49  APTT      Result Value Range   aPTT 22 (*) 24 - 37 seconds  CBC      Result Value Range   WBC 5.6  4.0 - 10.5 K/uL   RBC 3.66 (*) 3.87 - 5.11 MIL/uL   Hemoglobin 10.2 (*) 12.0 - 15.0 g/dL   HCT 16.1 (*) 09.6 - 04.5 %   MCV 88.0  78.0 - 100.0 fL   MCH 27.9  26.0 - 34.0 pg   MCHC 31.7  30.0 - 36.0 g/dL   RDW 40.9 (*) 81.1 - 91.4 %   Platelets 202  150 - 400 K/uL  DIFFERENTIAL      Result Value Range   Neutrophils Relative 41 (*) 43 - 77 %   Neutro Abs 2.3  1.7 - 7.7 K/uL   Lymphocytes Relative 38  12 - 46 %   Lymphs Abs 2.1  0.7 - 4.0 K/uL   Monocytes Relative 12  3 - 12 %   Monocytes Absolute 0.7  0.1 - 1.0 K/uL   Eosinophils Relative 9 (*) 0 - 5 %   Eosinophils Absolute 0.5  0.0 - 0.7 K/uL   Basophils Relative 1  0 - 1 %   Basophils Absolute 0.1  0.0 - 0.1 K/uL  GLUCOSE, CAPILLARY      Result Value Range   Glucose-Capillary 78  70 - 99 mg/dL  POCT I-STAT, CHEM 8      Result Value Range   Sodium 140  135 - 145 mEq/L   Potassium 3.7  3.5 - 5.1 mEq/L   Chloride 105  96 - 112 mEq/L   BUN 16  6 - 23 mg/dL   Creatinine, Ser 7.82 (*) 0.50 - 1.10 mg/dL   Glucose,  Bld 92  70 - 99 mg/dL   Calcium, Ion 9.56  2.13 - 1.30 mmol/L   TCO2 29  0 - 100 mmol/L   Hemoglobin 11.2 (*) 12.0 - 15.0 g/dL   HCT 08.6 (*) 57.8 - 46.9 %  POCT I-STAT TROPONIN I      Result Value Range   Troponin i, poc 0.02  0.00 - 0.08 ng/mL   Comment 3            Ct Head Wo Contrast 02/20/2013  *RADIOLOGY REPORT*  Clinical Data: Code stroke.  Slurred speech and difficult to communicating.  CT HEAD WITHOUT CONTRAST  Technique:  Contiguous axial images were obtained from the base of the skull through the vertex without contrast.  Comparison: Head CT 01/24/2013 and multiple priors dating back to the 06/11/2005  Findings: Stable marked cerebral atrophy with associated ventriculomegaly and moderate periventricular chronic white matter ischemic  changes.  Negative for hemorrhage, mass effect, midline shift, mass lesion, or evidence of acute cortically based infarction.  There is chronic mucosal thickening and along the visualized aspect of the right maxillary sinus, with suspected postsurgical changes of the right maxillary sinus, partially visualized.  There is fluid within some of the right ethmoid air cells.  No chronic right mastoid effusion and postsurgical changes of the right mastoids are noted.  The skull is intact.  IMPRESSION: 1.  No acute intracranial abnormality.  2.  Stable cerebral atrophy, ventriculomegaly and periventricular white matter disease. 3.  Chronic right maxillary sinus disease, right ethmoid disease, and chronic right mastoid effusion.   Original Report Authenticated By: Britta Mccreedy, M.D.                   Laray Anger, DO 02/23/13 0129

## 2013-02-20 NOTE — H&P (Addendum)
Reason for Admission: Stroke  CC: difficulty speaking.   History is obtained from:Patient's son  HPI: [name] is a 79 y.o. female who was in her normal state at 2:45 pm, then was seen by her son to not be able to speak around 3:15 pm. This has happened two times before, but both were transient.    LKW: 2:45 pm tpa given: yes    ROS: A 14 point ROS was unable to be obtained due to aphasia   Past Medical History  Diagnosis Date  . Hypertension   . Hyperlipidemia   . GERD (gastroesophageal reflux disease)   . Dysphagia     appt with Molly Maduro Buccini upcoming  . Chronic recurrent sinusitis   . Recurrent UTI   . Atrial fibrillation     ???  . Arthritis   . TIA (transient ischemic attack)      Exam: Current vital signs: BP 181/66  Pulse 57  Temp(Src) 97.6 F (36.4 C)  Resp 20  Wt 72 kg (158 lb 11.7 oz)  BMI 26.41 kg/m2  SpO2 100% Vital signs in last 24 hours: Temp:  [97.6 F (36.4 C)-98 F (36.7 C)] 97.6 F (36.4 C) (03/20 1645) Pulse Rate:  [55-62] 57 (03/20 1649) Resp:  [14-20] 20 (03/20 1649) BP: (181-185)/(60-66) 181/66 mmHg (03/20 1649) SpO2:  [99 %-100 %] 100 % (03/20 1649) Weight:  [72 kg (158 lb 11.7 oz)] 72 kg (158 lb 11.7 oz) (03/20 1500)  General: in bed, nad CV: rrr, paced on m onitor Mental Status: Patient is awake, alert, unable to give month or age Expressive and receptive componennts to her aphasia Cranial Nerves: II: Visual Fields are full. Pupils are equal, round, and reactive to light.  Discs are difficult to visulaize. III,IV, VI: EOMI without ptosis or diploplia.  V: Facial sensation is symmetric to temperature VII: Facial movement is symmetric.  VIII: hearing is intact to voice X: Uvula elevates symmetrically XI: Shoulder shrug is symmetric. XII: tongue is midline without atrophy or fasciculations.  Motor: Tone is normal. Bulk is normal. 5/5 strength was present in all four extremities.  Sensory: Sensation is symmetric to  pin, though not clear if full understanding of question Deep Tendon Reflexes: 2+ and symmetric in the biceps and patellae.  Plantars: Toes are downgoing bilaterally.  Cerebellar: FNF difficult to explain, but no clear dysmetria Gait: Not assessed due to acute nature of evaluation and multiple medical monitors in ED setting.  I have reviewed labs in epic and the results pertinent to this consultation are: nml INR, plts  I have reviewed the images obtained:CT head - negative  Impression: 79 yo F with possible history of afib by chart with new onset aphasia likely due to ischemic stroke. She has received IV tPA  Recommendations: 1. HgbA1c, fasting lipid panel 2. MRI, MRA  of the brain without contrast 3. Frequent neuro checks 4. Echocardiogram 5. Carotid dopplers 6. Prophylactic therapy-None, held due to tpa, asa after 24 hours 7. Risk factor modification 8. Telemetry monitoring 9. PT consult, OT consult, Speech consult  Continue home synthroid, cymbalta, simvastati REsume home betapace tomorrow.  This patient is critically ill and at significant risk of neurological worsening, death and care requires constant monitoring of vital signs, hemodynamics,respiratory and cardiac monitoring, neurological assessment, discussion with family, other specialists and medical decision making of high complexity. I spent 60 minutes of neurocritical care time  in the care of  this patient.  Ritta Slot, MD [facility] [phone]  If 7pm- 7am, please page neurology on call at [phone].

## 2013-02-21 ENCOUNTER — Encounter (HOSPITAL_COMMUNITY): Payer: Self-pay | Admitting: Nurse Practitioner

## 2013-02-21 ENCOUNTER — Inpatient Hospital Stay (HOSPITAL_COMMUNITY): Payer: Medicare Other

## 2013-02-21 DIAGNOSIS — I6350 Cerebral infarction due to unspecified occlusion or stenosis of unspecified cerebral artery: Secondary | ICD-10-CM

## 2013-02-21 LAB — RAPID URINE DRUG SCREEN, HOSP PERFORMED
Amphetamines: NOT DETECTED
Benzodiazepines: POSITIVE — AB
Tetrahydrocannabinol: NOT DETECTED

## 2013-02-21 LAB — URINALYSIS, ROUTINE W REFLEX MICROSCOPIC
Glucose, UA: NEGATIVE mg/dL
Ketones, ur: NEGATIVE mg/dL
Protein, ur: 100 mg/dL — AB
Urobilinogen, UA: 0.2 mg/dL (ref 0.0–1.0)

## 2013-02-21 LAB — LIPID PANEL
Cholesterol: 166 mg/dL (ref 0–200)
LDL Cholesterol: 85 mg/dL (ref 0–99)
Total CHOL/HDL Ratio: 4.7 RATIO
Triglycerides: 232 mg/dL — ABNORMAL HIGH (ref <150)
VLDL: 46 mg/dL — ABNORMAL HIGH (ref 0–40)

## 2013-02-21 LAB — URINE MICROSCOPIC-ADD ON

## 2013-02-21 MED ORDER — SOTALOL HCL 120 MG PO TABS
120.0000 mg | ORAL_TABLET | Freq: Two times a day (BID) | ORAL | Status: DC
  Administered 2013-02-21 – 2013-02-22 (×3): 120 mg via ORAL
  Filled 2013-02-21 (×4): qty 1

## 2013-02-21 MED ORDER — MORPHINE SULFATE 2 MG/ML IJ SOLN
1.0000 mg | Freq: Once | INTRAMUSCULAR | Status: DC
  Filled 2013-02-21: qty 1

## 2013-02-21 MED ORDER — SIMVASTATIN 40 MG PO TABS
40.0000 mg | ORAL_TABLET | Freq: Every day | ORAL | Status: DC
  Administered 2013-02-21: 40 mg via ORAL
  Filled 2013-02-21 (×2): qty 1

## 2013-02-21 MED ORDER — SULFAMETHOXAZOLE-TRIMETHOPRIM 400-80 MG PO TABS
1.0000 | ORAL_TABLET | Freq: Two times a day (BID) | ORAL | Status: DC
  Administered 2013-02-21 – 2013-02-25 (×9): 1 via ORAL
  Filled 2013-02-21 (×10): qty 1

## 2013-02-21 MED ORDER — ASPIRIN EC 81 MG PO TBEC
81.0000 mg | DELAYED_RELEASE_TABLET | Freq: Every day | ORAL | Status: DC
  Administered 2013-02-21 – 2013-02-22 (×2): 81 mg via ORAL
  Filled 2013-02-21 (×3): qty 1

## 2013-02-21 MED ORDER — DULOXETINE HCL 60 MG PO CPEP
60.0000 mg | ORAL_CAPSULE | Freq: Every day | ORAL | Status: DC
  Administered 2013-02-21 – 2013-02-22 (×2): 60 mg via ORAL
  Filled 2013-02-21 (×2): qty 1

## 2013-02-21 MED ORDER — LEVOTHYROXINE SODIUM 25 MCG PO TABS
25.0000 ug | ORAL_TABLET | Freq: Every day | ORAL | Status: DC
  Administered 2013-02-22: 25 ug via ORAL
  Filled 2013-02-21 (×3): qty 1

## 2013-02-21 MED ORDER — ASPIRIN EC 325 MG PO TBEC: 325.0000 mg | DELAYED_RELEASE_TABLET | Freq: Every day | ORAL | Status: DC

## 2013-02-21 NOTE — Progress Notes (Signed)
UR completed 

## 2013-02-21 NOTE — Evaluation (Signed)
Physical Therapy Evaluation Patient Details Name: [name] MRN: [id number] DOB: [birth year] Today's Date: 02/21/2013 Time: 1000-1031 PT Time Calculation (min): 31 min  PT Assessment / Plan / Recommendation Clinical Impression  79 y.o. female admitted to [facility] with suspected L brain stroke (imaging pending) who recieved tPA in the ED.  She presents today with generalized weakness and imbalance.  She leans posteriorly and lists to the right of the RW during gait.  At baseline it sounds like she uses RW and has balance and fall history.  She was active with HHPT PTA.  I will need to talk with her son to see how he would like to proceede, but he did not answer his cell phone.  Inpatient rehab would be beneficial to her and would make her less at risk for falls, but she did not seem very open to the idea.  Maybe if her son was on board and encouraging this plan she would agree.      PT Assessment  Patient needs continued PT services    Follow Up Recommendations  CIR    Does the patient have the potential to tolerate intense rehabilitation     yes  Barriers to Discharge None None    Equipment Recommendations  None recommended by PT    Recommendations for Other Services Rehab consult   Frequency Min 4X/week    Precautions / Restrictions Precautions Precautions: Fall Precaution Comments: h/o recurrent falls at home   Pertinent Vitals/Pain VSS, O2 98 on RA     Mobility  Bed Mobility Bed Mobility: Rolling Left;Left Sidelying to Sit;Sitting - Scoot to [place] of Bed Rolling Left: 4: Min assist;With rail Left Sidelying to Sit: 4: Min assist;With rails;HOB flat Sitting - Scoot to [place] of Bed: 4: Min assist;With rail Details for Bed Mobility Assistance: min assist to help pt find railing and to support trunk during transition to sitting.  Min assist to scoot so that pt would not LOB posteriorly until her feet were supported on ground.   Transfers Transfers: Sit to Stand;Stand to  Sit Sit to Stand: From elevated surface;With upper extremity assist;3: Mod assist Stand to Sit: 4: Min assist;To chair/3-in-1 Details for Transfer Assistance: mod assist to stand to support trunk and anteriorly weight shift body over feet (pt tends to put weight trhough her heels and lean back), min assist with uncontrolled descent to sit.  Ambulation/Gait Ambulation/Gait Assistance: 3: Mod assist Ambulation Distance (Feet): 10 Feet Assistive device: Rolling walker Ambulation/Gait Assistance Details: mod assist to support trunk for balance, again even with forward gait she tends to lean backwards and lists to the right, kicking the right leg of RW.   Gait Pattern: Step-through pattern;Shuffle;Trunk flexed;Narrow base of support (listing to the right side of the RW) Gait velocity: less than 1.8 ft/sec which indicats risk for recurrent falls Modified Rankin (Stroke Patients Only) Pre-Morbid Rankin Score: Moderately severe disability Modified Rankin: Moderately severe disability        PT Diagnosis: Difficulty walking;Abnormality of gait;Generalized weakness;Altered mental status  PT Problem List: Decreased strength;Decreased activity tolerance;Decreased mobility;Decreased balance;Decreased knowledge of use of DME;Decreased cognition;Decreased safety awareness;Pain PT Treatment Interventions: DME instruction;Gait training;Stair training;Functional mobility training;Therapeutic activities;Therapeutic exercise;Balance training;Neuromuscular re-education;Cognitive remediation;Patient/family education   PT Goals Acute Rehab PT Goals PT Goal Formulation: With patient Time For Goal Achievement: 03/07/13 Potential to Achieve Goals: Good Pt will go Supine/Side to Sit: with supervision PT Goal: Supine/Side to Sit - Progress: Goal set today Pt will go Sit to Supine/Side: with supervision  PT Goal: Sit to Supine/Side - Progress: Goal set today Pt will go Sit to Stand: with supervision PT Goal: Sit to  Stand - Progress: Goal set today Pt will go Stand to Sit: with supervision PT Goal: Stand to Sit - Progress: Goal set today Pt will Transfer Bed to Chair/Chair to Bed: with supervision PT Transfer Goal: Bed to Chair/Chair to Bed - Progress: Goal set today Pt will Ambulate: with supervision;51 - 150 feet;with rolling walker PT Goal: Ambulate - Progress: Goal set today Pt will Go Up / Down Stairs: Flight;with min assist;with rail(s) PT Goal: Up/Down Stairs - Progress: Goal set today  Visit Information  Last PT Received On: 02/21/13 Assistance Needed: +1    Subjective Data  Subjective: Pt reports h/o chronic low back pain.  States she feels a little weaker than normal.   Patient Stated Goal: to go home not to rehab   Prior Functioning  Home Living Lives With: Son Available Help at Discharge: Available PRN/intermittently (per pt son works from home) Type of Home: House Home Access: Stairs to enter Secretary/administrator of Steps: 4 Entrance Stairs-Rails: Right Home Layout: Two level Alternate Level Stairs-Number of Steps: 14 Alternate Level Stairs-Rails: Can reach both;Right;Left Home Adaptive Equipment: Walker - rolling Additional Comments: pt reports she has several RWs at home Prior Function Level of Independence: Needs assistance Comments: was recieving home PT before admission.   Communication Communication: Expressive difficulties    Cognition  Cognition Overall Cognitive Status: Impaired Area of Impairment: Attention;Awareness of errors Difficult to assess due to: Other (comment) (expressive difficulties) Arousal/Alertness: Awake/alert Orientation Level: Place;Situation (partially to time, could tell month not year) Behavior During Session: [facility] for tasks performed Current Attention Level: Sustained Attention - Other Comments: decreased sustained attention,  Awareness of Errors: Assistance required to identify errors made Awareness of Errors - Other Comments: needed  cues to stay inside of RW, was drifting to the right while walking- pt unaware that she was doing this.   Cognition - Other Comments: pt talking about one thing and within a minute switches to another unrelated topic.      Extremity/Trunk Assessment Right Lower Extremity Assessment RLE ROM/Strength/Tone: Deficits RLE ROM/Strength/Tone Deficits: grossly 4/5 per seated MMT, but no obvious asymmetries left vs right RLE Sensation: WFL - Light Touch Left Lower Extremity Assessment LLE ROM/Strength/Tone: Deficits LLE ROM/Strength/Tone Deficits: grossly 4/5 per seated MMT, but no obvious asymmetries left vs right LLE Sensation: WFL - Light Touch   Balance Static Sitting Balance Static Sitting - Balance Support: Bilateral upper extremity supported;Feet supported Static Sitting - Level of Assistance: 5: Stand by assistance Static Sitting - Comment/# of Minutes: with feet unsupported min asssit to prevent posterior LOB Static Standing Balance Static Standing - Balance Support: Bilateral upper extremity supported Static Standing - Level of Assistance: 4: Min assist Static Standing - Comment/# of Minutes: min assist once standing with RW.  Posterior lean in static standing.    End of Session PT - End of Session Equipment Utilized During Treatment: Gait belt Activity Tolerance: Patient limited by fatigue Patient left: in chair;with call bell/phone within reach;Other (comment) (with SLP in room)    Lurena Joiner B. Adreanne Yono, PT, DPT [phone]   02/21/2013, 10:48 AM

## 2013-02-21 NOTE — Consult Note (Signed)
Admit date: 02/20/2013 Referring Physician  Dr. Amada Jupiter Primary Physician  Dr. Tiburcio Pea Primary Cardiologist  Dr. Eldridge Dace Reason for Consultation  CVA with history of PAF  HPI: This is a 79yo WF with a history of PAF with tachybrady syndrome who had initially been on anticoagulation with coumadin years ago but due to unsteady gait the risk/benefit ratio was felt to be too high to continue and her coumadin was stopped.  She was noted on a pacer check in August 2013 to have 48 episodes of PAF that longest lasting about 48 minutes.  She now presents with an embolic CVA.  We are now asked to consult to give guidance on anticoagulation.  She deneis any palpitations, chest pain or SOB.  Of concern though is that she says she is very unsteady on her feet and falls several times a week going down to the ground and usually landing on her buttocks.     PMH:   Past Medical History  Diagnosis Date  . Hypertension   . Hyperlipidemia   . GERD (gastroesophageal reflux disease)   . Dysphagia     appt with Molly Maduro Buccini upcoming  . Chronic recurrent sinusitis   . Recurrent UTI   . Atrial fibrillation     ???  . Arthritis   . TIA (transient ischemic attack)      PSH:   Past Surgical History  Procedure Laterality Date  . Pacemaker insertion    . Back surgery  1985  . Shoulder surgery  2008    Left  (Dr. Ranell Patrick)  . Total knee arthroplasty  2000, 2001    bilateral (Dr. Despina Hick), left 2000, right 2001  . Cataract extraction  1986, 2003    left 1986, right 2003  . Vaginal hysterectomy  1969  . Bilateral salpingoophorectomy  1971  . Tonsillectomy  1951  . Appendectomy  1952  . Mastoidectomy  1970  . Sinus exploration  1972  . Knee arthroscopy  1988, 1995, 2000  . Hemorrhoid surgery  2009  . Repair / reconstruction interphalangeal joint  2004    R thumb  . Cystoscopy  1992  . Insert / replace / remove pacemaker      Allergies:  Review of patient's allergies indicates no known  allergies. Prior to Admit Meds:   Prescriptions prior to admission  Medication Sig Dispense Refill  . acetaminophen (TYLENOL) 325 MG tablet Take 325 mg by mouth every 6 (six) hours as needed for pain.      Marland Kitchen allopurinol (ZYLOPRIM) 100 MG tablet Take 100 mg by mouth 2 (two) times daily.      . butalbital-aspirin-caffeine (FIORINAL) 50-325-40 MG per capsule Take 1 capsule by mouth 2 (two) times daily as needed for pain or headache.      . calcitRIOL (ROCALTROL) 0.25 MCG capsule Take 0.25 mcg by mouth every morning.       . clopidogrel (PLAVIX) 75 MG tablet Take 75 mg by mouth daily with breakfast.      . CRANBERRY PO Take 2 capsules by mouth 2 (two) times daily.      Marland Kitchen dextromethorphan-guaiFENesin (MUCINEX DM) 30-600 MG per 12 hr tablet Take 1 tablet by mouth every 12 (twelve) hours.      . DULoxetine (CYMBALTA) 60 MG capsule Take 60 mg by mouth every morning.       Marland Kitchen ipratropium (ATROVENT) 0.06 % nasal spray Place 2 sprays into the nose every morning.      Marland Kitchen levothyroxine (SYNTHROID, LEVOTHROID) 25 MCG  tablet Take 25 mcg by mouth every morning.       Marland Kitchen lisinopril (PRINIVIL,ZESTRIL) 20 MG tablet Take 20 mg by mouth daily.      . Multiple Vitamin (MULTIVITAMIN WITH MINERALS) TABS Take 1 tablet by mouth every morning.       Marland Kitchen omega-3 acid ethyl esters (LOVAZA) 1 G capsule Take 1 g by mouth every morning.       . pantoprazole (PROTONIX) 40 MG tablet Take 40 mg by mouth 2 (two) times daily.      . potassium chloride (K-DUR) 10 MEQ tablet Take 20 mEq by mouth 2 (two) times daily.      . simvastatin (ZOCOR) 40 MG tablet Take 40 mg by mouth every evening.      . sotalol (BETAPACE) 120 MG tablet Take 120 mg by mouth 2 (two) times daily.      Marland Kitchen tolterodine (DETROL LA) 4 MG 24 hr capsule Take 4 mg by mouth at bedtime.       Fam HX:   No family history on file. Social HX:    History   Social History  . Marital Status: Divorced    Spouse Name: N/A    Number of Children: 5  . Years of Education: N/A    Occupational History  . Print production planner     retired   Social History Main Topics  . Smoking status: Former Smoker -- 1.00 packs/day for 15 years    Types: Cigarettes    Quit date: 12/04/1981  . Smokeless tobacco: Not on file  . Alcohol Use: No  . Drug Use: No  . Sexually Active: Not on file   Other Topics Concern  . Not on file   Social History Narrative  . No narrative on file     ROS:  All 11 ROS were addressed and are negative except what is stated in the HPI  Physical Exam: Blood pressure 137/57, pulse 66, temperature 98.6 F (37 C), temperature source Oral, resp. rate 16, height 5\' 6"  (1.676 m), weight 64.8 kg (142 lb 13.7 oz), SpO2 97.00%.    General: Well developed, well nourished, in no acute distress Head: Eyes PERRLA, No xanthomas.   Normal cephalic and atramatic  Lungs:   Clear bilaterally to auscultation and percussion. Heart:   HRRR S1 S2 Pulses are 2+ & equal.            No carotid bruit. No JVD.  No abdominal bruits. No femoral bruits. Abdomen: Bowel sounds are positive, abdomen soft and non-tender without masses Extremities:   No clubbing, cyanosis or edema.  DP +1 Neuro: Alert and oriented X 3. Psych:  Good affect, responds appropriately    Labs:   Lab Results  Component Value Date   WBC 5.6 02/20/2013   HGB 11.2* 02/20/2013   HCT 33.0* 02/20/2013   MCV 88.0 02/20/2013   PLT 202 02/20/2013    Recent Labs Lab 02/20/13 1613 02/20/13 1626  NA 139 140  K 3.7 3.7  CL 103 105  CO2 25  --   BUN 15 16  CREATININE 1.10 1.20*  CALCIUM 9.6  --   PROT 6.4  --   BILITOT 0.2*  --   ALKPHOS 76  --   ALT 8  --   AST 18  --   GLUCOSE 94 92   No results found for this basename: PTT   Lab Results  Component Value Date   INR 0.91 02/20/2013   INR 1.11 11/02/2012  INR 0.98 06/11/2012   Lab Results  Component Value Date   TROPONINI <0.30 02/20/2013     Lab Results  Component Value Date   CHOL 166 02/21/2013   CHOL 176 09/04/2012   CHOL 177  06/12/2012   Lab Results  Component Value Date   HDL 35* 02/21/2013   HDL 35* 09/04/2012   HDL 40 06/12/2012   Lab Results  Component Value Date   LDLCALC 85 02/21/2013   LDLCALC 92 09/04/2012   LDLCALC 91 06/12/2012   Lab Results  Component Value Date   TRIG 232* 02/21/2013   TRIG 243* 09/04/2012   TRIG 229* 06/12/2012   Lab Results  Component Value Date   CHOLHDL 4.7 02/21/2013   CHOLHDL 5.0 09/04/2012   CHOLHDL 4.4 06/12/2012   No results found for this basename: LDLDIRECT      Radiology:  Dg Chest 2 View  02/20/2013  *RADIOLOGY REPORT*  Clinical Data: Stroke.  Cough.  CHEST - 2 VIEW  Comparison: Two-view chest x-ray 12/31/2012 [place], and 09/04/2012, 06/12/2012 [place].  Findings: Suboptimal inspiration.  Cardiac silhouette enlarged but stable.  Right subclavian dual lead transvenous pacemaker unchanged and appears intact.  Stable elevation of the right hemidiaphragm with chronic scar/atelectasis in the right lower lobe.  Lungs remain clear otherwise.  Pulmonary vascularity normal.  No pleural effusions.  Mild degenerative changes involving the thoracic spine. Prior left shoulder arthroplasty with anatomic alignment.  IMPRESSION: Stable cardiomegaly. Stable chronic elevation of the right hemidiaphragm with chronic scar/atelectasis in the right lower lobe.  No acute cardiopulmonary disease.   Original Report Authenticated By: Hulan Saas, M.D.    Ct Head Wo Contrast  02/20/2013  *RADIOLOGY REPORT*  Clinical Data: Code stroke.  Slurred speech and difficult to communicating.  CT HEAD WITHOUT CONTRAST  Technique:  Contiguous axial images were obtained from the base of the skull through the vertex without contrast.  Comparison: Head CT 01/24/2013 and multiple priors dating back to the 06/11/2005  Findings: Stable marked cerebral atrophy with associated ventriculomegaly and moderate periventricular chronic white matter ischemic changes.  Negative for hemorrhage, mass effect, midline shift,  mass lesion, or evidence of acute cortically based infarction.  There is chronic mucosal thickening and along the visualized aspect of the right maxillary sinus, with suspected postsurgical changes of the right maxillary sinus, partially visualized.  There is fluid within some of the right ethmoid air cells.  No chronic right mastoid effusion and postsurgical changes of the right mastoids are noted.  The skull is intact.  IMPRESSION: 1.  No acute intracranial abnormality.  2.  Stable cerebral atrophy, ventriculomegaly and periventricular white matter disease. 3.  Chronic right maxillary sinus disease, right ethmoid disease, and chronic right mastoid effusion.   Original Report Authenticated By: Britta Mccreedy, M.D.     EKG:  NSR with anterior and inferior infarct pattern no ST changes  ASSESSMENT:  1.  PAF with evidence of breakthrough of PAF on last pacer check 2.  Tachybrady syndrome s/p PPM 3.  Acute CVA  PLAN:   1.  Difficult situation - she clearly has breakthrough PAF on pacer check increasing her risk of cardioembolic events.  Her fall risk though is high and she is falling several times weekly.  I recommend Neuro discussing options at length with the family so that they fully understand the risks of using and not using anticoagulation.  One option would be to have an aide present when family cannot be there to help her when  she tries to ambulate to make sure she does not fall.  There has not been any data to show a troke reduction risk in patient with PAF where antiarrhythmic therapy is increased to suppress the PAF when anticoagulation is not used.  Quintella Reichert, MD  02/21/2013  10:58 AM

## 2013-02-21 NOTE — Evaluation (Signed)
Speech Language Pathology Evaluation Patient Details Name: TRYNITY SKOUSEN MRN: [id number] DOB: 1931-01-26 Today's Date: 02/21/2013 Time: 4098-1191 SLP Time Calculation (min): 20 min  Problem List:  Patient Active Problem List  Diagnosis  . TIA (transient ischemic attack)  . Hypertension  . Hyperlipidemia  . Microscopic hematuria  . Anemia  . UTI (lower urinary tract infection)  . Altered mental state  . PNA (pneumonia)  . Atrial fibrillation  . Hypokalemia  . Physical deconditioning   Past Medical History:  Past Medical History  Diagnosis Date  . Hypertension   . Hyperlipidemia   . GERD (gastroesophageal reflux disease)   . Dysphagia     appt with Molly Maduro Buccini upcoming  . Chronic recurrent sinusitis   . Recurrent UTI   . Atrial fibrillation     ???  . Arthritis   . TIA (transient ischemic attack)    Past Surgical History:  Past Surgical History  Procedure Laterality Date  . Pacemaker insertion    . Back surgery  1985  . Shoulder surgery  2008    Left  (Dr. Ranell Patrick)  . Total knee arthroplasty  2000, 2001    bilateral (Dr. Despina Hick), left 2000, right 2001  . Cataract extraction  1986, 2003    left 1986, right 2003  . Vaginal hysterectomy  1969  . Bilateral salpingoophorectomy  1971  . Tonsillectomy  1951  . Appendectomy  1952  . Mastoidectomy  1970  . Sinus exploration  1972  . Knee arthroscopy  1988, 1995, 2000  . Hemorrhoid surgery  2009  . Repair / reconstruction interphalangeal joint  2004    R thumb  . Cystoscopy  1992  . Insert / replace / remove pacemaker     HPI:  79 yo F with possible history of afib by chart with new onset aphasia likely due to ischemic stroke. She has received IV tPA   Assessment / Plan / Recommendation Clinical Impression  Pt presents with deficits in cognition and language.  Expression is fluent with  + grammatical form; evidence of pronoun confusions, dysnomias, and tendency to name details at expense of whole concepts.   Pt presents with impaired selective attention, decreased awareness, and difficulty following multistep commands.  Scans pending.  Recommend consideration of CIR consult.  SLP will follow acutely to address cognitive-communication deficits.      SLP Assessment  Patient needs continued Speech Language Pathology Services    Follow Up Recommendations  Inpatient Rehab    Frequency and Duration min 3x week  1 week   Pertinent Vitals/Pain No pain   SLP Goals  SLP Goals Potential to Achieve Goals: Good SLP Goal #1: Pt will selectively attend  to target stimuli in quiet environment with 80% accuracy. SLP Goal #2: Pt will follow 2-3 step commands with 80% accuracy and min assist SLP Goal #3: Pt will shift set from one topic to next with mod verb/visual cues and 80% success.  SLP Evaluation Prior Functioning  Cognitive/Linguistic Baseline: Information not available Type of Home: House Lives With: Son Available Help at Discharge: Available PRN/intermittently   Cognition  Overall Cognitive Status: Impaired Arousal/Alertness: Awake/alert Orientation Level: Oriented to person;Oriented to place;Oriented to situation Attention: Selective Selective Attention: Impaired Selective Attention Impairment: Verbal basic;Functional basic Memory: Impaired Memory Impairment: Storage deficit;Retrieval deficit Awareness: Impaired Problem Solving: Impaired Problem Solving Impairment: Verbal basic;Functional basic Behaviors: Perseveration Safety/Judgment: Impaired    Comprehension  Auditory Comprehension Overall Auditory Comprehension: Impaired Yes/No Questions: Within Functional Limits Commands: Impaired Multistep  Basic Commands: 25-49% accurate Interfering Components: Attention EffectiveTechniques: Extra processing time;Repetition Visual Recognition/Discrimination Discrimination: Within Function Limits    Expression Expression Primary Mode of Expression: Verbal Verbal Expression Overall  Verbal Expression: Impaired Initiation: No impairment Level of Generative/Spontaneous Verbalization: Conversation Repetition: No impairment Naming: Impairment Responsive: 51-75% accurate Confrontation: Impaired Convergent: 50-74% accurate Divergent: 25-49% accurate Verbal Errors: Not aware of errors Interfering Components: Attention Written Expression Dominant Hand: Right Written Expression: Exceptions to [facility] Dictation Ability: Phrase Self Formulation Ability: Phrase   Oral / Motor Oral Motor/Sensory Function Overall Oral Motor/Sensory Function: Appears within functional limits for tasks assessed Motor Speech Overall Motor Speech: Appears within functional limits for tasks assessed   GO   Thaila Bottoms L. Samson Frederic, [place] CCC/SLP Pager [phone]   Blenda Mounts Laurice 02/21/2013, 11:17 AM

## 2013-02-21 NOTE — Progress Notes (Signed)
Rehab Admissions Coordinator Note:  Patient was screened by Clois Dupes for appropriateness for an Inpatient Acute Rehab Consult.  Noted PT eval. At this time, we are recommending Inpatient Rehab consult.  Clois Dupes 02/21/2013, 2:30 PM  I can be reached at [phone].

## 2013-02-21 NOTE — Evaluation (Signed)
Clinical/Bedside Swallow Evaluation Patient Details  Name: [name] MRN: [id number] Date of Birth: [birth year]  Today's Date: 02/21/2013 Time: 1020-1035 SLP Time Calculation (min): 15 min  Past Medical History:  Past Medical History  Diagnosis Date  . Hypertension   . Hyperlipidemia   . GERD (gastroesophageal reflux disease)   . Dysphagia     appt with Molly Maduro Buccini upcoming  . Chronic recurrent sinusitis   . Recurrent UTI   . Atrial fibrillation     ???  . Arthritis   . TIA (transient ischemic attack)    Past Surgical History:  Past Surgical History  Procedure Laterality Date  . Pacemaker insertion    . Back surgery  1985  . Shoulder surgery  2008    Left  (Dr. Ranell Patrick)  . Total knee arthroplasty  2000, 2001    bilateral (Dr. Despina Hick), left 2000, right 2001  . Cataract extraction  1986, 2003    left 1986, right 2003  . Vaginal hysterectomy  1969  . Bilateral salpingoophorectomy  1971  . Tonsillectomy  1951  . Appendectomy  1952  . Mastoidectomy  1970  . Sinus exploration  1972  . Knee arthroscopy  1988, 1995, 2000  . Hemorrhoid surgery  2009  . Repair / reconstruction interphalangeal joint  2004    R thumb  . Cystoscopy  1992  . Insert / replace / remove pacemaker     HPI:  79 yo F with possible history of afib by chart with new onset aphasia likely due to ischemic stroke. She has received IV tPA   Assessment / Plan / Recommendation Clinical Impression  Pt presents with normal oropharyngeal swallow.  She reports hx of dilatation in past - no overt symptoms of esophageal issues present today.  Recommend resuming regular consistency diet, thin liquids, meds whole with liquids.  No SLP f/u warranted.    Aspiration Risk  Mild    Diet Recommendation Thin liquid;Regular   Liquid Administration via: Straw;Cup Medication Administration: Whole meds with liquid Supervision: Intermittent supervision to cue for compensatory strategies Postural Changes and/or  Swallow Maneuvers: Seated upright 90 degrees;Upright 30-60 min after meal    Other  Recommendations Oral Care Recommendations: Oral care BID         Pertinent Vitals/Pain No pain    SLP Swallow Goals     Swallow Study Prior Functional Status  Type of Home: House Lives With: Son Available Help at Discharge: Available PRN/intermittently (per pt son works from home)    General Date of Onset: 02/20/13 HPI: 79 yo F with possible history of afib by chart with new onset aphasia likely due to ischemic stroke. She has received IV tPA Type of Study: Bedside swallow evaluation Previous Swallow Assessment: during prior admissions with normal outcomes Diet Prior to this Study: NPO Temperature Spikes Noted: No Respiratory Status: Room air History of Recent Intubation: No Behavior/Cognition: Alert;Pleasant mood;Confused Oral Cavity - Dentition: Dentures, top;Dentures, bottom Self-Feeding Abilities: Able to feed self Patient Positioning: Upright in chair Baseline Vocal Quality: Clear Volitional Cough: Strong Volitional Swallow: Able to elicit    Oral/Motor/Sensory Function Overall Oral Motor/Sensory Function: Appears within functional limits for tasks assessed   Ice Chips Ice chips: Within functional limits   Thin Liquid Thin Liquid: Within functional limits    Nectar Thick Nectar Thick Liquid: Not tested   Honey Thick Honey Thick Liquid: Not tested   Puree Puree: Within functional limits   Solid  Chi Woodham L. Samson Frederic, [place] CCC/SLP Pager [phone]  Solid: Within functional limits       Blenda Mounts Laurice 02/21/2013,11:04 AM

## 2013-02-21 NOTE — Progress Notes (Signed)
UA with possible UTI (present on admission). Urine culture pending. Will place on Septra bid x 7 days for now. Discontinue if cx neg. Discussed with RN and Dr. Marjory Lies.  Annie Main, MSN, RN, ANVP-BC, ANP-BC, GNP-BC [facility] Pager: [phone] 02/21/2013 3:00 PM

## 2013-02-21 NOTE — Progress Notes (Signed)
VASCULAR LAB PRELIMINARY  PRELIMINARY  PRELIMINARY  PRELIMINARY  Carotid duplex  completed.    Preliminary report:  Bilateral:  No evidence of hemodynamically significant internal carotid artery stenosis.   Vertebral artery flow is antegrade.      Shemia Bevel, RVT 02/21/2013, 1:37 PM

## 2013-02-21 NOTE — Progress Notes (Signed)
Stroke Team Progress Note  HISTORY [name] is a 79 y.o. female who was in her normal state at 2:45 pm 02/20/2013, then was seen by her son to not be able to speak around 3:15 pm. This has happened two times before, but both were transient. She received TPA.   She was admitted to the neuro ICU for further evaluation and treatment.  SUBJECTIVE Her son [name] is at the bedside.  Overall she feels her condition is gradually improving. patient lives with son. Patient asked her son to make her toast. After making, son noticed her speaking jibberish and didn't know his name - called him her brother [name] name. Started at 1545 and started improving yesterday evening. He feels she is much better today, but still confused. Son reports hx TIAs (most recent, transient right leg weakness in Feb 2014; July and Oct 2013 aphasia events). He denies history of any memory problems; significant problems has been physical.  OBJECTIVE Most recent Vital Signs: Filed Vitals:   02/21/13 0400 02/21/13 0500 02/21/13 0600 02/21/13 0700  BP: 146/68 154/66 122/51 135/50  Pulse: 64 62 64 61  Temp: 98.6 F (37 C)     TempSrc: Oral     Resp: 17 12 15 17   Height:      Weight:      SpO2: 95% 98% 95% 95%   CBG (last 3)   Recent Labs  02/20/13 1636  GLUCAP 78    IV Fluid Intake:   . sodium chloride 75 mL/hr at 02/21/13 0600    MEDICATIONS  . DULoxetine  60 mg Oral Daily  . levothyroxine  25 mcg Oral QAC breakfast  .  morphine injection  1-2 mg Intravenous Once  . pantoprazole (PROTONIX) IV  40 mg Intravenous QHS  . simvastatin  40 mg Oral q1800  . sotalol  120 mg Oral Q12H   PRN:  acetaminophen, acetaminophen, labetalol, ondansetron (ZOFRAN) IV  Diet:  NPO  Activity:  Bedrest DVT Prophylaxis:  SCDs   CLINICALLY SIGNIFICANT STUDIES Basic Metabolic Panel:  Recent Labs Lab 02/20/13 1613 02/20/13 1626  NA 139 140  K 3.7 3.7  CL 103 105  CO2 25  --   GLUCOSE 94 92  BUN 15 16  CREATININE  1.10 1.20*  CALCIUM 9.6  --    Liver Function Tests:  Recent Labs Lab 02/20/13 1613  AST 18  ALT 8  ALKPHOS 76  BILITOT 0.2*  PROT 6.4  ALBUMIN 3.2*   CBC:  Recent Labs Lab 02/20/13 1613 02/20/13 1626  WBC 5.6  --   NEUTROABS 2.3  --   HGB 10.2* 11.2*  HCT 32.2* 33.0*  MCV 88.0  --   PLT 202  --    Coagulation:  Recent Labs Lab 02/20/13 1613  LABPROT 12.2  INR 0.91   Cardiac Enzymes:  Recent Labs Lab 02/20/13 1614  TROPONINI <0.30   Urinalysis:  Recent Labs Lab 02/21/13 0038  COLORURINE YELLOW  LABSPEC 1.019  PHURINE 5.5  GLUCOSEU NEGATIVE  HGBUR SMALL*  BILIRUBINUR NEGATIVE  KETONESUR NEGATIVE  PROTEINUR 100*  UROBILINOGEN 0.2  NITRITE NEGATIVE  LEUKOCYTESUR MODERATE*   Lipid Panel    Component Value Date/Time   CHOL 166 02/21/2013 0305   TRIG 232* 02/21/2013 0305   HDL 35* 02/21/2013 0305   CHOLHDL 4.7 02/21/2013 0305   VLDL 46* 02/21/2013 0305   LDLCALC 85 02/21/2013 0305   HgbA1C  Lab Results  Component Value Date   HGBA1C 5.6 09/04/2012  Urine Drug Screen:     Component Value Date/Time   LABOPIA POSITIVE* 02/21/2013 0038   COCAINSCRNUR NONE DETECTED 02/21/2013 0038   LABBENZ POSITIVE* 02/21/2013 0038   AMPHETMU NONE DETECTED 02/21/2013 0038   THCU NONE DETECTED 02/21/2013 0038   LABBARB POSITIVE* 02/21/2013 0038    Alcohol Level:   Recent Labs Lab 02/20/13 1613  ETH <11   CT of the brain  02/21/2013 02/20/2013  1.  No acute intracranial abnormality.  2.  MODERATE stable cerebral atrophy, ventriculomegaly and periventricular white matter disease. 3.  Chronic right maxillary sinus disease, right ethmoid disease, and chronic right mastoid effusion.  MRI/A of the brain  Cannot due to pacemaker  2D Echocardiogram    Carotid Doppler    CXR  02/20/2013  Stable cardiomegaly. Stable chronic elevation of the right hemidiaphragm with chronic scar/atelectasis in the right lower lobe.  No acute cardiopulmonary disease.     EKG  atrial  fibrillation, rate 63.   Therapy Recommendations   Physical Exam    GENERAL EXAM: Patient is in no distress  CARDIOVASCULAR: Regular rate and rhythm, no murmurs, no carotid bruits  NEUROLOGIC: MENTAL STATUS: awake, alert, language fluent, comprehension intact, naming intact; ORIENTED TO "[employer] [name], [employer], '14". NAMES PEN. NO FRONTAL RELEASE SIGNS. CRANIAL NERVE: pupils equal and reactive to light, visual fields full to confrontation, extraocular muscles intact, no nystagmus, facial sensation and strength symmetric, uvula midline, shoulder shrug symmetric, tongue midline. MOTOR: normal bulk and tone, DIFFUSE 4/5 strength in the BUE, BLE; NO DRIFT.  SENSORY: normal and symmetric to light touch COORDINATION: SLOW FTN BILATERALLY REFLEXES: deep tendon reflexes present and symmetric   ASSESSMENT Ms. [name] is a 79 y.o. female presenting with inability to speak. Status post IV t-PA 02/20/2013 at 1645. Left brain stroke suspected. Imaging pending. Infarct, if present, likely embolic secondary to known atrial fibrillation.  On clopidogrel 75 mg orally every day prior to admission. Now on no antiplatelets as within 24h of tpa for secondary stroke prevention. Patient with resultant expressive aphasia. Work up underway.  Headache x 3-4 days prior to admission reported by son atrial fibrillation. Had been on warfarin in the past. Taken off due falls. Hypertension Hyperlipidemia, LDL 85, on statin PTA, on statin now, at goal LDL < 100 Dysphagia PTA Hx TIA HgbA1c pending UDS positive for opiates, benzo and barbiturates. On fiornal, diazepam and tylenol #4 most every night for back pain. Son dispenses her medications at night.  Hospital day # 1  TREATMENT/PLAN  Resume  clopidogrel 75 mg orally every day  And add aspirin 81 mg dialy after 1645 today and prior to midnight for secondary stroke prevention if imaging negative for hemorrhage. Re-consider anticoagulation given recurrent  TIA and current possible stroke  Consult Dr. Eldridge Dace (has seen patient in the past for atrial fibrillation) for consideration of initiation of anticoagulation (recurrent TIA and stroke; atrial fibrillation); dilemma is that she was on warfarin in the past but d/c'd due to fall risk. Now her situation may indicate more benefit vs. risk of anticoagulation  OOB. Therapy evals  Transfer to floor 24h after tPA if imaging and patient stable  F/u CT at 1645, 2D, carotid doppler  TCD to eval intracranial vessels  Annie Main, MSN, RN, ANVP-BC, ANP-BC, GNP-BC [facility] Pager: [phone] 02/21/2013 8:14 AM  I have personally obtained a history, examined the patient, evaluated imaging results, and formulated the assessment and plan of care. I agree with the above. We will  re-consider long term anticoagulation for atrial fibrillation and recurrent TIA/stroke, perhaps with newer generation anticoagulant.   Suanne Marker, MD 02/21/2013, 10:09 AM Certified in Neurology, Neurophysiology and Neuroimaging [facility] - Stroke Team  Please refer to [email or web address] for on-call Stroke MD

## 2013-02-21 NOTE — Progress Notes (Signed)
  Echocardiogram 2D Echocardiogram has been performed.  [name] A 02/21/2013, 12:00 PM

## 2013-02-22 DIAGNOSIS — R4182 Altered mental status, unspecified: Secondary | ICD-10-CM

## 2013-02-22 DIAGNOSIS — G459 Transient cerebral ischemic attack, unspecified: Secondary | ICD-10-CM

## 2013-02-22 DIAGNOSIS — I4891 Unspecified atrial fibrillation: Secondary | ICD-10-CM

## 2013-02-22 LAB — URINE CULTURE: Colony Count: >=100000

## 2013-02-22 MED ORDER — LEVOTHYROXINE SODIUM 25 MCG PO TABS
25.0000 ug | ORAL_TABLET | Freq: Every morning | ORAL | Status: DC
  Administered 2013-02-23 – 2013-02-25 (×3): 25 ug via ORAL
  Filled 2013-02-22 (×3): qty 1

## 2013-02-22 MED ORDER — SOTALOL HCL 120 MG PO TABS
120.0000 mg | ORAL_TABLET | Freq: Two times a day (BID) | ORAL | Status: DC
  Administered 2013-02-22 – 2013-02-25 (×7): 120 mg via ORAL
  Filled 2013-02-22 (×7): qty 1

## 2013-02-22 MED ORDER — DULOXETINE HCL 60 MG PO CPEP
60.0000 mg | ORAL_CAPSULE | Freq: Every morning | ORAL | Status: DC
  Administered 2013-02-23 – 2013-02-25 (×3): 60 mg via ORAL
  Filled 2013-02-22 (×3): qty 1

## 2013-02-22 MED ORDER — ADULT MULTIVITAMIN W/MINERALS CH
1.0000 | ORAL_TABLET | Freq: Every morning | ORAL | Status: DC
  Administered 2013-02-23 – 2013-02-25 (×3): 1 via ORAL
  Filled 2013-02-22 (×3): qty 1

## 2013-02-22 MED ORDER — CALCITRIOL 0.25 MCG PO CAPS
0.2500 ug | ORAL_CAPSULE | Freq: Every morning | ORAL | Status: DC
  Administered 2013-02-23 – 2013-02-25 (×3): 0.25 ug via ORAL
  Filled 2013-02-22 (×3): qty 1

## 2013-02-22 MED ORDER — OMEGA-3-ACID ETHYL ESTERS 1 G PO CAPS
1.0000 g | ORAL_CAPSULE | Freq: Every morning | ORAL | Status: DC
  Administered 2013-02-23 – 2013-02-25 (×3): 1 g via ORAL
  Filled 2013-02-22 (×3): qty 1

## 2013-02-22 MED ORDER — ACETAMINOPHEN 325 MG PO TABS: 325.0000 mg | ORAL_TABLET | Freq: Four times a day (QID) | ORAL | Status: DC | PRN

## 2013-02-22 MED ORDER — SIMVASTATIN 40 MG PO TABS
40.0000 mg | ORAL_TABLET | Freq: Every evening | ORAL | Status: DC
  Administered 2013-02-22 – 2013-02-25 (×4): 40 mg via ORAL
  Filled 2013-02-22 (×4): qty 1

## 2013-02-22 MED ORDER — PANTOPRAZOLE SODIUM 40 MG PO TBEC
40.0000 mg | DELAYED_RELEASE_TABLET | Freq: Two times a day (BID) | ORAL | Status: DC
  Administered 2013-02-22 – 2013-02-25 (×7): 40 mg via ORAL
  Filled 2013-02-22 (×6): qty 1

## 2013-02-22 MED ORDER — CLOPIDOGREL BISULFATE 75 MG PO TABS
75.0000 mg | ORAL_TABLET | Freq: Every day | ORAL | Status: DC
  Filled 2013-02-22: qty 1

## 2013-02-22 MED ORDER — ALLOPURINOL 100 MG PO TABS
100.0000 mg | ORAL_TABLET | Freq: Two times a day (BID) | ORAL | Status: DC
  Administered 2013-02-22 – 2013-02-25 (×7): 100 mg via ORAL
  Filled 2013-02-22 (×8): qty 1

## 2013-02-22 NOTE — Progress Notes (Signed)
Subjective:  No current complaints. No CP.  Gait instability. Objective:  Vital Signs in the last 24 hours: Temp:  [97.6 F (36.4 C)-98.6 F (37 C)] 98.6 F (37 C) (03/22 0827) Pulse Rate:  [51-91] 68 (03/22 0600) Resp:  [15-24] 16 (03/22 0600) BP: (106-155)/(47-113) 127/47 mmHg (03/22 0600) SpO2:  [91 %-100 %] 95 % (03/22 0600)  Intake/Output from previous day: 03/21 0701 - 03/22 0700 In: 1725 [I.V.:1725] Out: 550 [Urine:550]   Physical Exam: General: Elderly. Head:  Normocephalic and atraumatic. Lungs: Clear to auscultation and percussion. Heart: Brady paced.  No murmur, rubs or gallops.  Abdomen: soft, non-tender, positive bowel sounds. Extremities: No clubbing or cyanosis. No edema. Neurologic: Alert.     Lab Results:  Recent Labs  02/20/13 1613 02/20/13 1626  WBC 5.6  --   HGB 10.2* 11.2*  PLT 202  --     Recent Labs  02/20/13 1613 02/20/13 1626  NA 139 140  K 3.7 3.7  CL 103 105  CO2 25  --   GLUCOSE 94 92  BUN 15 16  CREATININE 1.10 1.20*    Recent Labs  02/20/13 1614  TROPONINI <0.30   Hepatic Function Panel  Recent Labs  02/20/13 1613  PROT 6.4  ALBUMIN 3.2*  AST 18  ALT 8  ALKPHOS 76  BILITOT 0.2*    Recent Labs  02/21/13 0305  CHOL 166   No results found for this basename: PROTIME,  in the last 72 hours  Imaging: Dg Chest 2 View  02/20/2013  *RADIOLOGY REPORT*  Clinical Data: Stroke.  Cough.  CHEST - 2 VIEW  Comparison: Two-view chest x-ray 12/31/2012 [place], and 09/04/2012, 06/12/2012 [place].  Findings: Suboptimal inspiration.  Cardiac silhouette enlarged but stable.  Right subclavian dual lead transvenous pacemaker unchanged and appears intact.  Stable elevation of the right hemidiaphragm with chronic scar/atelectasis in the right lower lobe.  Lungs remain clear otherwise.  Pulmonary vascularity normal.  No pleural effusions.  Mild degenerative changes involving the thoracic spine. Prior left shoulder arthroplasty with  anatomic alignment.  IMPRESSION: Stable cardiomegaly. Stable chronic elevation of the right hemidiaphragm with chronic scar/atelectasis in the right lower lobe.  No acute cardiopulmonary disease.   Original Report Authenticated By: Hulan Saas, M.D.    Ct Head Wo Contrast  02/21/2013  *RADIOLOGY REPORT*  Clinical Data: 24 hours post t-PA  CT HEAD WITHOUT CONTRAST  Technique:  Contiguous axial images were obtained from the base of the skull through the vertex without contrast.  Comparison: CT 02/20/2013  Findings: Moderate atrophy.  Mild chronic microvascular ischemia in the white matter, unchanged.  No acute infarct.  Negative for intracranial hemorrhage or mass.  Chronic sinusitis  IMPRESSION: Atrophy and chronic microvascular ischemia.  No acute abnormality. Negative for hemorrhage.   Original Report Authenticated By: Janeece Riggers, M.D.    Ct Head Wo Contrast  02/20/2013  *RADIOLOGY REPORT*  Clinical Data: Code stroke.  Slurred speech and difficult to communicating.  CT HEAD WITHOUT CONTRAST  Technique:  Contiguous axial images were obtained from the base of the skull through the vertex without contrast.  Comparison: Head CT 01/24/2013 and multiple priors dating back to the 06/11/2005  Findings: Stable marked cerebral atrophy with associated ventriculomegaly and moderate periventricular chronic white matter ischemic changes.  Negative for hemorrhage, mass effect, midline shift, mass lesion, or evidence of acute cortically based infarction.  There is chronic mucosal thickening and along the visualized aspect of the right maxillary sinus, with suspected postsurgical  changes of the right maxillary sinus, partially visualized.  There is fluid within some of the right ethmoid air cells.  No chronic right mastoid effusion and postsurgical changes of the right mastoids are noted.  The skull is intact.  IMPRESSION: 1.  No acute intracranial abnormality.  2.  Stable cerebral atrophy, ventriculomegaly and  periventricular white matter disease. 3.  Chronic right maxillary sinus disease, right ethmoid disease, and chronic right mastoid effusion.   Original Report Authenticated By: Britta Mccreedy, M.D.    Personally viewed.   Telemetry: V paced.  Personally viewed.   Assessment/Plan:   1. PAF with evidence of breakthrough of PAF on last pacer check  2. Tachybrady syndrome s/p PPM  3. Acute CVA  - Would recommend anticoagulation due to high increase for debilitating stroke. - Consider apixiban 5mg  PO BID (decreased ICH than warfarin). If she decides to move forward then stop ASA/Plavix if using NOAC.   -Pacer functioning well.  -Challenging situation with gait instability.       SKAINS, MARK 02/22/2013, 10:15 AM

## 2013-02-22 NOTE — Progress Notes (Addendum)
Stroke Team Progress Note  HISTORY [name] is a 79 y.o. female who was in her normal state at 2:45 pm 02/20/2013, then was seen by her son to not be able to speak around 3:15 pm.  Patient asked her son to make her toast. After making, son noticed her speaking jibberish and didn't know his name - called him her brother Robert's name. Started improving on 02/20/13 evening. This has happened two times before, but both were transient. She received TPA for this event on 02/20/13.  She was admitted to the neuro ICU for further evaluation and treatment.  SUBJECTIVE Her son [name] is at the bedside.  Overall she feels her condition is gradually improving. patient lives with son. He feels she is much better today, but still confused. Son reports hx TIAs (most recent, transient right leg weakness in Feb 2014; July and Oct 2013 aphasia events). He denies history of any memory problems; significant problems has been physical.  OBJECTIVE Most recent Vital Signs: Filed Vitals:   02/22/13 0418 02/22/13 0500 02/22/13 0600 02/22/13 0827  BP:  106/61 127/47   Pulse:  52 68   Temp: 97.6 F (36.4 C)   98.6 F (37 C)  TempSrc: Oral   Oral  Resp:  22 16   Height:      Weight:      SpO2:  94% 95%    CBG (last 3)   Recent Labs  02/20/13 1636  GLUCAP 78    IV Fluid Intake:   . sodium chloride 75 mL/hr at 02/22/13 0500    MEDICATIONS  . aspirin EC  81 mg Oral Daily  . DULoxetine  60 mg Oral Daily  . levothyroxine  25 mcg Oral QAC breakfast  .  morphine injection  1-2 mg Intravenous Once  . pantoprazole (PROTONIX) IV  40 mg Intravenous QHS  . simvastatin  40 mg Oral q1800  . sotalol  120 mg Oral Q12H  . sulfamethoxazole-trimethoprim  1 tablet Oral Q12H   PRN:  acetaminophen, acetaminophen, labetalol, ondansetron (ZOFRAN) IV  Diet:  Cardiac  Activity:  Bedrest DVT Prophylaxis:  SCDs   CLINICALLY SIGNIFICANT STUDIES Basic Metabolic Panel:   Recent Labs Lab 02/20/13 1613  02/20/13 1626  NA 139 140  K 3.7 3.7  CL 103 105  CO2 25  --   GLUCOSE 94 92  BUN 15 16  CREATININE 1.10 1.20*  CALCIUM 9.6  --    Liver Function Tests:   Recent Labs Lab 02/20/13 1613  AST 18  ALT 8  ALKPHOS 76  BILITOT 0.2*  PROT 6.4  ALBUMIN 3.2*   CBC:   Recent Labs Lab 02/20/13 1613 02/20/13 1626  WBC 5.6  --   NEUTROABS 2.3  --   HGB 10.2* 11.2*  HCT 32.2* 33.0*  MCV 88.0  --   PLT 202  --    Coagulation:   Recent Labs Lab 02/20/13 1613  LABPROT 12.2  INR 0.91   Cardiac Enzymes:   Recent Labs Lab 02/20/13 1614  TROPONINI <0.30   Urinalysis:   Recent Labs Lab 02/21/13 0038  COLORURINE YELLOW  LABSPEC 1.019  PHURINE 5.5  GLUCOSEU NEGATIVE  HGBUR SMALL*  BILIRUBINUR NEGATIVE  KETONESUR NEGATIVE  PROTEINUR 100*  UROBILINOGEN 0.2  NITRITE NEGATIVE  LEUKOCYTESUR MODERATE*   Lipid Panel    Component Value Date/Time   CHOL 166 02/21/2013 0305   TRIG 232* 02/21/2013 0305   HDL 35* 02/21/2013 0305   CHOLHDL 4.7 02/21/2013 0305  VLDL 46* 02/21/2013 0305   LDLCALC 85 02/21/2013 0305   HgbA1C  Lab Results  Component Value Date   HGBA1C 6.1* 02/21/2013    Urine Drug Screen:     Component Value Date/Time   LABOPIA POSITIVE* 02/21/2013 0038   COCAINSCRNUR NONE DETECTED 02/21/2013 0038   LABBENZ POSITIVE* 02/21/2013 0038   AMPHETMU NONE DETECTED 02/21/2013 0038   THCU NONE DETECTED 02/21/2013 0038   LABBARB POSITIVE* 02/21/2013 0038    Alcohol Level:   Recent Labs Lab 02/20/13 1613  ETH <11   CT of the brain  02/21/2013 Stable. Moderate difuse atrophy and chronic small vessel ischemic disease  02/20/2013  1.  No acute intracranial abnormality.  2.  MODERATE stable cerebral atrophy, ventriculomegaly and periventricular white matter disease. 3.  Chronic right maxillary sinus disease, right ethmoid disease, and chronic right mastoid effusion.  MRI/A of the brain  Cannot due to pacemaker  2D Echocardiogram  Report pending  Carotid Doppler    Prelim - No evidence of hemodynamically significant internal carotid artery stenosis. Vertebral artery flow is antegrade.   CXR  02/20/2013  Stable cardiomegaly. Stable chronic elevation of the right hemidiaphragm with chronic scar/atelectasis in the right lower lobe.  No acute cardiopulmonary disease.     EKG  atrial fibrillation, rate 63.   Therapy Recommendations   Physical Exam    GENERAL EXAM: Patient is in no distress  CARDIOVASCULAR: Regular rate and rhythm, no murmurs, no carotid bruits  NEUROLOGIC: MENTAL STATUS: awake, alert, language fluent, comprehension intact, naming intact; ORIENTED TO "[facility] [place] END OF MONTH, 2014". REGISTERS 3/3. RECALLS 1/3. ABLE TO READ. NO FRONTAL RELEASE SIGNS. CRANIAL NERVE: pupils equal and reactive to light, visual fields full to confrontation, extraocular muscles intact, no nystagmus, facial sensation and strength symmetric; EXCEPT SLIGHTLY DECR LEFT LOWER FACIAL STRENGTH, uvula midline, shoulder shrug symmetric, tongue midline. MOTOR: normal bulk and tone, DIFFUSE 4/5 strength in the BUE, BLE; LIMITED IN LEFT ARM DUE TO SHOULDER PAIN.  SENSORY: normal and symmetric to light touch COORDINATION: SLOW FTN BILATERALLY REFLEXES: deep tendon reflexes present and symmetric GAIT: STOOPED POSTURE   ASSESSMENT [name] is a 79 y.o. female presenting with inability to speak. Status post IV t-PA 02/20/2013 at 1645. Left brain stroke suspected. Imaging pending. Infarct, if present, likely embolic secondary to known atrial fibrillation.  On aspirin 81mg  daily.  On clopidogrel 75 mg orally every day prior to admission.   atrial fibrillation. Had been on warfarin in the past, but stopped due to fall risk Hypertension Hyperlipidemia, LDL 85, on statin PTA, on statin now, at goal LDL < 100 Dysphagia PTA Hx TIA HgbA1c pending UDS positive for opiates, benzo and barbiturates. On fiornal, diazepam and tylenol #4 most every night for back  pain. Son dispenses her medications at night. Positive UTI  Hospital day # 2  TREATMENT/PLAN  Resume clopidogrel 75 mg orally every day and continue aspirin 81 mg daily for secondary stroke prevention  Re-consider anticoagulation given recurrent TIA  OOB. Therapy evals  Transfer to floor  F/u TTE  TCD to eval intracranial vessels  Continue septra for UTI  I have personally obtained a history, examined the patient, evaluated imaging results, and formulated the assessment and plan of care. I agree with the above. We will re-consider long term anticoagulation for atrial fibrillation and recurrent TIA/stroke, perhaps with newer generation anticoagulant. D/w Dr. Anne Fu. Will have to d/w son when he arrives.   Suanne Marker, MD 02/22/2013, 9:48 AM  Certified in Neurology, Neurophysiology and Neuroimaging [facility] - Stroke Team  Please refer to [email or web address] for on-call Stroke MD

## 2013-02-22 NOTE — Progress Notes (Signed)
Pt with questionable decrease in UOP.  Pt asked if she could urinate and stated that she could.  Hat placed in commode.  Pt reported that she urinated and moved her bowels while in the bathroom but no urine was collected in the hat.  Pt was assisted back to bed by NT.  RN bladder scanned pt shortly after returning from the bathroom.  Bladder scan revealed 200 ml.  Will continue to closely monitor for urine output. [name], [facility]

## 2013-02-23 MED ORDER — TRAMADOL HCL 50 MG PO TABS
50.0000 mg | ORAL_TABLET | Freq: Once | ORAL | Status: AC
  Administered 2013-02-24: 50 mg via ORAL
  Filled 2013-02-23: qty 1

## 2013-02-23 MED ORDER — BISACODYL 10 MG RE SUPP
10.0000 mg | Freq: Once | RECTAL | Status: AC
  Administered 2013-02-24: 10 mg via RECTAL
  Filled 2013-02-23: qty 1

## 2013-02-23 MED ORDER — APIXABAN 5 MG PO TABS
5.0000 mg | ORAL_TABLET | Freq: Two times a day (BID) | ORAL | Status: DC
  Administered 2013-02-23 – 2013-02-25 (×6): 5 mg via ORAL
  Filled 2013-02-23 (×6): qty 1

## 2013-02-23 NOTE — Progress Notes (Signed)
Stroke Team Progress Note  HISTORY [name] is a 79 y.o. female who was in her normal state at 2:45 pm 02/20/2013, then was seen by her son to not be able to speak around 3:15 pm.  Patient asked her son to make her toast. After making, son noticed her speaking jibberish and didn't know his name - called him her brother Robert's name. Started improving on 02/20/13 evening. This has happened two times before, but both were transient. She received TPA for this event on 02/20/13.  She was admitted to the neuro ICU for further evaluation and treatment.  SUBJECTIVE Two sons are at bedside this morning. The patient lives with one of her sons. They are here this morning to discuss further anticoagulation options with the physicians. The patient has no specific complaints other than the food.  OBJECTIVE Most recent Vital Signs: Filed Vitals:   02/22/13 2200 02/23/13 0200 02/23/13 0600 02/23/13 0902  BP: 168/51 157/50 163/49 176/91  Pulse: 64 63 68 69  Temp: 98.1 F (36.7 C) 97.6 F (36.4 C) 97.7 F (36.5 C) 98.8 F (37.1 C)  TempSrc:    Oral  Resp: 16 16 16 18   Height:      Weight:      SpO2: 99% 97% 96% 100%   CBG (last 3)   Recent Labs  02/20/13 1636  GLUCAP 78    IV Fluid Intake:      MEDICATIONS  . allopurinol  100 mg Oral BID  . aspirin EC  81 mg Oral Daily  . calcitRIOL  0.25 mcg Oral q morning - 10a  . clopidogrel  75 mg Oral Q breakfast  . DULoxetine  60 mg Oral q morning - 10a  . levothyroxine  25 mcg Oral q morning - 10a  . multivitamin with minerals  1 tablet Oral q morning - 10a  . omega-3 acid ethyl esters  1 g Oral q morning - 10a  . pantoprazole  40 mg Oral BID  . simvastatin  40 mg Oral QPM  . sotalol  120 mg Oral BID  . sulfamethoxazole-trimethoprim  1 tablet Oral Q12H   PRN:  acetaminophen, labetalol, ondansetron (ZOFRAN) IV  Diet:  Cardiac with thin liquids Activity: activity as tolerated DVT Prophylaxis:  SCDs   CLINICALLY SIGNIFICANT  STUDIES Basic Metabolic Panel:   Recent Labs Lab 02/20/13 1613 02/20/13 1626  NA 139 140  K 3.7 3.7  CL 103 105  CO2 25  --   GLUCOSE 94 92  BUN 15 16  CREATININE 1.10 1.20*  CALCIUM 9.6  --    Liver Function Tests:   Recent Labs Lab 02/20/13 1613  AST 18  ALT 8  ALKPHOS 76  BILITOT 0.2*  PROT 6.4  ALBUMIN 3.2*   CBC:   Recent Labs Lab 02/20/13 1613 02/20/13 1626  WBC 5.6  --   NEUTROABS 2.3  --   HGB 10.2* 11.2*  HCT 32.2* 33.0*  MCV 88.0  --   PLT 202  --    Coagulation:   Recent Labs Lab 02/20/13 1613  LABPROT 12.2  INR 0.91   Cardiac Enzymes:   Recent Labs Lab 02/20/13 1614  TROPONINI <0.30   Urinalysis:   Recent Labs Lab 02/21/13 0038  COLORURINE YELLOW  LABSPEC 1.019  PHURINE 5.5  GLUCOSEU NEGATIVE  HGBUR SMALL*  BILIRUBINUR NEGATIVE  KETONESUR NEGATIVE  PROTEINUR 100*  UROBILINOGEN 0.2  NITRITE NEGATIVE  LEUKOCYTESUR MODERATE*   Lipid Panel    Component Value  Date/Time   CHOL 166 02/21/2013 0305   TRIG 232* 02/21/2013 0305   HDL 35* 02/21/2013 0305   CHOLHDL 4.7 02/21/2013 0305   VLDL 46* 02/21/2013 0305   LDLCALC 85 02/21/2013 0305   HgbA1C  Lab Results  Component Value Date   HGBA1C 6.1* 02/21/2013    Urine Drug Screen:     Component Value Date/Time   LABOPIA POSITIVE* 02/21/2013 0038   COCAINSCRNUR NONE DETECTED 02/21/2013 0038   LABBENZ POSITIVE* 02/21/2013 0038   AMPHETMU NONE DETECTED 02/21/2013 0038   THCU NONE DETECTED 02/21/2013 0038   LABBARB POSITIVE* 02/21/2013 0038    Alcohol Level:   Recent Labs Lab 02/20/13 1613  ETH <11   CT of the brain  02/21/2013 Stable. Moderate difuse atrophy and chronic small vessel ischemic disease  02/20/2013  1.  No acute intracranial abnormality.  2.  MODERATE stable cerebral atrophy, ventriculomegaly and periventricular white matter disease. 3.  Chronic right maxillary sinus disease, right ethmoid disease, and chronic right mastoid effusion.  MRI/A of the brain  Cannot due  to pacemaker  2D Echocardiogram  ejection fraction 65-70% - no cardiac source of emboli was identified.  Carotid Doppler   Prelim - No evidence of hemodynamically significant internal carotid artery stenosis. Vertebral artery flow is antegrade.   CXR  02/20/2013  Stable cardiomegaly. Stable chronic elevation of the right hemidiaphragm with chronic scar/atelectasis in the right lower lobe.  No acute cardiopulmonary disease.     EKG  atrial fibrillation, rate 63.   Therapy Recommendations - inpatient rehabilitation consult recommended  Physical Exam   General - pleasantly confused 79 year old female in no acute distress. Heart - irregularly irregular rhythm - no murmer Lungs - Clear to auscultation Abdomen - Soft - non tender Extremities - Distal pulses intact - no edema Skin - Warm and dry   NEUROLOGIC: MENTAL STATUS: awake, alert, language fluent, comprehension intact, naming intact; NO FRONTAL RELEASE SIGNS. CRANIAL NERVE: pupils equal and reactive to light, visual fields full to confrontation, extraocular muscles intact, no nystagmus, facial sensation and strength symmetric; EXCEPT SLIGHTLY DECR LEFT LOWER FACIAL STRENGTH, uvula midline, shoulder shrug symmetric, tongue midline. MOTOR: normal bulk and tone, DIFFUSE 4/5 strength in the BUE, BLE; LIMITED IN LEFT ARM DUE TO SHOULDER PAIN.  SENSORY: normal and symmetric to light touch COORDINATION: SLOW FTN BILATERALLY REFLEXES: deep tendon reflexes present and symmetric GAIT: STOOPED POSTURE   ASSESSMENT [name] is a 79 y.o. female presenting with inability to speak. Status post IV t-PA 02/20/2013 at 1645. Left brain stroke suspected. Unable to have MRI secondary to pacemaker. Infarct, if present, likely embolic secondary to known atrial fibrillation.  On aspirin 81mg  daily.  On clopidogrel 75 mg orally every day prior to admission.   atrial fibrillation. Had been on warfarin in the past, but stopped due to fall  risk Hypertension Hyperlipidemia, LDL 85, on statin PTA, on statin now, at goal LDL < 100 Dysphagia PTA Hx TIA HgbA1c - 6.1 UDS positive for opiates, benzo and barbiturates. On fiornal, diazepam and tylenol #4 most every night for back pain. Son dispenses her medications at night. Positive UTI on seven-day course of Septra started 02/21/2013 Permanent pacemaker - no MRI Anemia - improving - recheck tomorrow Mild renal insufficiency - recheck tomorrow  Hospital day # 3  TREATMENT/PLAN  Resume clopidogrel 75 mg orally every day and continue aspirin 81 mg daily for secondary stroke prevention  Re-consider anticoagulation given recurrent TIA - to be discussed with the  patient's sons today.  OOB. Therapy evals - inpatient rehabilitation consult has been ordered.  TCD to eval intracranial vessels - not sure if these have been ordered.  Continue septra for UTI - seven-day course started 02/21/2013  Delton See PA-C [facility] Pager [phone] 02/23/2013, 9:30 AM  I have personally obtained a history, examined the patient, evaluated imaging results, and formulated the assessment and plan of care. I agree with the above. Appreciate Dr. Anne Fu imput re: anticoagulation. We have decided to start elquis (apixaban) for long term anticoagulation for atrial fibrillation and recurrent TIA/stroke. Patient and son agree with plan. She will need close supervision and assistance going forward to minimize her fall risk.  Suanne Marker, MD 02/23/2013, 9:29 AM Certified in Neurology, Neurophysiology and Neuroimaging [facility] - Stroke Team  Please refer to [email or web address] for on-call Stroke MD

## 2013-02-23 NOTE — Progress Notes (Signed)
Subjective:  Speaking more clearly, no chest pain, no shortness of breath.  Objective:  Vital Signs in the last 24 hours: Temp:  [97.6 F (36.4 C)-98.8 F (37.1 C)] 98.8 F (37.1 C) (03/23 0902) Pulse Rate:  [50-69] 69 (03/23 0902) Resp:  [16-18] 18 (03/23 0902) BP: (139-176)/(48-91) 176/91 mmHg (03/23 0902) SpO2:  [94 %-100 %] 100 % (03/23 0902) Weight:  [65.409 kg (144 lb 3.2 oz)] 65.409 kg (144 lb 3.2 oz) (03/22 1540)  Intake/Output from previous day: 03/22 0701 - 03/23 0700 In: 1475 [P.O.:950; I.V.:525] Out: 400 [Urine:400]   Physical Exam: General: Well developed, well nourished, in no acute distress. Head:  Normocephalic and atraumatic. Lungs: Clear to auscultation and percussion. Heart: Normal S1 and S2, frequent ectopy.  No murmur, rubs or gallops.  Abdomen: soft, non-tender, positive bowel sounds. Extremities: No clubbing or cyanosis. No edema. Neurologic: Alert and oriented x 3.    Lab Results:  Recent Labs  02/20/13 1613 02/20/13 1626  WBC 5.6  --   HGB 10.2* 11.2*  PLT 202  --     Recent Labs  02/20/13 1613 02/20/13 1626  NA 139 140  K 3.7 3.7  CL 103 105  CO2 25  --   GLUCOSE 94 92  BUN 15 16  CREATININE 1.10 1.20*    Recent Labs  02/20/13 1614  TROPONINI <0.30   Hepatic Function Panel  Recent Labs  02/20/13 1613  PROT 6.4  ALBUMIN 3.2*  AST 18  ALT 8  ALKPHOS 76  BILITOT 0.2*    Recent Labs  02/21/13 0305  CHOL 166    Imaging: Ct Head Wo Contrast  02/21/2013  *RADIOLOGY REPORT*  Clinical Data: 24 hours post t-PA  CT HEAD WITHOUT CONTRAST  Technique:  Contiguous axial images were obtained from the base of the skull through the vertex without contrast.  Comparison: CT 02/20/2013  Findings: Moderate atrophy.  Mild chronic microvascular ischemia in the white matter, unchanged.  No acute infarct.  Negative for intracranial hemorrhage or mass.  Chronic sinusitis  IMPRESSION: Atrophy and chronic microvascular ischemia.  No acute  abnormality. Negative for hemorrhage.   Original Report Authenticated By: Janeece Riggers, M.D.    Personally viewed.   Telemetry: SR with PAC's Personally viewed.    Assessment/Plan:  79 year old with recurrent stroke/TIA after discontinuation of warfarin do to fear of bleeding because of gait instability/falls with pacemaker.  -Had a lengthy discussion with she and her son. Discussed the risks and benefits of anticoagulation. She is at high risk for repeat stroke event. After discussion of possible bleeding side effect, intracranial hemorrhage for instance, they have decided to move forward with anticoagulation, apixiban.   I will start 5 mg twice a day (creatinine less than 1.5, weight greater than 60 kg). Monitor closely for any signs of bleeding.  I will discuss with neurology to ensure that we're able to give dose now.  I will discontinue both aspirin and Plavix given new start Apixaban.  Dr. Eldridge Dace is her primary cardiologist. I will relay to him.  -Currently sinus rhythm with PACs.  Kym Fenter 02/23/2013, 10:29 AM

## 2013-02-24 DIAGNOSIS — I6340 Cerebral infarction due to embolism of unspecified cerebral artery: Secondary | ICD-10-CM

## 2013-02-24 DIAGNOSIS — D649 Anemia, unspecified: Secondary | ICD-10-CM

## 2013-02-24 LAB — CBC
HCT: 31.5 % — ABNORMAL LOW (ref 36.0–46.0)
Hemoglobin: 9.9 g/dL — ABNORMAL LOW (ref 12.0–15.0)
RBC: 3.57 MIL/uL — ABNORMAL LOW (ref 3.87–5.11)
WBC: 9.2 10*3/uL (ref 4.0–10.5)

## 2013-02-24 LAB — BASIC METABOLIC PANEL
BUN: 8 mg/dL (ref 6–23)
CO2: 26 mEq/L (ref 19–32)
Chloride: 103 mEq/L (ref 96–112)
GFR calc non Af Amer: 63 mL/min — ABNORMAL LOW (ref >90)
Glucose, Bld: 133 mg/dL — ABNORMAL HIGH (ref 70–99)
Potassium: 3.6 mEq/L (ref 3.5–5.1)
Sodium: 140 mEq/L (ref 135–145)

## 2013-02-24 NOTE — Evaluation (Signed)
Occupational Therapy Evaluation Patient Details Name: [name] MRN: [id number] DOB: Nov 15, 1931 Today's Date: 02/24/2013 Time: 5621-3086 OT Time Calculation (min): 35 min  OT Assessment / Plan / Recommendation Clinical Impression  This 79 yo female admitted with suspected left brain CVA (unable to do MRI due to pacemaker) presents to acute OT with problems below. WIll benefit from acute OT with follow up on CIR.    OT Assessment  Patient needs continued OT Services    Follow Up Recommendations  CIR    Barriers to Discharge None    Equipment Recommendations  None recommended by OT       Frequency  Min 2X/week    Precautions / Restrictions Precautions Precautions: Fall Precaution Comments: h/o recurrent falls at home Restrictions Weight Bearing Restrictions: No       ADL  Eating/Feeding: Simulated;Independent Where Assessed - Eating/Feeding: Chair Grooming: Simulated;Set up Where Assessed - Grooming: Unsupported sitting Upper Body Bathing: Simulated;Set up Where Assessed - Upper Body Bathing: Unsupported sitting Lower Body Bathing: Simulated;Minimal assistance (with Mod A maintaining standing ) Where Assessed - Lower Body Bathing: Supported sit to stand Upper Body Dressing: Simulated;Set up Where Assessed - Upper Body Dressing: Unsupported sitting Lower Body Dressing: Minimal assistance;Simulated (with Mod A to maintain standing) Where Assessed - Lower Body Dressing: Supported sit to stand Toilet Transfer: Performed;Moderate assistance Toilet Transfer Method: Stand pivot Acupuncturist: Materials engineer and Hygiene: Performed;Maximal assistance Where Assessed - Engineer, mining and Hygiene: Standing Equipment Used: Rolling walker Transfers/Ambulation Related to ADLs: Min A sit to stand and stand to sit, with Mod A for ambulation for turning from bed to 3n1 and then 3n1 to recliner ADL Comments: Pt  needed to get to the [facility] upon arrival. reports that she had diarrhea most of the night and day as well as emesis. While standing to get clean pt said she was getting nauseated (she sat down and threw up green bile and then had the dry heaves)    OT Diagnosis: Generalized weakness  OT Problem List: Decreased strength;Decreased activity tolerance;Decreased safety awareness;Decreased knowledge of use of DME or AE;Impaired UE functional use OT Treatment Interventions: Self-care/ADL training;Therapeutic activities;Patient/family education;DME and/or AE instruction;Balance training (safety awareness)   OT Goals Acute Rehab OT Goals OT Goal Formulation: With patient Time For Goal Achievement: 03/10/13 Potential to Achieve Goals: Good ADL Goals Pt Will Perform Grooming: with set-up;with supervision;Unsupported;Standing at sink (1 task) ADL Goal: Grooming - Progress: Goal set today Pt Will Perform Lower Body Dressing: with set-up;with supervision;Sit to stand from chair;Sit to stand from bed;Unsupported ADL Goal: Lower Body Dressing - Progress: Goal set today Pt Will Transfer to Toilet: with supervision;with DME;Comfort height toilet;Grab bars ADL Goal: Toilet Transfer - Progress: Goal set today Pt Will Perform Toileting - Clothing Manipulation: with supervision;Standing ADL Goal: Toileting - Clothing Manipulation - Progress: Goal set today Pt Will Perform Toileting - Hygiene: with supervision;Sit to stand from 3-in-1/toilet ADL Goal: Toileting - Hygiene - Progress: Goal set today Miscellaneous OT Goals Miscellaneous OT Goal #1: Pt will be S to get in and OOB for BADLs OT Goal: Miscellaneous Goal #1 - Progress: Goal set today Miscellaneous OT Goal #2: Pt will demonstrate safe hand placement for transfers OT Goal: Miscellaneous Goal #2 - Progress: Goal set today  Visit Information  Last OT Received On: 02/24/13 Assistance Needed: +1    Subjective Data  Subjective: I think I am going to rehab  here if I qualify   Prior  Functioning     Home Living Lives With: Son Available Help at Discharge: Family;Available PRN/intermittently Type of Home: House Home Access: Stairs to enter [employer] of Steps: 4 Entrance Stairs-Rails: Right Home Layout: Two level Alternate Level Stairs-Number of Steps: 14 Alternate Level Stairs-Rails: Can reach both;Right;Left Bathroom Shower/Tub: Walk-in shower;Door [employer] Toilet: Standard Home Adaptive Equipment: Walker - rolling;Bedside commode/3-in-1;Shower chair with back Additional Comments: pt reports she has several RWs at home Prior Function Level of Independence: Needs assistance Needs Assistance: Meal Prep;Light Housekeeping Meal Prep: Total Light Housekeeping: Total Able to Take Stairs?: Yes Driving: No Vocation: Retired Comments: was receiving HHPT PTA Communication Communication: No difficulties Dominant Hand: Right         Vision/Perception Vision - History Baseline Vision: Wears glasses for distance only Visual History: Cataracts (Bil removed) Patient Visual Report: No change from baseline Vision - Assessment Eye Alignment: Within Functional Limits Vision Assessment: Vision tested Ocular Range of Motion: Within Functional Limits Alignment/Gaze Preference: Within Defined Limits Tracking/Visual Pursuits: Able to track stimulus in all quads without difficulty Convergence: Within functional limits Visual Fields: No apparent deficits   Cognition  Cognition Overall Cognitive Status: Appears within functional limits for tasks assessed/performed Arousal/Alertness: Awake/alert Orientation Level: Oriented X4 / Intact Behavior During Session: WFL for tasks performed Current Attention Level: Selective    Extremity/Trunk Assessment Right Upper Extremity Assessment RUE ROM/Strength/Tone: Within functional levels Left Upper Extremity Assessment LUE ROM/Strength/Tone: Deficits LUE ROM/Strength/Tone Deficits:  Decreased AROM and PROM at shoulder due to old shoulder surgery     Mobility Bed Mobility Bed Mobility: Rolling Right;Right Sidelying to Sit;Sitting - Scoot to [employer] of Bed Rolling Right: 4: Min assist Right Sidelying to Sit: 3: Mod assist Left Sidelying to Sit: 4: Min assist Details for Bed Mobility Assistance: Initally pt just tried to throw her legs over the bed and sit up, unsuccessful ("Boy am I weak"). Had her then try to roll to the right and then sit up. Transfers Transfers: Sit to Stand;Stand to Sit Sit to Stand: 4: Min assist;With upper extremity assist;From bed Stand to Sit: 4: Min assist;With upper extremity assist;With armrests;To chair/3-in-1 Details for Transfer Assistance: VCs for safe hand placement           End of Session OT - End of Session Activity Tolerance: Patient limited by fatigue Patient left: in chair;with call bell/phone within reach Nurse Communication:  (Pt with emesis and dry heaves)       Evette Georges [phone] 02/24/2013, 2:59 PM

## 2013-02-24 NOTE — Progress Notes (Signed)
Met with patient at bedside and talked with her son by phone to discuss possible CIR admission. Pt would benefit from inpatient rehab prior to returning home. Pt's son is her caregiver and is available 24/7. He reports that pt has become increasingly weak and has fallen at home. The bedroom and full bath are upstairs and he assists pt to get up/down stairs once daily. He is eager for pt to come to CIR and pt is in agreement. They are aware that admission is pending bed availability. Will f/u in AM. Call [phone] for questions.

## 2013-02-24 NOTE — Progress Notes (Signed)
Stroke Team Progress Note  HISTORY [name] is a 79 y.o. female who was in her normal state at 2:45 pm 02/20/2013, then was seen by her son to not be able to speak around 3:15 pm.  Patient asked her son to make her toast. After making, son noticed her speaking jibberish and didn't know his name - called him her brother Robert's name. Started improving on 02/20/13 evening. This has happened two times before, but both were transient. She received TPA for this event on 02/20/13.  She was admitted to the neuro ICU for further evaluation and treatment.  SUBJECTIVE Lying in bed. No family at bedside.  OBJECTIVE Most recent Vital Signs: Filed Vitals:   02/23/13 1900 02/23/13 2226 02/24/13 0635 02/24/13 0946  BP: 169/65 153/72 144/50 178/62  Pulse: 71 66 65 66  Temp: 98.5 F (36.9 C) 98.3 F (36.8 C) 98 F (36.7 C) 98.2 F (36.8 C)  TempSrc: Oral Oral Oral Oral  Resp: 20 19 19    Height:      Weight:      SpO2: 96% 97% 96%    CBG (last 3)  No results found for this basename: GLUCAP,  in the last 72 hours  IV Fluid Intake:      MEDICATIONS  . allopurinol  100 mg Oral BID  . apixaban  5 mg Oral BID  . calcitRIOL  0.25 mcg Oral q morning - 10a  . DULoxetine  60 mg Oral q morning - 10a  . levothyroxine  25 mcg Oral q morning - 10a  . multivitamin with minerals  1 tablet Oral q morning - 10a  . omega-3 acid ethyl esters  1 g Oral q morning - 10a  . pantoprazole  40 mg Oral BID  . simvastatin  40 mg Oral QPM  . sotalol  120 mg Oral BID  . sulfamethoxazole-trimethoprim  1 tablet Oral Q12H   PRN:  acetaminophen, labetalol, ondansetron (ZOFRAN) IV  Diet:  Cardiac with thin liquids Activity: activity as tolerated DVT Prophylaxis:  SCDs   CLINICALLY SIGNIFICANT STUDIES Basic Metabolic Panel:   Recent Labs Lab 02/20/13 1613 02/20/13 1626 02/24/13 0845  NA 139 140 140  K 3.7 3.7 3.6  CL 103 105 103  CO2 25  --  26  GLUCOSE 94 92 133*  BUN 15 16 8   CREATININE 1.10 1.20*  0.85  CALCIUM 9.6  --  9.8   Liver Function Tests:   Recent Labs Lab 02/20/13 1613  AST 18  ALT 8  ALKPHOS 76  BILITOT 0.2*  PROT 6.4  ALBUMIN 3.2*   CBC:   Recent Labs Lab 02/20/13 1613 02/20/13 1626 02/24/13 0845  WBC 5.6  --  9.2  NEUTROABS 2.3  --   --   HGB 10.2* 11.2* 9.9*  HCT 32.2* 33.0* 31.5*  MCV 88.0  --  88.2  PLT 202  --  236   Coagulation:   Recent Labs Lab 02/20/13 1613  LABPROT 12.2  INR 0.91   Cardiac Enzymes:   Recent Labs Lab 02/20/13 1614  TROPONINI <0.30   Urinalysis:   Recent Labs Lab 02/21/13 0038  COLORURINE YELLOW  LABSPEC 1.019  PHURINE 5.5  GLUCOSEU NEGATIVE  HGBUR SMALL*  BILIRUBINUR NEGATIVE  KETONESUR NEGATIVE  PROTEINUR 100*  UROBILINOGEN 0.2  NITRITE NEGATIVE  LEUKOCYTESUR MODERATE*   Lipid Panel    Component Value Date/Time   CHOL 166 02/21/2013 0305   TRIG 232* 02/21/2013 0305   HDL 35* 02/21/2013 0305  CHOLHDL 4.7 02/21/2013 0305   VLDL 46* 02/21/2013 0305   LDLCALC 85 02/21/2013 0305   HgbA1C  Lab Results  Component Value Date   HGBA1C 6.1* 02/21/2013    Urine Drug Screen:     Component Value Date/Time   LABOPIA POSITIVE* 02/21/2013 0038   COCAINSCRNUR NONE DETECTED 02/21/2013 0038   LABBENZ POSITIVE* 02/21/2013 0038   AMPHETMU NONE DETECTED 02/21/2013 0038   THCU NONE DETECTED 02/21/2013 0038   LABBARB POSITIVE* 02/21/2013 0038    Alcohol Level:   Recent Labs Lab 02/20/13 1613  ETH <11   CT of the brain  02/21/2013 Stable. Moderate difuse atrophy and chronic small vessel ischemic disease  02/20/2013  1.  No acute intracranial abnormality.  2.  MODERATE stable cerebral atrophy, ventriculomegaly and periventricular white matter disease. 3.  Chronic right maxillary sinus disease, right ethmoid disease, and chronic right mastoid effusion.  MRI/A of the brain  Cannot due to pacemaker  2D Echocardiogram  ejection fraction 65-70% - no cardiac source of emboli was identified.  Carotid Doppler   Prelim  - No evidence of hemodynamically significant internal carotid artery stenosis. Vertebral artery flow is antegrade.   CXR  02/20/2013  Stable cardiomegaly. Stable chronic elevation of the right hemidiaphragm with chronic scar/atelectasis in the right lower lobe.  No acute cardiopulmonary disease.     EKG  atrial fibrillation, rate 63.   Therapy Recommendations - inpatient rehabilitation consult recommended  Physical Exam   General - pleasantly confused 79 year old female in no acute distress. Heart - irregularly irregular rhythm - no murmer Lungs - Clear to auscultation Abdomen - Soft - non tender Extremities - Distal pulses intact - no edema Skin - Warm and dry   NEUROLOGIC: MENTAL STATUS: awake, alert, language fluent, comprehension intact, naming intact; NO FRONTAL RELEASE SIGNS. CRANIAL NERVE: pupils equal and reactive to light, visual fields full to confrontation, extraocular muscles intact, no nystagmus, facial sensation and strength symmetric; EXCEPT SLIGHTLY DECR LEFT LOWER FACIAL STRENGTH, uvula midline, shoulder shrug symmetric, tongue midline. MOTOR: normal bulk and tone, DIFFUSE 4/5 strength in the BUE, BLE; LIMITED IN LEFT ARM DUE TO SHOULDER PAIN.  SENSORY: normal and symmetric to light touch COORDINATION: SLOW FTN BILATERALLY REFLEXES: deep tendon reflexes present and symmetric GAIT: STOOPED POSTURE   ASSESSMENT Ms. [name] is a 79 y.o. female presenting with inability to speak. Status post IV t-PA 02/20/2013 at 1645. Left brain stroke suspected. Unable to have MRI secondary to pacemaker. Infarct, if present, likely embolic secondary to known atrial fibrillation.  On clopidogrel 75 mg orally every day prior to admission. No on Apixaban for secondary stroke prevention.  atrial fibrillation. Had been on warfarin in the past, but stopped due to fall risk Hypertension Hyperlipidemia, LDL 85, on statin PTA, on statin now, at goal LDL < 100 Dysphagia PTA Hx  TIA HgbA1c - 6.1 UDS positive for opiates, benzo and barbiturates. On fiornal, diazepam and tylenol #4 most every night for back pain. Son dispenses her medications at night. Positive UTI on seven-day course of Septra started 02/21/2013 Permanent pacemaker - no MRI Anemia - improving - recheck tomorrow Mild renal insufficiency - recheck tomorrow  Hospital day # 4  TREATMENT/PLAN  Continue  apixaban daily for secondary stroke prevention  Too high level for inpatient rehabilitation. Home health recommended  TCD to eval intracranial vessels  Post round entry  - son [name] does not feel he can handle patient at home. Prefers CIR prior to return home. Dr. Pearlean Brownie  discussed with him. Await CIR input.  Annie Main, MSN, RN, ANVP-BC, ANP-BC, [facility] Pager: [phone] 02/24/2013 11:20 AM  I have personally obtained a history, examined the patient, evaluated imaging results, and formulated the assessment and plan of care. I agree with the above. Delia Heady, MD

## 2013-02-24 NOTE — Consult Note (Signed)
Physical Medicine and Rehabilitation Consult Reason for Consult:CVA Referring Physician: Dr. Pearlean Brownie   HPI: [name] is a 79 y.o. right-handed female with history of PAF/tachybradycardia syndrome who had initially been on anti-coagulation with Coumadin years ago but due to unsteady gait and risk of falling Coumadin was discontinued. Admitted 02/20/2013 with slurred speech. Cranial CT scan was negative for acute abnormalities. Patient did receive TPA. MRI of the brain not done secondary to pacemaker. Echocardiogram with ejection fraction of 70% without embolism. Carotid Dopplers with no ICA stenosis. EKG did show atrial fibrillation. Neurology followup question recurrent stroke versus TIA. Patient presently maintained on Eliquis for atrial fibrillation as well as stroke prophylaxis. Urine study did show greater than 100,000 group B(S.Agalactiae) and placed on Septra. Await physical and occupational therapy evaluations. M.D. is requested physical medicine rehabilitation consult to consider inpatient rehabilitation services.   Review of Systems  Cardiovascular: Positive for palpitations.  Gastrointestinal: Positive for nausea, vomiting and diarrhea.       Reflux  Genitourinary:       Recurrent UTI  Musculoskeletal: Positive for myalgias.  Neurological: Positive for tingling and weakness.  All other systems reviewed and are negative.   Past Medical History  Diagnosis Date  . Hypertension   . Hyperlipidemia   . GERD (gastroesophageal reflux disease)   . Dysphagia     appt with Molly Maduro Buccini upcoming  . Chronic recurrent sinusitis   . Recurrent UTI   . Atrial fibrillation     ???  . Arthritis   . TIA (transient ischemic attack)    Past Surgical History  Procedure Laterality Date  . Pacemaker insertion    . Back surgery  1985  . Shoulder surgery  2008    Left  (Dr. Ranell Patrick)  . Total knee arthroplasty  2000, 2001    bilateral (Dr. Despina Hick), left 2000, right 2001  . Cataract  extraction  1986, 2003    left 1986, right 2003  . Vaginal hysterectomy  1969  . Bilateral salpingoophorectomy  1971  . Tonsillectomy  1951  . Appendectomy  1952  . Mastoidectomy  1970  . Sinus exploration  1972  . Knee arthroscopy  1988, 1995, 2000  . Hemorrhoid surgery  2009  . Repair / reconstruction interphalangeal joint  2004    R thumb  . Cystoscopy  1992  . Insert / replace / remove pacemaker     No family history on file. Social History:  reports that she quit smoking about 31 years ago. Her smoking use included Cigarettes. She has a 15 pack-year smoking history. She does not have any smokeless tobacco history on file. She reports that she does not drink alcohol or use illicit drugs. Allergies: No Known Allergies Medications Prior to Admission  Medication Sig Dispense Refill  . acetaminophen (TYLENOL) 325 MG tablet Take 325 mg by mouth every 6 (six) hours as needed for pain.      Marland Kitchen allopurinol (ZYLOPRIM) 100 MG tablet Take 100 mg by mouth 2 (two) times daily.      . butalbital-aspirin-caffeine (FIORINAL) 50-325-40 MG per capsule Take 1 capsule by mouth 2 (two) times daily as needed for pain or headache.      . calcitRIOL (ROCALTROL) 0.25 MCG capsule Take 0.25 mcg by mouth every morning.       . clopidogrel (PLAVIX) 75 MG tablet Take 75 mg by mouth daily with breakfast.      . CRANBERRY PO Take 2 capsules by mouth 2 (two) times daily.      Marland Kitchen  dextromethorphan-guaiFENesin (MUCINEX DM) 30-600 MG per 12 hr tablet Take 1 tablet by mouth every 12 (twelve) hours.      . DULoxetine (CYMBALTA) 60 MG capsule Take 60 mg by mouth every morning.       Marland Kitchen ipratropium (ATROVENT) 0.06 % nasal spray Place 2 sprays into the nose every morning.      Marland Kitchen levothyroxine (SYNTHROID, LEVOTHROID) 25 MCG tablet Take 25 mcg by mouth every morning.       Marland Kitchen lisinopril (PRINIVIL,ZESTRIL) 20 MG tablet Take 20 mg by mouth daily.      . Multiple Vitamin (MULTIVITAMIN WITH MINERALS) TABS Take 1 tablet by mouth  every morning.       Marland Kitchen omega-3 acid ethyl esters (LOVAZA) 1 G capsule Take 1 g by mouth every morning.       . pantoprazole (PROTONIX) 40 MG tablet Take 40 mg by mouth 2 (two) times daily.      . potassium chloride (K-DUR) 10 MEQ tablet Take 20 mEq by mouth 2 (two) times daily.      . simvastatin (ZOCOR) 40 MG tablet Take 40 mg by mouth every evening.      . sotalol (BETAPACE) 120 MG tablet Take 120 mg by mouth 2 (two) times daily.      Marland Kitchen tolterodine (DETROL LA) 4 MG 24 hr capsule Take 4 mg by mouth at bedtime.        Home: Home Living Lives With: Son Available Help at Discharge: Available PRN/intermittently Type of Home: House Home Access: Stairs to enter Secretary/administrator of Steps: 4 Entrance Stairs-Rails: Right Home Layout: Two level Alternate Level Stairs-Number of Steps: 14 Alternate Level Stairs-Rails: Can reach both;Right;Left Home Adaptive Equipment: Walker - rolling Additional Comments: pt reports she has several RWs at home  Functional History: Prior Function Comments: was recieving home PT before admission.   Functional Status:  Mobility: Bed Mobility Bed Mobility: Rolling Left;Left Sidelying to Sit;Sitting - Scoot to Edge of Bed Rolling Left: 4: Min assist;With rail Left Sidelying to Sit: 4: Min assist;With rails;HOB flat Sitting - Scoot to [place] of Bed: 4: Min assist;With rail Transfers Transfers: Sit to Stand;Stand to Sit Sit to Stand: From elevated surface;With upper extremity assist;3: Mod assist Stand to Sit: 4: Min assist;To chair/3-in-1 Ambulation/Gait Ambulation/Gait Assistance: 3: Mod assist Ambulation Distance (Feet): 10 Feet Assistive device: Rolling walker Ambulation/Gait Assistance Details: mod assist to support trunk for balance, again even with forward gait she tends to lean backwards and lists to the right, kicking the right leg of RW.   Gait Pattern: Step-through pattern;Shuffle;Trunk flexed;Narrow base of support (listing to the right side of  the RW) Gait velocity: less than 1.8 ft/sec which indicats risk for recurrent falls    ADL:    Cognition: Cognition Overall Cognitive Status: Impaired Arousal/Alertness: Awake/alert Orientation Level: Oriented X4 Attention: Selective Selective Attention: Impaired Selective Attention Impairment: Verbal basic;Functional basic Memory: Impaired Memory Impairment: Storage deficit;Retrieval deficit Awareness: Impaired Problem Solving: Impaired Problem Solving Impairment: Verbal basic;Functional basic Behaviors: Perseveration Safety/Judgment: Impaired Cognition Overall Cognitive Status: Impaired Area of Impairment: Attention;Awareness of errors Difficult to assess due to: Other (comment) (expressive difficulties) Arousal/Alertness: Awake/alert Orientation Level: Place;Situation (partially to time, could tell month not year) Behavior During Session: [facility] for tasks performed Current Attention Level: Sustained Attention - Other Comments: decreased sustained attention,  Awareness of Errors: Assistance required to identify errors made Awareness of Errors - Other Comments: needed cues to stay inside of RW, was drifting to the right while walking- pt  unaware that she was doing this.   Cognition - Other Comments: pt talking about one thing and within a minute switches to another unrelated topic.    Blood pressure 144/50, pulse 65, temperature 98 F (36.7 C), temperature source Oral, resp. rate 19, height 5\' 5"  (1.651 m), weight 65.409 kg (144 lb 3.2 oz), SpO2 96.00%. Physical Exam  Vitals reviewed. HENT:  Head: Normocephalic.  Eyes: EOM are normal.  Neck: Neck supple. No thyromegaly present.  Cardiovascular:  Cardiac rate controlled  Pulmonary/Chest: Breath sounds normal. No respiratory distress.  Abdominal: Bowel sounds are normal. She exhibits no distension.  Musculoskeletal: She exhibits no edema.  Neurological: She is alert.  Patient was able to provide place and age. She could  not recall the year. Her speech was mildly slurred. Minimal facial asymmetry. She did follow basic commands. Reasonable insight and awareness. Mild left sided weakness (4-/5 LUE and 4/5 LLE). RUE 4+, RLE 4 to 4+. No sensory deficits.   Skin: Skin is warm and dry.  Psychiatric: She has a normal mood and affect. Her behavior is normal. Judgment and thought content normal.    No results found for this or any previous visit (from the past 24 hour(s)). No results found.  Assessment/Plan: Diagnosis: ?small subcortical infarct vs TIA. 1. Does the need for close, 24 hr/day medical supervision in concert with the patient's rehab needs make it unreasonable for this patient to be served in a less intensive setting? No and Potentially 2. Co-Morbidities requiring supervision/potential complications: afib, htn 3. Due to bladder management, bowel management, safety, skin/wound care, disease management, medication administration and patient education, does the patient require 24 hr/day rehab nursing? No and Potentially 4. Does the patient require coordinated care of a physician, rehab nurse, PT (1-2 hrs/day, 5 days/week) and OT (1-2 hrs/day, 5 days/week) to address physical and functional deficits in the context of the above medical diagnosis(es)? No and Potentially Addressing deficits in the following areas: balance, endurance, locomotion, strength, transferring, bowel/bladder control, bathing, dressing, feeding, grooming and toileting 5. Can the patient actively participate in an intensive therapy program of at least 3 hrs of therapy per day at least 5 days per week? Potentially 6. The potential for patient to make measurable gains while on inpatient rehab is fair 7. Anticipated functional outcomes upon discharge from inpatient rehab are supervision to min assist with PT, supervision to min assist with OT, n/a with SLP. 8. Estimated rehab length of stay to reach the above functional goals is: TBD/ n/a 9. Does  the patient have adequate social supports to accommodate these discharge functional goals? Potentially 10. Anticipated D/C setting: Home 11. Anticipated post D/C treatments: HH therapy 12. Overall Rehab/Functional Prognosis: excellent  RECOMMENDATIONS: This patient's condition is appropriate for continued rehabilitative care in the following setting: HHPT,OT? Patient has agreed to participate in recommended program. Potentially Note that insurance prior authorization may be required for reimbursement for recommended care.  Comment: Pt prefers to go home. I'm not sure she's that far from baseline. Will follow for therapy and medical progress. Rehab RN to follow up.   Ranelle Oyster, MD, [place] [facility]     02/24/2013

## 2013-02-24 NOTE — Progress Notes (Signed)
Physical Therapy Treatment Patient Details Name: [name] MRN: [id number] DOB: 12/31/1930 Today's Date: 02/24/2013 Time: 8657-8469 PT Time Calculation (min): 16 min  PT Assessment / Plan / Recommendation Comments on Treatment Session  Pt OOB mobility con't to be limited by diarrhea and fatigue. Pt remains unsafe to return home and would benefit from CIR to achieve maximal functional recovery. Pt assisted to [facility] and was dependent for hygiene post loose stool.    Follow Up Recommendations  CIR     Does the patient have the potential to tolerate intense rehabilitation     Barriers to Discharge        Equipment Recommendations  None recommended by PT    Recommendations for Other Services    Frequency Min 4X/week   Plan Discharge plan remains appropriate;Frequency remains appropriate    Precautions / Restrictions Precautions Precautions: Fall Precaution Comments: h/o recurrent falls at home Restrictions Weight Bearing Restrictions: No   Pertinent Vitals/Pain Pt c/o sore buttocks from diarrhea    Mobility  Bed Mobility Bed Mobility: Sit to Sidelying Left Rolling Right: 4: Min assist Right Sidelying to Sit: 3: Mod assist Left Sidelying to Sit: 4: Min assist Sit to Sidelying Left: 4: Min assist;HOB flat;With rail Details for Bed Mobility Assistance: assist for LE elevation Transfers Transfers: Sit to Stand;Stand to Sit Sit to Stand: 4: Min assist;With upper extremity assist;From bed Stand to Sit: 4: Min assist;With upper extremity assist;With armrests;To chair/3-in-1 Details for Transfer Assistance: max v/c's for safe hand placement Ambulation/Gait Ambulation/Gait Assistance: 4: Min assist Ambulation Distance (Feet): 12 Feet Assistive device: Rolling walker Ambulation/Gait Assistance Details: pt with excessive trunk flexion, minA to keep walker close to patient, limited by fatigue Gait Pattern: Step-through pattern;Shuffle;Trunk flexed;Narrow base of support Gait  velocity: decreased Stairs: No Modified Rankin (Stroke Patients Only) Pre-Morbid Rankin Score: Moderately severe disability Modified Rankin: Moderately severe disability    Exercises     PT Diagnosis:    PT Problem List:   PT Treatment Interventions:     PT Goals Acute Rehab PT Goals PT Goal: Sit to Stand - Progress: Progressing toward goal PT Goal: Stand to Sit - Progress: Progressing toward goal PT Goal: Ambulate - Progress: Progressing toward goal  Visit Information  Last PT Received On: 02/24/13 Assistance Needed: +1    Subjective Data  Subjective: Pt received sitting up in chair with report "my butt is so sore from being on the commode all night."   Cognition  Cognition Overall Cognitive Status: Appears within functional limits for tasks assessed/performed Arousal/Alertness: Awake/alert Orientation Level: Oriented X4 / Intact Behavior During Session: [facility] for tasks performed Current Attention Level: Selective    Balance  Static Standing Balance Static Standing - Balance Support: Bilateral upper extremity supported Static Standing - Level of Assistance: 4: Min assist Static Standing - Comment/# of Minutes: pt stood x 5 min for hygiene post loose stool  End of Session PT - End of Session Equipment Utilized During Treatment: Gait belt Activity Tolerance: Patient limited by fatigue Patient left: in chair;with call bell/phone within reach;Other (comment) Nurse Communication: Mobility status   GP     Marcene Brawn 02/24/2013, 5:06 PM  Lewis Shock, PT, DPT Pager #: [phone] Office #: [phone]

## 2013-02-24 NOTE — Progress Notes (Signed)
Patient took medications late per her request due to vomiting this am.

## 2013-02-24 NOTE — Progress Notes (Signed)
   CARE MANAGEMENT NOTE 02/24/2013  Patient:  [name], [name]   Account Number:  [id number]  Date Initiated:  02/24/2013  Documentation initiated by:  [facility]  Subjective/Objective Assessment:   TIA     Action/Plan:   HH vs CIR, lives with oldest son   Anticipated DC Date:  02/25/2013   Anticipated DC Plan:  IP REHAB FACILITY      DC Planning Services  CM consult      Choice offered to / List presented to:             Status of service:  In process, will continue to follow Medicare Important Message given?   (If response is "NO", the following Medicare IM given date fields will be blank) Date Medicare IM given:   Date Additional Medicare IM given:    Discharge Disposition:    Per UR Regulation:    If discussed at Long Length of Stay Meetings, dates discussed:    Comments:  02/24/2013 1445 NCM discussed dc plan. States she lives at home with son. She had HH in the past with [facility]. States she is interested in CIR. Will wait PT/OT evaluation for recommendation. Isidoro Donning RN CCM Case Mgmt phone [phone]

## 2013-02-24 NOTE — Progress Notes (Signed)
Pt c/o of stomach and headache,was earlier nauseous and vomited once at shift change,iv zofran 4mg  given,nausea was relieved but now still c/o of stomach pain,Dr Stewart(on call) paged and notified,ordered tab tramadol 50mg  and dulcolax supp same given at 0015,pt reassured,will however continue to monitor. Obasogie-Asidi, Jerett Odonohue Efe

## 2013-02-25 ENCOUNTER — Inpatient Hospital Stay (HOSPITAL_COMMUNITY)
Admission: RE | Admit: 2013-02-25 | Discharge: 2013-03-04 | DRG: 945 | Disposition: A | Discharge status: Home Health | Payer: Medicare Other | Source: Intra-hospital | Attending: Physical Medicine & Rehabilitation | Admitting: Physical Medicine & Rehabilitation

## 2013-02-25 DIAGNOSIS — I10 Essential (primary) hypertension: Secondary | ICD-10-CM

## 2013-02-25 DIAGNOSIS — N390 Urinary tract infection, site not specified: Secondary | ICD-10-CM

## 2013-02-25 DIAGNOSIS — Z8673 Personal history of transient ischemic attack (TIA), and cerebral infarction without residual deficits: Secondary | ICD-10-CM

## 2013-02-25 DIAGNOSIS — R4789 Other speech disturbances: Secondary | ICD-10-CM

## 2013-02-25 DIAGNOSIS — I4891 Unspecified atrial fibrillation: Secondary | ICD-10-CM

## 2013-02-25 DIAGNOSIS — R5381 Other malaise: Secondary | ICD-10-CM

## 2013-02-25 DIAGNOSIS — M109 Gout, unspecified: Secondary | ICD-10-CM

## 2013-02-25 DIAGNOSIS — Z950 Presence of cardiac pacemaker: Secondary | ICD-10-CM

## 2013-02-25 DIAGNOSIS — R1310 Dysphagia, unspecified: Secondary | ICD-10-CM

## 2013-02-25 DIAGNOSIS — I6350 Cerebral infarction due to unspecified occlusion or stenosis of unspecified cerebral artery: Secondary | ICD-10-CM

## 2013-02-25 DIAGNOSIS — I6330 Cerebral infarction due to thrombosis of unspecified cerebral artery: Secondary | ICD-10-CM

## 2013-02-25 DIAGNOSIS — I6340 Cerebral infarction due to embolism of unspecified cerebral artery: Secondary | ICD-10-CM

## 2013-02-25 DIAGNOSIS — J329 Chronic sinusitis, unspecified: Secondary | ICD-10-CM

## 2013-02-25 DIAGNOSIS — I639 Cerebral infarction, unspecified: Secondary | ICD-10-CM

## 2013-02-25 DIAGNOSIS — Z79899 Other long term (current) drug therapy: Secondary | ICD-10-CM

## 2013-02-25 DIAGNOSIS — E039 Hypothyroidism, unspecified: Secondary | ICD-10-CM

## 2013-02-25 DIAGNOSIS — Z7901 Long term (current) use of anticoagulants: Secondary | ICD-10-CM

## 2013-02-25 DIAGNOSIS — K219 Gastro-esophageal reflux disease without esophagitis: Secondary | ICD-10-CM

## 2013-02-25 DIAGNOSIS — H9209 Otalgia, unspecified ear: Secondary | ICD-10-CM

## 2013-02-25 DIAGNOSIS — R29898 Other symptoms and signs involving the musculoskeletal system: Secondary | ICD-10-CM

## 2013-02-25 DIAGNOSIS — E785 Hyperlipidemia, unspecified: Secondary | ICD-10-CM

## 2013-02-25 DIAGNOSIS — G459 Transient cerebral ischemic attack, unspecified: Secondary | ICD-10-CM

## 2013-02-25 DIAGNOSIS — Z96659 Presence of unspecified artificial knee joint: Secondary | ICD-10-CM

## 2013-02-25 DIAGNOSIS — Z5189 Encounter for other specified aftercare: Principal | ICD-10-CM

## 2013-02-25 DIAGNOSIS — Z87891 Personal history of nicotine dependence: Secondary | ICD-10-CM

## 2013-02-25 DIAGNOSIS — J189 Pneumonia, unspecified organism: Secondary | ICD-10-CM | POA: Diagnosis present

## 2013-02-25 DIAGNOSIS — I495 Sick sinus syndrome: Secondary | ICD-10-CM

## 2013-02-25 HISTORY — DX: Anemia, unspecified: D64.9

## 2013-02-25 HISTORY — DX: Hypothyroidism, unspecified: E03.9

## 2013-02-25 MED ORDER — ALLOPURINOL 100 MG PO TABS
100.0000 mg | ORAL_TABLET | Freq: Two times a day (BID) | ORAL | Status: DC
  Administered 2013-02-26 – 2013-03-04 (×13): 100 mg via ORAL
  Filled 2013-02-25 (×15): qty 1

## 2013-02-25 MED ORDER — SULFAMETHOXAZOLE-TRIMETHOPRIM 400-80 MG PO TABS
1.0000 | ORAL_TABLET | Freq: Two times a day (BID) | ORAL | Status: DC
  Administered 2013-02-26 – 2013-03-04 (×13): 1 via ORAL
  Filled 2013-02-25 (×14): qty 1

## 2013-02-25 MED ORDER — SORBITOL 70 % SOLN: 30.0000 mL | Freq: Every day | Status: DC | PRN

## 2013-02-25 MED ORDER — SIMVASTATIN 40 MG PO TABS
40.0000 mg | ORAL_TABLET | Freq: Every evening | ORAL | Status: DC
  Administered 2013-02-26 – 2013-03-03 (×6): 40 mg via ORAL
  Filled 2013-02-25 (×7): qty 1

## 2013-02-25 MED ORDER — ACETAMINOPHEN 325 MG PO TABS
325.0000 mg | ORAL_TABLET | ORAL | Status: DC | PRN
  Administered 2013-02-26 – 2013-03-03 (×11): 650 mg via ORAL
  Filled 2013-02-25 (×11): qty 2

## 2013-02-25 MED ORDER — APIXABAN 5 MG PO TABS
5.0000 mg | ORAL_TABLET | Freq: Two times a day (BID) | ORAL | Status: DC
  Administered 2013-02-26 – 2013-03-04 (×13): 5 mg via ORAL
  Filled 2013-02-25 (×15): qty 1

## 2013-02-25 MED ORDER — DULOXETINE HCL 60 MG PO CPEP
60.0000 mg | ORAL_CAPSULE | Freq: Every morning | ORAL | Status: DC
  Administered 2013-02-26 – 2013-03-04 (×7): 60 mg via ORAL
  Filled 2013-02-25 (×7): qty 1

## 2013-02-25 MED ORDER — ONDANSETRON HCL 4 MG PO TABS
4.0000 mg | ORAL_TABLET | Freq: Four times a day (QID) | ORAL | Status: DC | PRN
  Administered 2013-02-26 – 2013-03-03 (×5): 4 mg via ORAL
  Filled 2013-02-25 (×6): qty 1

## 2013-02-25 MED ORDER — ONDANSETRON HCL 4 MG/2ML IJ SOLN: 4.0000 mg | Freq: Four times a day (QID) | INTRAMUSCULAR | Status: DC | PRN

## 2013-02-25 MED ORDER — PANTOPRAZOLE SODIUM 40 MG PO TBEC
40.0000 mg | DELAYED_RELEASE_TABLET | Freq: Two times a day (BID) | ORAL | Status: DC
  Administered 2013-02-26 – 2013-03-04 (×13): 40 mg via ORAL
  Filled 2013-02-25 (×13): qty 1

## 2013-02-25 MED ORDER — SOTALOL HCL 120 MG PO TABS
120.0000 mg | ORAL_TABLET | Freq: Two times a day (BID) | ORAL | Status: DC
  Administered 2013-02-26 – 2013-03-04 (×13): 120 mg via ORAL
  Filled 2013-02-25 (×15): qty 1

## 2013-02-25 MED ORDER — OMEGA-3-ACID ETHYL ESTERS 1 G PO CAPS
1.0000 g | ORAL_CAPSULE | Freq: Every morning | ORAL | Status: DC
  Administered 2013-03-01: 1 g via ORAL
  Filled 2013-02-25 (×7): qty 1

## 2013-02-25 MED ORDER — CALCITRIOL 0.25 MCG PO CAPS
0.2500 ug | ORAL_CAPSULE | Freq: Every morning | ORAL | Status: DC
  Administered 2013-02-26 – 2013-03-04 (×7): 0.25 ug via ORAL
  Filled 2013-02-25 (×7): qty 1

## 2013-02-25 MED ORDER — ADULT MULTIVITAMIN W/MINERALS CH
1.0000 | ORAL_TABLET | Freq: Every morning | ORAL | Status: DC
  Administered 2013-02-26 – 2013-03-04 (×7): 1 via ORAL
  Filled 2013-02-25 (×7): qty 1

## 2013-02-25 MED ORDER — LEVOTHYROXINE SODIUM 25 MCG PO TABS
25.0000 ug | ORAL_TABLET | Freq: Every day | ORAL | Status: DC
  Administered 2013-02-26 – 2013-03-04 (×7): 25 ug via ORAL
  Filled 2013-02-25 (×8): qty 1

## 2013-02-25 NOTE — PMR Pre-admission (Signed)
PMR Admission Coordinator Pre-Admission Assessment  Patient: [name] is an 79 y.o., female MRN: [id number] DOB: 18-Nov-1931 Height: 5\' 5"  (165.1 cm) Weight: 65.409 kg (144 lb 3.2 oz)              Insurance Information Primary: Medicare Policy#: [id number] Subscriber: Patient Benefits: [place] Effective Date: 08/04/96 Deductible: $1126 OOP Max: None Life Max: Unlimited CIR: 100% SNF: 100 days LBD:  Outpatient: 80% Copay: 20%  Home Health: 100% DME: 80%  Copay: 20% Providers: Patient's choice  SECONDARY: [employer] of [place]      Policy#: [id number]      Subscriber: self  Emergency Contact Information Contact Information   Name Relation Home Work Mobile   [place] [phone]  [phone]     Current Medical History  Patient Admitting Diagnosis: Small subcortical infarct vs TIA  History of Present Illness: 79 y.o. right-handed female with history of PAF/tachybradycardia syndrome who had initially been on anti-coagulation with Coumadin years ago but due to unsteady gait and risk of falling Coumadin was discontinued. Admitted 02/20/2013 with slurred speech. Cranial CT scan was negative for acute abnormalities. Patient did receive TPA. MRI of the brain not done secondary to pacemaker. Echocardiogram with ejection fraction of 70% without embolism. Carotid Dopplers with no ICA stenosis. EKG did show atrial fibrillation. Neurology followup question recurrent stroke versus TIA. Patient presently maintained on Eliquis for atrial fibrillation as well as stroke prophylaxis. Urine study did show greater than 100,000 group B(S.Agalactiae) and placed on Septra.  Total: 0   Past Medical History  Past Medical History  Diagnosis Date  . Hypertension   . Hyperlipidemia   . GERD (gastroesophageal reflux disease)   . Dysphagia     appt with Molly Maduro Buccini upcoming  . Chronic recurrent sinusitis   . Recurrent UTI   . Atrial fibrillation     ???  . Arthritis   . TIA (transient  ischemic attack)     Family History  family history is not on file.  Prior Rehab/Hospitalizations: none   Current Medications  Current facility-administered medications:acetaminophen (TYLENOL) tablet 325 mg, 325 mg, Oral, Q6H PRN, Suanne Marker, MD;  allopurinol (ZYLOPRIM) tablet 100 mg, 100 mg, Oral, BID, Suanne Marker, MD, 100 mg at 02/25/13 7564;  apixaban (ELIQUIS) tablet 5 mg, 5 mg, Oral, BID, Donato Schultz, MD, 5 mg at 02/25/13 0954;  calcitRIOL (ROCALTROL) capsule 0.25 mcg, 0.25 mcg, Oral, q morning - 10a, Suanne Marker, MD, 0.25 mcg at 02/25/13 0954 DULoxetine (CYMBALTA) DR capsule 60 mg, 60 mg, Oral, q morning - 10a, Suanne Marker, MD, 60 mg at 02/25/13 0953;  labetalol (NORMODYNE,TRANDATE) injection 10 mg, 10 mg, Intravenous, Q10 min PRN, Ritta Slot, MD;  levothyroxine (SYNTHROID, LEVOTHROID) tablet 25 mcg, 25 mcg, Oral, q morning - 10a, Suanne Marker, MD, 25 mcg at 02/25/13 0953 multivitamin with minerals tablet 1 tablet, 1 tablet, Oral, q morning - 10a, Suanne Marker, MD, 1 tablet at 02/25/13 0953;  omega-3 acid ethyl esters (LOVAZA) capsule 1 g, 1 g, Oral, q morning - 10a, Suanne Marker, MD, 1 g at 02/25/13 0954;  ondansetron (ZOFRAN) injection 4 mg, 4 mg, Intravenous, Q6H PRN, Ritta Slot, MD, 4 mg at 02/25/13 1225 pantoprazole (PROTONIX) EC tablet 40 mg, 40 mg, Oral, BID, Suanne Marker, MD, 40 mg at 02/25/13 0955;  simvastatin (ZOCOR) tablet 40 mg, 40 mg, Oral, QPM, Vikram R Penumalli, MD, 40 mg at 02/24/13 1846;  sotalol (BETAPACE) tablet 120 mg, 120 mg, Oral,  BID, Suanne Marker, MD, 120 mg at 02/25/13 1610 sulfamethoxazole-trimethoprim (BACTRIM,SEPTRA) 400-80 MG per tablet 1 tablet, 1 tablet, Oral, Q12H, Layne Benton, NP, 1 tablet at 02/25/13 9604  Patients Current Diet: Cardiac  Precautions / Restrictions Precautions Precautions: Fall Precaution Comments: h/o recurrent falls at home Restrictions Weight Bearing  Restrictions: No   Prior Activity Level Community (5-7x/wk): Out for MD appointments only Home Assistive Devices / Equipment Home Assistive Devices/Equipment: Dan Humphreys (specify type) Home Adaptive Equipment: Walker - rolling;Bedside commode/3-in-1;Shower chair with back  Prior Functional Level Prior Function Level of Independence: Needs assistance Needs Assistance: Meal Prep;Light Housekeeping Meal Prep: Total Light Housekeeping: Total Able to Take Stairs?: Yes Driving: No Vocation: Retired Comments: was receiving HHPT PTA  Current Functional Level Cognition  Arousal/Alertness: Awake/alert Overall Cognitive Status: Impaired Overall Cognitive Status: Appears within functional limits for tasks assessed/performed Difficult to assess due to: Other (comment) (expressive difficulties) Current Attention Level: Selective Attention - Other Comments: decreased sustained attention,  Orientation Level: Oriented X4 Awareness of Errors: Assistance required to identify errors made Awareness of Errors - Other Comments: needed cues to stay inside of RW, was drifting to the right while walking- pt unaware that she was doing this.   Cognition - Other Comments: pt talking about one thing and within a minute switches to another unrelated topic.   Attention: Selective Selective Attention: Impaired Selective Attention Impairment: Verbal basic;Functional basic Memory: Impaired Memory Impairment: Storage deficit;Retrieval deficit Awareness: Impaired Problem Solving: Impaired Problem Solving Impairment: Verbal basic;Functional basic Behaviors: Perseveration Safety/Judgment: Impaired    Extremity Assessment (includes Sensation/Coordination)  RUE ROM/Strength/Tone: Within functional levels  RLE ROM/Strength/Tone: Deficits RLE ROM/Strength/Tone Deficits: grossly 4/5 per seated MMT, but no obvious asymmetries left vs right RLE Sensation: WFL - Light Touch    ADLs  Eating/Feeding:  Simulated;Independent Where Assessed - Eating/Feeding: Chair Grooming: Simulated;Set up Where Assessed - Grooming: Unsupported sitting Upper Body Bathing: Simulated;Set up Where Assessed - Upper Body Bathing: Unsupported sitting Lower Body Bathing: Simulated;Minimal assistance (with Mod A maintaining standing ) Where Assessed - Lower Body Bathing: Supported sit to stand Upper Body Dressing: Simulated;Set up Where Assessed - Upper Body Dressing: Unsupported sitting Lower Body Dressing: Minimal assistance;Simulated (with Mod A to maintain standing) Where Assessed - Lower Body Dressing: Supported sit to stand Toilet Transfer: Performed;Moderate assistance Toilet Transfer Method: Stand pivot Acupuncturist: Materials engineer and Hygiene: Performed;Maximal assistance Where Assessed - Engineer, mining and Hygiene: Standing Equipment Used: Rolling walker Transfers/Ambulation Related to ADLs: Min A sit to stand and stand to sit, with Mod A for ambulation for turning from bed to 3n1 and then 3n1 to recliner ADL Comments: Pt needed to get to the [facility] upon arrival. reports that she had diarrhea most of the night and day as well as emesis. While standing to get clean pt said she was getting nauseated (she sat down and threw up green bile and then had the dry heaves)    Mobility  Bed Mobility: Sit to Sidelying Left Rolling Right: 4: Min assist Rolling Left: 4: Min assist;With rail Right Sidelying to Sit: 3: Mod assist Left Sidelying to Sit: 4: Min assist Sitting - Scoot to Edge of Bed: 4: Min assist;With rail Sit to Sidelying Left: 4: Min assist;HOB flat;With rail    Transfers  Transfers: Sit to Stand;Stand to Sit Sit to Stand: 4: Min assist;With upper extremity assist;From bed Stand to Sit: 4: Min assist;With upper extremity assist;With armrests;To chair/3-in-1    Ambulation / Gait /  Stairs / Psychologist, prison and probation services   Ambulation/Gait Ambulation/Gait Assistance: 4: Min Environmental consultant (Feet): 12 Feet Assistive device: Rolling walker Ambulation/Gait Assistance Details: pt with excessive trunk flexion, minA to keep walker close to patient, limited by fatigue Gait Pattern: Step-through pattern;Shuffle;Trunk flexed;Narrow base of support Gait velocity: decreased Stairs: No    Posture / Balance Static Sitting Balance Static Sitting - Balance Support: Bilateral upper extremity supported;Feet supported Static Sitting - Level of Assistance: 5: Stand by assistance Static Sitting - Comment/# of Minutes: with feet unsupported min asssit to prevent posterior LOB Static Standing Balance Static Standing - Balance Support: Bilateral upper extremity supported Static Standing - Level of Assistance: 4: Min assist Static Standing - Comment/# of Minutes: pt stood x 5 min for hygiene post loose stool    Special needs/care consideration Skin: WNL Bowel mgmt: diarrhea x 2 days Bladder mgmt: continent    Previous Home Environment Living Arrangements: Lives With Son Available Help at Discharge: Family;Available PRN/intermittently Type of Home: House Home Layout: Two level Alternate Level Stairs-Rails: Can reach both;Right;Left Alternate Level Stairs-Number of Steps: 14 Home Access: Stairs to enter Entrance Stairs-Rails: Right Entrance Stairs-Number of Steps: 4 Bathroom Shower/Tub: Psychologist, counselling;Door [employer]: Standard Home Care Services: Yes, Aide comes1 day per week  Type of Home Care Services: Home PT;Home OT Home Care Agency (if known): Unsure Additional Comments: pt reports she has several RWs at home  Discharge Living Setting Plans for Discharge Living Setting: House;Lives with son Type of Home at Discharge: House Discharge Home Layout: Two level Alternate Level Stairs-Rails: Can reach both Alternate Level Stairs-Number of Steps: flight Discharge Home Access: Stairs to enter Entrance  Stairs-Rails: Right Entrance Stairs-Number of Steps: 4 Discharge Bathroom Shower/Tub: Walk-in shower Discharge Bathroom Toilet: Standard Discharge Bathroom Accessibility: Yes How Accessible: Accessible via walker Do you have any problems obtaining your medications?: No  Social/Family/Support Systems Patient Roles: Parent Contact Information: [phone] Anticipated Caregiver: Son Anticipated Caregiver's Contact Information: [phone] Ability/Limitations of Caregiver: None Caregiver Availability: 24/7 Discharge Plan Discussed with Primary Caregiver: Yes Is Caregiver In Agreement with Plan?: Yes Does Caregiver/Family have Issues with Lodging/Transportation while Pt is in Rehab?: No  Goals/Additional Needs Patient/Family Goal for Rehab: supervision - minimum assist overall Expected length of stay: 7 days Patient's son reports that she has had problems with balance and a few falls at home.  He assists pt to get downstairs every AM and then upstairs at bedtime. A HH Aide comes once a week to assist pt with bathing.  Cultural Considerations: none Dietary Needs: heart healthy Equipment Needs: TBD Pt/Family Agrees to Admission and willing to participate: Yes Program Orientation Provided & Reviewed with Pt/Caregiver Including Roles  & Responsibilities: Yes  Decrease burden of Care through IP rehab admission: n/a  Possible need for SNF placement upon discharge: No  Patient Condition: This patient's condition remains as documented in the Consult dated 02/24/13, in which the Rehabilitation Physician determined and documented that the patient's condition is appropriate for intensive rehabilitative care in an inpatient rehabilitation facility pending her agreement to CIR, her ability to participate and adequate social supports. These areas have been addressed and will admit pt to CIR today.  Preadmission Screen Completed By:  [name] Dare, 02/25/2013 1:22  PM ______________________________________________________________________   Discussed status with Dr. Riley Kill on 02/25/13 at 1330 and received telephone approval for admission today.  Admission Coordinator:  [name] Dare, time1332/Date 02/25/13

## 2013-02-25 NOTE — Progress Notes (Signed)
Patient to be transferred to [facility] Rehab department was soon as room is available.  IV's out of date and both were D/C.  Awaiting to remove telemetry when bed is ready on 4000.  Report was already called to nurse on 4000.

## 2013-02-25 NOTE — Progress Notes (Signed)
TCD completed. 

## 2013-02-25 NOTE — H&P (Signed)
Physical Medicine and Rehabilitation Admission H&P    Chief Complaint  Patient presents with  . Code Stroke  : HPI: [name] is a 79 y.o. right-handed female with history of PAF/tachybradycardia syndrome who had initially been on anti-coagulation with Coumadin years ago but due to unsteady gait and risk of falling Coumadin was discontinued. Admitted 02/20/2013 with slurred speech. Cranial CT scan was negative for acute abnormalities. Patient did receive TPA. MRI of the brain not done secondary to pacemaker. Echocardiogram with ejection fraction of 70% without embolism. Carotid Dopplers with no ICA stenosis. EKG did show atrial fibrillation. Neurology followup question recurrent stroke versus TIA. Patient presently maintained on Eliquis for atrial fibrillation as well as stroke prophylaxis. Urine study did show greater than 100,000 group B(S.Agalactiae) and placed on Septra. Physical and occupational therapy evaluations completed. M.D. is requested physical medicine rehabilitation consult to consider inpatient rehabilitation services. Patient was felt to be a good candidate for inpatient rehabilitation services and was admitted for comprehensive rehabilitation program  Review of Systems  Cardiovascular: Positive for palpitations.  Gastrointestinal: Positive for nausea, vomiting and diarrhea.  Reflux  Genitourinary:  Recurrent UTI  Musculoskeletal: Positive for myalgias.  Neurological: Positive for tingling and weakness.  All other systems reviewed and are negative   Past Medical History  Diagnosis Date  . Hypertension   . Hyperlipidemia   . GERD (gastroesophageal reflux disease)   . Dysphagia     appt with Molly Maduro Buccini upcoming  . Chronic recurrent sinusitis   . Recurrent UTI   . Atrial fibrillation     ???  . Arthritis   . TIA (transient ischemic attack)    Past Surgical History  Procedure Laterality Date  . Pacemaker insertion    . Back surgery  1985  . Shoulder  surgery  2008    Left  (Dr. Ranell Patrick)  . Total knee arthroplasty  2000, 2001    bilateral (Dr. Despina Hick), left 2000, right 2001  . Cataract extraction  1986, 2003    left 1986, right 2003  . Vaginal hysterectomy  1969  . Bilateral salpingoophorectomy  1971  . Tonsillectomy  1951  . Appendectomy  1952  . Mastoidectomy  1970  . Sinus exploration  1972  . Knee arthroscopy  1988, 1995, 2000  . Hemorrhoid surgery  2009  . Repair / reconstruction interphalangeal joint  2004    R thumb  . Cystoscopy  1992  . Insert / replace / remove pacemaker     No family history on file. Social History:  reports that she quit smoking about 31 years ago. Her smoking use included Cigarettes. She has a 15 pack-year smoking history. She does not have any smokeless tobacco history on file. She reports that she does not drink alcohol or use illicit drugs. Allergies: No Known Allergies Medications Prior to Admission  Medication Sig Dispense Refill  . acetaminophen (TYLENOL) 325 MG tablet Take 325 mg by mouth every 6 (six) hours as needed for pain.      Marland Kitchen allopurinol (ZYLOPRIM) 100 MG tablet Take 100 mg by mouth 2 (two) times daily.      . butalbital-aspirin-caffeine (FIORINAL) 50-325-40 MG per capsule Take 1 capsule by mouth 2 (two) times daily as needed for pain or headache.      . calcitRIOL (ROCALTROL) 0.25 MCG capsule Take 0.25 mcg by mouth every morning.       . clopidogrel (PLAVIX) 75 MG tablet Take 75 mg by mouth daily with breakfast.      .  CRANBERRY PO Take 2 capsules by mouth 2 (two) times daily.      Marland Kitchen dextromethorphan-guaiFENesin (MUCINEX DM) 30-600 MG per 12 hr tablet Take 1 tablet by mouth every 12 (twelve) hours.      . DULoxetine (CYMBALTA) 60 MG capsule Take 60 mg by mouth every morning.       Marland Kitchen ipratropium (ATROVENT) 0.06 % nasal spray Place 2 sprays into the nose every morning.      Marland Kitchen levothyroxine (SYNTHROID, LEVOTHROID) 25 MCG tablet Take 25 mcg by mouth every morning.       Marland Kitchen lisinopril  (PRINIVIL,ZESTRIL) 20 MG tablet Take 20 mg by mouth daily.      . Multiple Vitamin (MULTIVITAMIN WITH MINERALS) TABS Take 1 tablet by mouth every morning.       Marland Kitchen omega-3 acid ethyl esters (LOVAZA) 1 G capsule Take 1 g by mouth every morning.       . pantoprazole (PROTONIX) 40 MG tablet Take 40 mg by mouth 2 (two) times daily.      . potassium chloride (K-DUR) 10 MEQ tablet Take 20 mEq by mouth 2 (two) times daily.      . simvastatin (ZOCOR) 40 MG tablet Take 40 mg by mouth every evening.      . sotalol (BETAPACE) 120 MG tablet Take 120 mg by mouth 2 (two) times daily.      Marland Kitchen tolterodine (DETROL LA) 4 MG 24 hr capsule Take 4 mg by mouth at bedtime.        Home: Home Living Lives With: Son Available Help at Discharge: Family;Available PRN/intermittently Type of Home: House Home Access: Stairs to enter [employer] of Steps: 4 Entrance Stairs-Rails: Right Home Layout: Two level Alternate Level Stairs-Number of Steps: 14 Alternate Level Stairs-Rails: Can reach both;Right;Left Bathroom Shower/Tub: Walk-in shower;Door [employer] Toilet: Standard Home Adaptive Equipment: Walker - rolling;Bedside commode/3-in-1;Shower chair with back Additional Comments: pt reports she has several RWs at home   Functional History: Prior Function Meal Prep: Total Light Housekeeping: Total Able to Take Stairs?: Yes Driving: No Vocation: Retired Comments: was receiving HHPT PTA  Functional Status:  Mobility: Bed Mobility Bed Mobility: Sit to Sidelying Left Rolling Right: 4: Min assist Rolling Left: 4: Min assist;With rail Right Sidelying to Sit: 3: Mod assist Left Sidelying to Sit: 4: Min assist Sitting - Scoot to Edge of Bed: 4: Min assist;With rail Sit to Sidelying Left: 4: Min assist;HOB flat;With rail Transfers Transfers: Sit to Stand;Stand to Sit Sit to Stand: 4: Min assist;With upper extremity assist;From bed Stand to Sit: 4: Min assist;With upper extremity assist;With armrests;To  chair/3-in-1 Ambulation/Gait Ambulation/Gait Assistance: 4: Min assist Ambulation Distance (Feet): 12 Feet Assistive device: Rolling walker Ambulation/Gait Assistance Details: pt with excessive trunk flexion, minA to keep walker close to patient, limited by fatigue Gait Pattern: Step-through pattern;Shuffle;Trunk flexed;Narrow base of support Gait velocity: decreased Stairs: No    ADL: ADL Eating/Feeding: Simulated;Independent Where Assessed - Eating/Feeding: Chair Grooming: Simulated;Set up Where Assessed - Grooming: Unsupported sitting Upper Body Bathing: Simulated;Set up Where Assessed - Upper Body Bathing: Unsupported sitting Lower Body Bathing: Simulated;Minimal assistance (with Mod A maintaining standing ) Where Assessed - Lower Body Bathing: Supported sit to stand Upper Body Dressing: Simulated;Set up Where Assessed - Upper Body Dressing: Unsupported sitting Lower Body Dressing: Minimal assistance;Simulated (with Mod A to maintain standing) Where Assessed - Lower Body Dressing: Supported sit to stand Toilet Transfer: Performed;Moderate assistance Toilet Transfer Method: Stand pivot Toilet Transfer Equipment: Bedside commode Equipment Used: Rolling walker Transfers/Ambulation  Related to ADLs: Min A sit to stand and stand to sit, with Mod A for ambulation for turning from bed to 3n1 and then 3n1 to recliner ADL Comments: Pt needed to get to the [facility] upon arrival. reports that she had diarrhea most of the night and day as well as emesis. While standing to get clean pt said she was getting nauseated (she sat down and threw up green bile and then had the dry heaves)  Cognition: Cognition Overall Cognitive Status: Impaired Arousal/Alertness: Awake/alert Orientation Level: Oriented X4 Attention: Selective Selective Attention: Impaired Selective Attention Impairment: Verbal basic;Functional basic Memory: Impaired Memory Impairment: Storage deficit;Retrieval deficit Awareness:  Impaired Problem Solving: Impaired Problem Solving Impairment: Verbal basic;Functional basic Behaviors: Perseveration Safety/Judgment: Impaired Cognition Overall Cognitive Status: Appears within functional limits for tasks assessed/performed Area of Impairment: Attention;Awareness of errors Difficult to assess due to: Other (comment) (expressive difficulties) Arousal/Alertness: Awake/alert Orientation Level: Oriented X4 / Intact Behavior During Session: WFL for tasks performed Current Attention Level: Selective Attention - Other Comments: decreased sustained attention,  Awareness of Errors: Assistance required to identify errors made Awareness of Errors - Other Comments: needed cues to stay inside of RW, was drifting to the right while walking- pt unaware that she was doing this.   Cognition - Other Comments: pt talking about one thing and within a minute switches to another unrelated topic.    Physical Exam: Blood pressure 149/55, pulse 66, temperature 98 F (36.7 C), temperature source Oral, resp. rate 18, height 5\' 5"  (1.651 m), weight 65.409 kg (144 lb 3.2 oz), SpO2 100.00%. Physical Exam  Vitals reviewed.  HENT:  Head: Normocephalic.  Eyes: EOM are normal. anicteric Neck: Neck supple. No thyromegaly present. No jvd or lymphadenopathy Cardiovascular:  Cardiac rate controlled  Pulmonary/Chest: Breath sounds normal. No respiratory distress.  Abdominal: Bowel sounds are normal. She exhibits no distension.  Musculoskeletal: She exhibits no edema.  Neurological: She is alert.  Patient was able to provide place and age. She could not recall the year. Her speech was mildly slurred. Minimal facial asymmetry. She did follow basic commands. Reasonable insight and awareness. Mild left sided weakness (3+ left deltoid, tricep, bic, and 4- WE and HI,  and 3+ HF, KE to 4/5 distally.  Right upper and lower ext 4+. No sensory deficits.  Skin: Skin is warm and dry.     Results for orders  placed during the hospital encounter of 02/20/13 (from the past 48 hour(s))  CBC     Status: Abnormal   Collection Time    02/24/13  8:45 AM      Result Value Range   WBC 9.2  4.0 - 10.5 K/uL   RBC 3.57 (*) 3.87 - 5.11 MIL/uL   Hemoglobin 9.9 (*) 12.0 - 15.0 g/dL   HCT 56.2 (*) 13.0 - 86.5 %   MCV 88.2  78.0 - 100.0 fL   MCH 27.7  26.0 - 34.0 pg   MCHC 31.4  30.0 - 36.0 g/dL   RDW 78.4 (*) 69.6 - 29.5 %   Platelets 236  150 - 400 K/uL  BASIC METABOLIC PANEL     Status: Abnormal   Collection Time    02/24/13  8:45 AM      Result Value Range   Sodium 140  135 - 145 mEq/L   Potassium 3.6  3.5 - 5.1 mEq/L   Chloride 103  96 - 112 mEq/L   CO2 26  19 - 32 mEq/L   Glucose, Bld 133 (*)  70 - 99 mg/dL   BUN 8  6 - 23 mg/dL   Creatinine, Ser 4.09  0.50 - 1.10 mg/dL   Calcium 9.8  8.4 - 81.1 mg/dL   GFR calc non Af Amer 63 (*) >90 mL/min   GFR calc Af Amer 74 (*) >90 mL/min   Comment:            The eGFR has been calculated     using the CKD EPI equation.     This calculation has not been     validated in all clinical     situations.     eGFR's persistently     <90 mL/min signify     possible Chronic Kidney Disease.   No results found.  Post Admission Physician Evaluation: 1. Functional deficits secondary  to left brain CVA. 2. Patient is admitted to receive collaborative, interdisciplinary care between the physiatrist, rehab nursing staff, and therapy team. 3. Patient's level of medical complexity and substantial therapy needs in context of that medical necessity cannot be provided at a lesser intensity of care such as a SNF. 4. Patient has experienced substantial functional loss from his/her baseline which was documented above under the "Functional History" and "Functional Status" headings.  Judging by the patient's diagnosis, physical exam, and functional history, the patient has potential for functional progress which will result in measurable gains while on inpatient rehab.   These gains will be of substantial and practical use upon discharge  in facilitating mobility and self-care at the household level. 5. Physiatrist will provide 24 hour management of medical needs as well as oversight of the therapy plan/treatment and provide guidance as appropriate regarding the interaction of the two. 6. 24 hour rehab nursing will assist with bladder management, bowel management, safety, skin/wound care, disease management, medication administration, pain management and patient education  and help integrate therapy concepts, techniques,education, etc. 7. PT will assess and treat for/with: Lower extremity strength, range of motion, stamina, balance, functional mobility, safety, adaptive techniques and equipment, NMR, pain mgt, education.   Goals are: supervision to mod I. 8. OT will assess and treat for/with: ADL's, functional mobility, safety, upper extremity strength, adaptive techniques and equipment, NMR, safety.   Goals are: supervision. 9. SLP will assess and treat for/with: n/aa.  Goals are: n/a. 10. Case Management and Social Worker will assess and treat for psychological issues and discharge planning. 11. Team conference will be held weekly to assess progress toward goals and to determine barriers to discharge. 12. Patient will receive at least 3 hours of therapy per day at least 5 days per week. 13. ELOS: 7-10 days      Prognosis:  excellent   Medical Problem List and Plan: 1. 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 MRI not completed secondary to pacemaker 2. DVT Prophylaxis/Anticoagulation: SCDs. Monitor for any signs of DVT 3. Neuropsych: This patient is capable of making decisions on his/her own behalf. 4. Hypertension/PAF. Betapace 120 mg twice a day, eliquis 5 mg twice a day 5. Hyperlipidemia. Zocor/lovaza 6. Hypothyroidism. Synthroid 7. Recurrent UTI. Latest urinalysis study shows greater 100,000 group B(S.agalactiae). Continue Bactrim for now. Patient denies dysuria 8.  History of gout. Allopurinol twice a day. Monitor for signs of flare up  Ranelle Oyster, MD, [facility]  02/25/2013

## 2013-02-25 NOTE — Progress Notes (Signed)
Physical Therapy Treatment Patient Details Name: [name] MRN: [id number] DOB: 26-Apr-1931 Today's Date: 02/25/2013 Time: 0865-7846 PT Time Calculation (min): 25 min  PT Assessment / Plan / Recommendation Comments on Treatment Session  Pt demo'd significant improvement in ambulation tolerance this date. Pt con't to have sig increased trunk flexion and remains deconditioned. Pt to con't to benefit from CIR to achieve mod I function for safe transition home.    Follow Up Recommendations  CIR     Does the patient have the potential to tolerate intense rehabilitation     Barriers to Discharge        Equipment Recommendations  None recommended by PT    Recommendations for Other Services    Frequency Min 4X/week   Plan Discharge plan remains appropriate;Frequency remains appropriate    Precautions / Restrictions Precautions Precautions: Fall Restrictions Weight Bearing Restrictions: No   Pertinent Vitals/Pain 0/10    Mobility  Bed Mobility Bed Mobility: Supine to Sit;Sit to Supine Supine to Sit: 3: Mod assist;HOB flat;With rails Sitting - Scoot to Edge of Bed: 4: Min assist Sit to Supine: 4: Min assist;HOB flat Details for Bed Mobility Assistance: assist for trunk elevation, labored effort to get to EOB. Assist for LE elevation to return to supine Transfers Transfers: Sit to Stand;Stand to Sit Sit to Stand: 4: Min assist;With upper extremity assist;From bed Stand to Sit: 4: Min assist;With upper extremity assist;With armrests;To chair/3-in-1 Details for Transfer Assistance: v/c's for safe hand placement Ambulation/Gait Ambulation/Gait Assistance: 4: Min assist Ambulation Distance (Feet): 50 Feet (x2) Assistive device: Rolling walker Ambulation/Gait Assistance Details: con't v/c's to achieve upright posture, pt with tendency to amb with increased trunk flex. pt vearing to Right Gait Pattern: Step-through pattern;Shuffle;Trunk flexed;Narrow base of support Gait  velocity: slow pace General Gait Details: seated rest break x 5 min in btw Stairs: No Modified Rankin (Stroke Patients Only) Pre-Morbid Rankin Score: Moderately severe disability Modified Rankin: Moderately severe disability    Exercises     PT Diagnosis:    PT Problem List:   PT Treatment Interventions:     PT Goals Acute Rehab PT Goals PT Goal: Supine/Side to Sit - Progress: Progressing toward goal PT Goal: Sit to Supine/Side - Progress: Progressing toward goal PT Goal: Sit to Stand - Progress: Progressing toward goal PT Goal: Stand to Sit - Progress: Progressing toward goal PT Goal: Ambulate - Progress: Progressing toward goal  Visit Information  Last PT Received On: 02/25/13 Assistance Needed: +1    Subjective Data  Subjective: Pt received supine in bed with report "I'm sleepy"   Cognition  Cognition Overall Cognitive Status: Appears within functional limits for tasks assessed/performed Arousal/Alertness: Awake/alert Orientation Level: Oriented X4 / Intact Current Attention Level: Selective Attention - Other Comments: pt easily distracted    Balance  Static Sitting Balance Static Sitting - Balance Support: Bilateral upper extremity supported;Feet supported Static Sitting - Level of Assistance: 5: Stand by assistance Static Sitting - Comment/# of Minutes: 5 min Static Standing Balance Static Standing - Balance Support: Bilateral upper extremity supported Static Standing - Level of Assistance: 4: Min assist  End of Session PT - End of Session Equipment Utilized During Treatment: Gait belt Activity Tolerance: Patient tolerated treatment well Patient left: in bed;with call bell/phone within reach;with bed alarm set Nurse Communication: Mobility status   GP     [name] 02/25/2013, 4:53 PM  Lewis Shock, PT, DPT Pager #: [phone] Office #: [phone]

## 2013-02-25 NOTE — Progress Notes (Signed)
SUBJECTIVE:  No palpitations.  OBJECTIVE:   Vitals:   Filed Vitals:   02/24/13 2226 02/25/13 0200 02/25/13 0600 02/25/13 1000  BP: 143/54 119/45 149/55 136/76  Pulse: 68 68 66 73  Temp: 98.5 F (36.9 C) 98.6 F (37 C) 98 F (36.7 C) 97.7 F (36.5 C)  TempSrc: Oral Oral Oral Oral  Resp: 18 18 18 18   Height:      Weight:      SpO2: 97% 96% 100% 98%   I&O's:  No intake or output data in the 24 hours ending 02/25/13 1340 TELEMETRY: Reviewed telemetry pt in NSR, PACs:     PHYSICAL EXAM General: Well developed, well nourished, in no acute distress Head:    Normal cephalic and atramatic  Lungs: No wheezing Heart:   HRRR S1 S2  Abdomen:  abdomen soft and non-tender  Extremities:   No  edema.  DP +1 Neuro: Alert and oriented. Psych:  Normal affect, responds appropriately   LABS: Basic Metabolic Panel:  Recent Labs  40/98/11 0845  NA 140  K 3.6  CL 103  CO2 26  GLUCOSE 133*  BUN 8  CREATININE 0.85  CALCIUM 9.8   Liver Function Tests: No results found for this basename: AST, ALT, ALKPHOS, BILITOT, PROT, ALBUMIN,  in the last 72 hours No results found for this basename: LIPASE, AMYLASE,  in the last 72 hours CBC:  Recent Labs  02/24/13 0845  WBC 9.2  HGB 9.9*  HCT 31.5*  MCV 88.2  PLT 236   Cardiac Enzymes: No results found for this basename: CKTOTAL, CKMB, CKMBINDEX, TROPONINI,  in the last 72 hours BNP: No components found with this basename: POCBNP,  D-Dimer: No results found for this basename: DDIMER,  in the last 72 hours Hemoglobin A1C: No results found for this basename: HGBA1C,  in the last 72 hours Fasting Lipid Panel: No results found for this basename: CHOL, HDL, LDLCALC, TRIG, CHOLHDL, LDLDIRECT,  in the last 72 hours Thyroid Function Tests: No results found for this basename: TSH, T4TOTAL, FREET3, T3FREE, THYROIDAB,  in the last 72 hours Anemia Panel: No results found for this basename: VITAMINB12, FOLATE, FERRITIN, TIBC, IRON,  RETICCTPCT,  in the last 72 hours Coag Panel:   Lab Results  Component Value Date   INR 0.91 02/20/2013   INR 1.11 11/02/2012   INR 0.98 06/11/2012    RADIOLOGY: Dg Chest 2 View  02/20/2013  *RADIOLOGY REPORT*  Clinical Data: Stroke.  Cough.  CHEST - 2 VIEW  Comparison: Two-view chest x-ray 12/31/2012 [facility], and 09/04/2012, 06/12/2012 [place].  Findings: Suboptimal inspiration.  Cardiac silhouette enlarged but stable.  Right subclavian dual lead transvenous pacemaker unchanged and appears intact.  Stable elevation of the right hemidiaphragm with chronic scar/atelectasis in the right lower lobe.  Lungs remain clear otherwise.  Pulmonary vascularity normal.  No pleural effusions.  Mild degenerative changes involving the thoracic spine. Prior left shoulder arthroplasty with anatomic alignment.  IMPRESSION: Stable cardiomegaly. Stable chronic elevation of the right hemidiaphragm with chronic scar/atelectasis in the right lower lobe.  No acute cardiopulmonary disease.   Original Report Authenticated By: Hulan Saas, M.D.    Ct Head Wo Contrast  02/21/2013  *RADIOLOGY REPORT*  Clinical Data: 24 hours post t-PA  CT HEAD WITHOUT CONTRAST  Technique:  Contiguous axial images were obtained from the base of the skull through the vertex without contrast.  Comparison: CT 02/20/2013  Findings: Moderate atrophy.  Mild chronic microvascular ischemia in the white matter, unchanged.  No acute infarct.  Negative for intracranial hemorrhage or mass.  Chronic sinusitis  IMPRESSION: Atrophy and chronic microvascular ischemia.  No acute abnormality. Negative for hemorrhage.   Original Report Authenticated By: Janeece Riggers, M.D.    Ct Head Wo Contrast  02/20/2013  *RADIOLOGY REPORT*  Clinical Data: Code stroke.  Slurred speech and difficult to communicating.  CT HEAD WITHOUT CONTRAST  Technique:  Contiguous axial images were obtained from the base of the skull through the vertex without contrast.  Comparison: Head CT  01/24/2013 and multiple priors dating back to the 06/11/2005  Findings: Stable marked cerebral atrophy with associated ventriculomegaly and moderate periventricular chronic white matter ischemic changes.  Negative for hemorrhage, mass effect, midline shift, mass lesion, or evidence of acute cortically based infarction.  There is chronic mucosal thickening and along the visualized aspect of the right maxillary sinus, with suspected postsurgical changes of the right maxillary sinus, partially visualized.  There is fluid within some of the right ethmoid air cells.  No chronic right mastoid effusion and postsurgical changes of the right mastoids are noted.  The skull is intact.  IMPRESSION: 1.  No acute intracranial abnormality.  2.  Stable cerebral atrophy, ventriculomegaly and periventricular white matter disease. 3.  Chronic right maxillary sinus disease, right ethmoid disease, and chronic right mastoid effusion.   Original Report Authenticated By: Britta Mccreedy, M.D.       ASSESSMENT:AFib, CVA  PLAN:  Eliquis for stroke prevention.  Maintaining NSR.  Spoke about the importance of avoiding falls.  Corky Crafts., MD  02/25/2013  1:40 PM

## 2013-02-25 NOTE — Progress Notes (Signed)
Admitting patient to CIR today. Dr Pearlean Brownie reports pt is ready for d/c to CIR. Patient is in agreement with plan.  For questions call [phone]

## 2013-02-25 NOTE — Discharge Summary (Signed)
Stroke Discharge Summary  Patient ID: [name]   MRN: [id number]      DOB: 10-Nov-1931  Date of Admission: 02/20/2013 Date of Discharge: 02/25/2013  Attending Physician:  Darcella Cheshire, MD, Stroke MD  Consulting Physician(s):  Treatment Team:  Md Stroke, MD Quintella Reichert, MD, Faith Rogue, MD (Physical Medicine & Rehabtilitation)  Patient's PCP:  Johny Blamer, MD  Discharge Diagnoses:    Left brain stroke status post IV t-PA, embolic secondary to known atrial fibrillation.   Atrial fibrillation   Hypertension    Hyperlipidemia   Dysphagia PTA    Hx TIA    UTI    Permanent pacemaker   Anemia    Mild renal insufficiency   BMI  Body mass index is 24 kg/(m^2).   Past Medical History  Diagnosis Date  . Hypertension   . Hyperlipidemia   . GERD (gastroesophageal reflux disease)   . Dysphagia     appt with Molly Maduro Buccini upcoming  . Chronic recurrent sinusitis   . Recurrent UTI   . Atrial fibrillation     ???  . Arthritis   . TIA (transient ischemic attack)    Past Surgical History  Procedure Laterality Date  . Pacemaker insertion    . Back surgery  1985  . Shoulder surgery  2008    Left  (Dr. Ranell Patrick)  . Total knee arthroplasty  2000, 2001    bilateral (Dr. Despina Hick), left 2000, right 2001  . Cataract extraction  1986, 2003    left 1986, right 2003  . Vaginal hysterectomy  1969  . Bilateral salpingoophorectomy  1971  . Tonsillectomy  1951  . Appendectomy  1952  . Mastoidectomy  1970  . Sinus exploration  1972  . Knee arthroscopy  1988, 1995, 2000  . Hemorrhoid surgery  2009  . Repair / reconstruction interphalangeal joint  2004    R thumb  . Cystoscopy  1992  . Insert / replace / remove pacemaker      Medications to be continued on Rehab . allopurinol  100 mg Oral BID  . apixaban  5 mg Oral BID  . calcitRIOL  0.25 mcg Oral q morning - 10a  . DULoxetine  60 mg Oral q morning - 10a  . levothyroxine  25 mcg Oral q morning - 10a  .  multivitamin with minerals  1 tablet Oral q morning - 10a  . omega-3 acid ethyl esters  1 g Oral q morning - 10a  . pantoprazole  40 mg Oral BID  . simvastatin  40 mg Oral QPM  . sotalol  120 mg Oral BID  . sulfamethoxazole-trimethoprim  1 tablet Oral Q12H    LABORATORY STUDIES CBC    Component Value Date/Time   WBC 9.2 02/24/2013 0845   RBC 3.57* 02/24/2013 0845   HGB 9.9* 02/24/2013 0845   HCT 31.5* 02/24/2013 0845   PLT 236 02/24/2013 0845   MCV 88.2 02/24/2013 0845   MCH 27.7 02/24/2013 0845   MCHC 31.4 02/24/2013 0845   RDW 18.8* 02/24/2013 0845   LYMPHSABS 2.1 02/20/2013 1613   MONOABS 0.7 02/20/2013 1613   EOSABS 0.5 02/20/2013 1613   BASOSABS 0.1 02/20/2013 1613   CMP    Component Value Date/Time   NA 140 02/24/2013 0845   K 3.6 02/24/2013 0845   CL 103 02/24/2013 0845   CO2 26 02/24/2013 0845   GLUCOSE 133* 02/24/2013 0845   BUN 8 02/24/2013 0845   CREATININE  0.85 02/24/2013 0845   CALCIUM 9.8 02/24/2013 0845   PROT 6.4 02/20/2013 1613   ALBUMIN 3.2* 02/20/2013 1613   AST 18 02/20/2013 1613   ALT 8 02/20/2013 1613   ALKPHOS 76 02/20/2013 1613   BILITOT 0.2* 02/20/2013 1613   GFRNONAA 63* 02/24/2013 0845   GFRAA 74* 02/24/2013 0845   COAGS Lab Results  Component Value Date   INR 0.91 02/20/2013   INR 1.11 11/02/2012   INR 0.98 06/11/2012   Lipid Panel    Component Value Date/Time   CHOL 166 02/21/2013 0305   TRIG 232* 02/21/2013 0305   HDL 35* 02/21/2013 0305   CHOLHDL 4.7 02/21/2013 0305   VLDL 46* 02/21/2013 0305   LDLCALC 85 02/21/2013 0305   HgbA1C  Lab Results  Component Value Date   HGBA1C 6.1* 02/21/2013   Cardiac Panel (last 3 results) No results found for this basename: CKTOTAL, CKMB, TROPONINI, RELINDX,  in the last 72 hours Urinalysis    Component Value Date/Time   COLORURINE YELLOW 02/21/2013 0038   APPEARANCEUR CLOUDY* 02/21/2013 0038   LABSPEC 1.019 02/21/2013 0038   PHURINE 5.5 02/21/2013 0038   GLUCOSEU NEGATIVE 02/21/2013 0038   HGBUR SMALL* 02/21/2013 0038    BILIRUBINUR NEGATIVE 02/21/2013 0038   KETONESUR NEGATIVE 02/21/2013 0038   PROTEINUR 100* 02/21/2013 0038   UROBILINOGEN 0.2 02/21/2013 0038   NITRITE NEGATIVE 02/21/2013 0038   LEUKOCYTESUR MODERATE* 02/21/2013 0038   Urine Drug Screen     Component Value Date/Time   LABOPIA POSITIVE* 02/21/2013 0038   COCAINSCRNUR NONE DETECTED 02/21/2013 0038   LABBENZ POSITIVE* 02/21/2013 0038   AMPHETMU NONE DETECTED 02/21/2013 0038   THCU NONE DETECTED 02/21/2013 0038   LABBARB POSITIVE* 02/21/2013 0038    Alcohol Level    Component Value Date/Time   ETH <11 02/20/2013 1613    SIGNIFICANT DIAGNOSTIC STUDIES CT of the brain  02/21/2013 Stable. Moderate difuse atrophy and chronic small vessel ischemic disease  02/20/2013 1. No acute intracranial abnormality. 2. MODERATE stable cerebral atrophy, ventriculomegaly and periventricular white matter disease. 3. Chronic right maxillary sinus disease, right ethmoid disease, and chronic right mastoid effusion.  MRI/A of the brain Cannot due to pacemaker  2D Echocardiogram ejection fraction 65-70% - no cardiac source of emboli was identified.  Carotid Doppler Prelim - No evidence of hemodynamically significant internal carotid artery stenosis. Vertebral artery flow is antegrade.  CXR 02/20/2013 Stable cardiomegaly. Stable chronic elevation of the right hemidiaphragm with chronic scar/atelectasis in the right lower lobe. No acute cardiopulmonary disease.  EKG atrial fibrillation, rate 63  History of Present Illness  [name] is a 79 y.o. female who was in her normal state at 2:45 pm 02/20/2013, then was seen by her son to not be able to speak around 3:15 pm. Patient asked her son to make her toast. After making, son noticed her speaking jibberish and didn't know his name - called him her brother Robert's name. Started improving on 02/20/13 evening. This has happened two times before, but both were transient. She received TPA for this event on 02/20/13. She was  admitted to the neuro ICU for further evaluation and treatment.  Hospital Course  Left brain stroke suspected; unable to confirm with MRI secondary to pacemaker. Infarct felt to be embolic secondary to known atrial fibrillation. On clopidogrel 75 mg orally every day prior to admission. Changed to Apixaban for secondary stroke prevention. Had been on warfarin in the past, but stopped due to fall risk.   Vascular  Risk Factors:   Hypertension - stable  Hyperlipidemia, LDL 85, on statin PTA, on statin now, at goal LDL < 100   Hx TIA  Patient also with:  Positive UTI on seven-day course treated with Septra started 02/21/2013  And Mild renal insufficiency which is improving  Patient has resultant expressive aphasia, unsteady gair. Physical therapy, occupational therapy and speech therapy evaluated patient. Son plans to provide care, but wants rehab prior to return home with him. All agreed inpatient rehab is needed. Patient's son is/are supportive and can provide care at discharge. CIR bed is available today and patient will be transferred there.  Discharge Exam  Blood pressure 136/76, pulse 73, temperature 97.7 F (36.5 C), temperature source Oral, resp. rate 18, height 5\' 5"  (1.651 m), weight 65.409 kg (144 lb 3.2 oz), SpO2 98.00%.  General - pleasantly confused 79 year old female in no acute distress.  Heart - irregularly irregular rhythm - no murmer  Lungs - Clear to auscultation  Abdomen - Soft - non tender  Extremities - Distal pulses intact - no edema  Skin - Warm and dry   Discharge Diet  Cardiac thin liquids  Discharge Plan  Disposition:  Transfer to [facility] for ongoing PT, OT and ST  apixaban for secondary stroke prevention.  Ongoing risk factor control by Primary Care Physician. Risk factor recommendations:  Hypertension target range 130-140/70-80 Lipid range - LDL < 100 and checked every 6 months, fasting atrial fibrillation rate control     Follow-up  Johny Blamer, MD in 1 month.  Follow-up with Dr. Delia Heady in 2 months.  30 minutes were spent preparing discharge.  Signed Annie Main, AVNP, ANP-BC, [facility] Nurse Practitioner 02/25/2013, 12:08 PM  I have personally examined this patient, reviewed pertinent data and developed the plan of care. I agree with above.  Delia Heady, MD

## 2013-02-26 ENCOUNTER — Inpatient Hospital Stay (HOSPITAL_COMMUNITY): Payer: Medicare Other | Admitting: Occupational Therapy

## 2013-02-26 ENCOUNTER — Encounter (HOSPITAL_COMMUNITY): Payer: Self-pay

## 2013-02-26 ENCOUNTER — Inpatient Hospital Stay (HOSPITAL_COMMUNITY): Payer: Medicare Other | Admitting: Speech Pathology

## 2013-02-26 ENCOUNTER — Inpatient Hospital Stay (HOSPITAL_COMMUNITY): Payer: Medicare Other | Admitting: *Deleted

## 2013-02-26 DIAGNOSIS — I6330 Cerebral infarction due to thrombosis of unspecified cerebral artery: Secondary | ICD-10-CM

## 2013-02-26 DIAGNOSIS — Z8673 Personal history of transient ischemic attack (TIA), and cerebral infarction without residual deficits: Secondary | ICD-10-CM

## 2013-02-26 LAB — COMPREHENSIVE METABOLIC PANEL
ALT: 6 U/L (ref 0–35)
Albumin: 2.7 g/dL — ABNORMAL LOW (ref 3.5–5.2)
Alkaline Phosphatase: 61 U/L (ref 39–117)
Potassium: 3.4 mEq/L — ABNORMAL LOW (ref 3.5–5.1)
Sodium: 139 mEq/L (ref 135–145)
Total Protein: 5.7 g/dL — ABNORMAL LOW (ref 6.0–8.3)

## 2013-02-26 LAB — CBC WITH DIFFERENTIAL/PLATELET
Basophils Relative: 0 % (ref 0–1)
Eosinophils Absolute: 0.3 10*3/uL (ref 0.0–0.7)
MCH: 29.4 pg (ref 26.0–34.0)
MCHC: 33.6 g/dL (ref 30.0–36.0)
Neutrophils Relative %: 62 % (ref 43–77)
Platelets: 201 10*3/uL (ref 150–400)

## 2013-02-26 MED ORDER — POTASSIUM CHLORIDE CRYS ER 10 MEQ PO TBCR
10.0000 meq | EXTENDED_RELEASE_TABLET | Freq: Two times a day (BID) | ORAL | Status: AC
  Administered 2013-02-26 – 2013-02-27 (×3): 10 meq via ORAL
  Filled 2013-02-26 (×4): qty 1

## 2013-02-26 NOTE — Interval H&P Note (Signed)
Tressy R Riedl was admitted today to Inpatient Rehabilitation with the diagnosis of embolic left brain infarct.  The patient's history has been reviewed, patient examined, and there is no change in status.  Patient continues to be appropriate for intensive inpatient rehabilitation.  I have reviewed the patient's chart and labs.  Questions were answered to the patient's satisfaction.  SWARTZ,ZACHARY T 02/26/2013, 7:09 AM

## 2013-02-26 NOTE — Progress Notes (Signed)
Patient ID: [name], female   DOB: [birth year], 79 y.o.   MRN: [id number]  Subjective/Complaints: 79 y.o. right-handed female with history of PAF/tachybradycardia syndrome who had initially been on anti-coagulation with Coumadin years ago but due to unsteady gait and risk of falling Coumadin was discontinued. Admitted 02/20/2013 with slurred speech. Cranial CT scan was negative for acute abnormalities. Patient did receive TPA. MRI of the brain not done secondary to pacemaker. Echocardiogram with ejection fraction of 70% without embolism. Carotid Dopplers with no ICA stenosis. EKG did show atrial fibrillation. Neurology followup question recurrent stroke versus TIA. Patient presently maintained on Eliquis for atrial fibrillation as well as stroke prophylaxis. Urine study did show greater than 100,000 group B(S.Agalactiae) and placed on Septra Vomited last noc, nauseated PMH:  Hx Left RCT, Post op infection Review of Systems  Gastrointestinal: Positive for nausea, vomiting and abdominal pain. Negative for diarrhea.  Musculoskeletal: Positive for joint pain.  All other systems reviewed and are negative.    Objective: Vital Signs: Blood pressure 149/65, pulse 61, temperature 98.5 F (36.9 C), temperature source Oral, resp. rate 20, weight 63.9 kg (140 lb 14 oz), SpO2 95.00%. No results found. Results for orders placed during the hospital encounter of 02/25/13 (from the past 72 hour(s))  CBC WITH DIFFERENTIAL     Status: Abnormal   Collection Time    02/26/13  5:55 AM      Result Value Range   WBC 7.5  4.0 - 10.5 K/uL   RBC 2.99 (*) 3.87 - 5.11 MIL/uL   Hemoglobin 8.8 (*) 12.0 - 15.0 g/dL   HCT 40.9 (*) 81.1 - 91.4 %   MCV 87.6  78.0 - 100.0 fL   MCH 29.4  26.0 - 34.0 pg   MCHC 33.6  30.0 - 36.0 g/dL   RDW 78.2 (*) 95.6 - 21.3 %   Platelets 201  150 - 400 K/uL   Neutrophils Relative 62  43 - 77 %   Neutro Abs 4.6  1.7 - 7.7 K/uL   Lymphocytes Relative 21  12 - 46 %   Lymphs Abs  1.6  0.7 - 4.0 K/uL   Monocytes Relative 12  3 - 12 %   Monocytes Absolute 0.9  0.1 - 1.0 K/uL   Eosinophils Relative 4  0 - 5 %   Eosinophils Absolute 0.3  0.0 - 0.7 K/uL   Basophils Relative 0  0 - 1 %   Basophils Absolute 0.0  0.0 - 0.1 K/uL  COMPREHENSIVE METABOLIC PANEL     Status: Abnormal   Collection Time    02/26/13  5:55 AM      Result Value Range   Sodium 139  135 - 145 mEq/L   Potassium 3.4 (*) 3.5 - 5.1 mEq/L   Chloride 103  96 - 112 mEq/L   CO2 28  19 - 32 mEq/L   Glucose, Bld 107 (*) 70 - 99 mg/dL   BUN 10  6 - 23 mg/dL   Creatinine, Ser 0.86  0.50 - 1.10 mg/dL   Calcium 9.0  8.4 - 57.8 mg/dL   Total Protein 5.7 (*) 6.0 - 8.3 g/dL   Albumin 2.7 (*) 3.5 - 5.2 g/dL   AST 12  0 - 37 U/L   ALT 6  0 - 35 U/L   Alkaline Phosphatase 61  39 - 117 U/L   Total Bilirubin 0.2 (*) 0.3 - 1.2 mg/dL   GFR calc non Af Amer 50 (*) >90 mL/min  GFR calc Af Amer 58 (*) >90 mL/min   Comment:            The eGFR has been calculated     using the CKD EPI equation.     This calculation has not been     validated in all clinical     situations.     eGFR's persistently     <90 mL/min signify     possible Chronic Kidney Disease.     HEENT: normal and no facial droop or weakness Cardio: irregular and no murmurs Resp: CTA B/L and unlabored GI: BS positive and mild diffuse tenderness Extremity:  No Edema Skin:   Intact Neuro: Alert/Oriented, Cranial Nerve II-XII normal, Normal Sensory, Abnormal Motor 3-/5 Left deltoid,4/5 Left bi, tri, grip, 4/5 in RUE and BLE, Abnormal FMC Ataxic/ dec FMC and Tone  Within Normal Limits and Tone:  Within Normal Limits Musc/Skel:  Extremity tender Left shoulder pain with active and passive ROM Gen NAD   Assessment/Plan: 1. 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 Physiatrist is providing close team supervision and 24  hour management of active medical problems listed below. Physiatrist and rehab team continue to assess barriers to discharge/monitor patient progress toward functional and medical goals. FIM:                   Comprehension Comprehension Mode: Auditory Comprehension: 3-Understands basic 50 - 74% of the time/requires cueing 25 - 50%  of the time  Expression Expression Mode: Verbal Expression: 3-Expresses basic 50 - 74% of the time/requires cueing 25 - 50% of the time. Needs to repeat parts of sentences.  Social Interaction Social Interaction: 4-Interacts appropriately 75 - 89% of the time - Needs redirection for appropriate language or to initiate interaction.  Problem Solving Problem Solving: 3-Solves basic 50 - 74% of the time/requires cueing 25 - 49% of the time  Memory Memory: 4-Recognizes or recalls 75 - 89% of the time/requires cueing 10 - 24% of the time   Medical Problem List and Plan:  1. 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 MRI not completed secondary to pacemaker  2. DVT Prophylaxis/Anticoagulation: SCDs. Monitor for any signs of DVT  3. Neuropsych: This patient is capable of making decisions on his/her own behalf.  4. Hypertension/PAF. Betapace 120 mg twice a day, eliquis 5 mg twice a day  5. Hyperlipidemia. Zocor/lovaza  6. Hypothyroidism. Synthroid  7. Recurrent UTI. Latest urinalysis study shows greater 100,000 group B(S.agalactiae). Continue Bactrim for now. Patient denies dysuria  8. History of gout. Allopurinol twice a day. Monitor for signs of flare up 9.  Anemia-check stool OB  LOS (Days) 1 A FACE TO FACE EVALUATION WAS PERFORMED  Holliday Sheaffer E 02/26/2013, 7:37 AM

## 2013-02-26 NOTE — Progress Notes (Signed)
Occupational Therapy Session Note  Patient Details  Name: [name] MRN: [id number] Date of Birth: [birth year]  Today's Date: 02/26/2013 Time: 1300-1330 Time Calculation (min): 30 min  Short Term Goals: Week 1:  OT Short Term Goal 1 (Week 1): Pt will peform toilet transfers with close supervision using the RW and 3:1. OT Short Term Goal 2 (Week 1): Pt will peform LB bathing and dressing with supervision sit to stand for 2 consecutive sessions. OT Short Term Goal 3 (Week 1): Pt will perform walk-in shower transfer with supervision using RW to shower seat. OT Short Term Goal 4 (Week 1): Pt will gather all clothes/towels/washcloths prior to shower with no more than min questioning cues.  Skilled Therapeutic Interventions/Progress Updates:  Ambulate to/from bathroom, toilet transfers to elevated toilet and to adjusted BSC, stand at sink for wash hands and brush teeth.  Focused session on sit><stands, dynamic balance, standing tolerance, postural control, functional use of LUE with bilateral tasks.  Therapy Documentation Precautions:  Precautions Precautions: Fall;ICD/Pacemaker Precaution Comments: h/o recurrent falls at home Restrictions Weight Bearing Restrictions: No Pain: Pain Assessment Pain Assessment: No/denies pain  Therapy/Group: Individual Therapy  Nimisha Rathel 02/26/2013, 4:49 PM

## 2013-02-26 NOTE — Care Management Note (Signed)
Inpatient Rehabilitation Center Individual Statement of Services  Patient Name:  [name]  Date:  02/26/2013  Welcome to the Inpatient Rehabilitation Center.  Our goal is to provide you with an individualized program based on your diagnosis and situation, designed to meet your specific needs.  With this comprehensive rehabilitation program, you will be expected to participate in at least 3 hours of rehabilitation therapies Monday-Friday, with modified therapy programming on the weekends.  Your rehabilitation program will include the following services:  Physical Therapy (PT), Occupational Therapy (OT), Speech Therapy (ST), 24 hour per day rehabilitation nursing, Case Management ( Social Worker), Rehabilitation Medicine, Nutrition Services and Pharmacy Services  Weekly team conferences will be held on Wednesday to discuss your progress.  Your  Social Worker will talk with you frequently to get your input and to update you on team discussions.  Team conferences with you and your family in attendance may also be held.  Expected length of stay: 7 days  Overall anticipated outcome: supervision/min level  Depending on your progress and recovery, your program may change.  Your Social Worker will coordinate services and will keep you informed of any changes.  Your  SW name and contact number are listed  below.  The following services may also be recommended but are not provided by the Inpatient Rehabilitation Center:    Home Health Rehabiltiation Services  Outpatient Rehabilitatation Servives    Arrangements will be made to provide these services after discharge if needed.  Arrangements include referral to agencies that provide these services.  Your insurance has been verified to be:  Medicare & BCBS Your primary doctor is:  Dr. Johny Blamer  Pertinent information will be shared with your doctor and your insurance company.   Social Worker:  Dossie Der, [place]  [phone]  Information discussed with and copy given to patient by: Lucy Chris, 02/26/2013, 3:19 PM

## 2013-02-26 NOTE — Evaluation (Signed)
Physical Therapy Assessment and Plan  Patient Details  Name: AYUMI WANGERIN MRN: [id number] Date of Birth: [birth year]  PT Diagnosis: Abnormal posture, Abnormality of gait, Cognitive deficits, Coordination disorder, Hemiparesis dominant, Impaired cognition and Muscle weakness Rehab Potential: Good ELOS: 7-10 days   Today's Date: 02/26/2013 Time: 9562-1308 Time Calculation (min): 63 min  Problem List:  Patient Active Problem List  Diagnosis  . TIA (transient ischemic attack)  . Hypertension  . Hyperlipidemia  . Microscopic hematuria  . Anemia  . UTI (lower urinary tract infection)  . Altered mental state  . PNA (pneumonia)  . Atrial fibrillation  . Hypokalemia  . Physical deconditioning  . CVA (cerebral infarction)    Past Medical History:  Past Medical History  Diagnosis Date  . Hypertension   . Hyperlipidemia   . GERD (gastroesophageal reflux disease)   . Dysphagia     appt with Molly Maduro Buccini upcoming  . Chronic recurrent sinusitis   . Recurrent UTI   . Atrial fibrillation     ???  . Arthritis   . TIA (transient ischemic attack)   . Anemia   . Hypothyroidism    Past Surgical History:  Past Surgical History  Procedure Laterality Date  . Pacemaker insertion    . Back surgery  1985  . Shoulder surgery  2008    Left  (Dr. Ranell Patrick)  . Total knee arthroplasty  2000, 2001    bilateral (Dr. Despina Hick), left 2000, right 2001  . Cataract extraction  1986, 2003    left 1986, right 2003  . Vaginal hysterectomy  1969  . Bilateral salpingoophorectomy  1971  . Tonsillectomy  1951  . Appendectomy  1952  . Mastoidectomy  1970  . Sinus exploration  1972  . Knee arthroscopy  1988, 1995, 2000  . Hemorrhoid surgery  2009  . Repair / reconstruction interphalangeal joint  2004    R thumb  . Cystoscopy  1992  . Insert / replace / remove pacemaker      Assessment & Plan Clinical Impression: Mariel R Levenhagen is a 79 y.o. right-handed female with history of  PAF/tachybradycardia syndrome who had initially been on anti-coagulation with Coumadin years ago but due to unsteady gait and risk of falling Coumadin was discontinued. Admitted 02/20/2013 with slurred speech. Cranial CT scan was negative for acute abnormalities. Patient did receive TPA. MRI of the brain not done secondary to pacemaker. Echocardiogram with ejection fraction of 70% without embolism. Carotid Dopplers with no ICA stenosis. EKG did show atrial fibrillation. Neurology followup question recurrent stroke versus TIA. Patient presently maintained on Eliquis for atrial fibrillation as well as stroke prophylaxis. Urine study did show greater than 100,000 group B(S.Agalactiae) and placed on Septra. Physical and occupational therapy evaluations completed. M.D. is requested physical medicine rehabilitation consult to consider inpatient rehabilitation services. Patient transferred to CIR on 02/25/2013 .   Patient currently requires min with mobility secondary to muscle weakness, decreased cardiorespiratoy endurance, decreased coordination and decreased memory.  Prior to hospitalization, patient was supervision -min assist with mobility and lived with Son in a House home.  Home access is 4Stairs to enter.  Patient will benefit from skilled PT intervention to maximize safe functional mobility, minimize fall risk and decrease caregiver burden for planned discharge home with intermittent assist.  Anticipate patient will benefit from follow up [facility] at discharge.  PT - End of Session Activity Tolerance: Tolerates 30+ min activity with multiple rests Endurance Deficit: No PT Assessment Rehab Potential: Good Barriers to Discharge:  Decreased caregiver support Barriers to Discharge Comments: Patient's son works during the day and patient reports no family or friends locallyl to assist when he is at work. PT Plan PT Intensity: Minimum of 1-2 x/day ,45 to 90 minutes PT Frequency: 5 out of 7 days PT Duration  Estimated Length of Stay: 10-14 days PT Treatment/Interventions: Ambulation/gait training;Balance/vestibular training;Cognitive remediation/compensation;Community reintegration;Discharge planning;Neuromuscular re-education;Functional mobility training;DME/adaptive equipment instruction;Pain management;Patient/family education;Psychosocial support;UE/LE Coordination activities;UE/LE Strength taining/ROM;Therapeutic Exercise;Therapeutic Activities;Wheelchair propulsion/positioning;Stair training PT Recommendation Recommendations for Other Services: Speech consult Follow Up Recommendations: Home health PT Patient destination: Home Equipment Recommended: None recommended by PT Equipment Details: DME assessment ongoing, recommendations TBD upon discharge  Skilled Therapeutic Intervention Therapeutic intervention initiated after completion of evaluation.  PT Evaluation Precautions/Restrictions Precautions Precautions: Fall;ICD/Pacemaker Precaution Comments: h/o recurrent falls at home Restrictions Weight Bearing Restrictions: No General Chart Reviewed: Yes  Pain Pain Assessment Pain Assessment: No/denies pain Pain Score: 0-No pain Home Living/Prior Functioning Home Living Lives With: Son Available Help at Discharge: Family;Available PRN/intermittently (son works during the day; other family live far) Type of Home: House Home Access: Stairs to enter [employer] of Steps: 4 Entrance Stairs-Rails: Right;Left Home Layout: Two level;Able to live on main level with bedroom/bathroom Alternate Level Stairs-Number of Steps: 12-14 Alternate Level Stairs-Rails: Can reach both;Right;Left Bathroom Shower/Tub: Walk-in shower;Door [employer] Toilet: Standard Bathroom Accessibility: Yes How Accessible: Accessible via walker Home Adaptive Equipment: Walker - rolling;Bedside commode/3-in-1;Shower chair with back;Wheelchair - manual Additional Comments: pt reports she has several RWs at  home Prior Function Level of Independence: Requires assistive device for independence;Other (comment) (Pt's son with her for gait and transfers, provided steadying assist; Patient reported she was "extra careful" when ambulating while son was at work and unavailable to assist) Able to Take Stairs?: Yes Driving: No Vocation: Retired Comments: Patient was receiving HHPT PTA (secondary to previous stay in hosptail due to PNA) Vision/Perception  Vision - History Baseline Vision: Wears glasses for distance only Visual History: Cataracts Patient Visual Report: No change from baseline  Cognition Arousal/Alertness: Awake/alert Orientation Level: Oriented to person;Oriented to place;Oriented to situation;Disoriented to time (did not know year) Sensation Sensation Light Touch: Appears Intact Proprioception: Appears Intact Additional Comments: Proprioception intact at B ankle and great toes Coordination Gross Motor Movements are Fluid and Coordinated: Yes Fine Motor Movements are Fluid and Coordinated: No (decreased speed/accuracy with rapid alternating movements) Heel Shin Test: Slight dysmetria/overshooting with L LE Motor  Motor Motor: Abnormal postural alignment and control Motor - Skilled Clinical Observations: Mild hemiparesis on L LE, no clonus noted B LE  Mobility Transfers Sit to Stand: 4: Min assist;From chair/3-in-1;With armrests;With upper extremity assist Sit to Stand Details: Manual facilitation for weight shifting;Verbal cues for sequencing;Verbal cues for technique;Verbal cues for precautions/safety Sit to Stand Details (indicate cue type and reason): Patient requires verbal cues for proper foot placement prior to sit>stand. Stand to Sit: 4: Min assist;With upper extremity assist;With armrests;To chair/3-in-1 Stand to Sit Details (indicate cue type and reason): Manual facilitation for weight shifting;Verbal cues for precautions/safety;Verbal cues for technique;Verbal cues for  sequencing Locomotion  Ambulation Ambulation: Yes Ambulation/Gait Assistance: 4: Min assist Ambulation Distance (Feet): 55 Feet Assistive device: Rolling walker Ambulation/Gait Assistance Details: Tactile cues for posture;Manual facilitation for weight shifting;Verbal cues for precautions/safety;Verbal cues for gait pattern Ambulation/Gait Assistance Details: Patient requires verbal and tactile cues for upright posture. Patient also requires verbal cues to maintain BOS within RW and maintain straight path as she tends to drift R during gait training. Patient requires verbal cues  to look ahead, as she appears distracted by environment and looks all around during gait training, causing increased sway and poor negotiation of obstacles, especially on her R. Gait Gait: Yes Gait Pattern: Step-through pattern;Shuffle;Trunk flexed;Narrow base of support;Scissoring;Decreased weight shift to left;Right flexed knee in stance;Left flexed knee in stance Stairs / Additional Locomotion Stairs: Yes Stairs Assistance: 4: Min assist Stairs Assistance Details: Verbal cues for sequencing;Verbal cues for technique;Verbal cues for precautions/safety;Manual facilitation for weight shifting Stairs Assistance Details (indicate cue type and reason): Patient reports she normally negotiates STE her house sideways with one railing, but requests to negotiate stairs forwards with 2 rails today. Stair Management Technique: Two rails;Forwards Number of Stairs: 3 Height of Stairs: 6 Wheelchair Mobility Wheelchair Mobility: Yes Wheelchair Assistance: 4: Administrator, sports Details: Verbal cues for sequencing;Verbal cues for technique;Verbal cues for precautions/safety;Manual facilitation for Water quality scientist: Both lower extermities Wheelchair Parts Management: Needs assistance Distance: 70  Trunk/Postural Assessment  Cervical Assessment Cervical Assessment: Within Functional Limits (forward  head posture) Thoracic Assessment Thoracic Assessment: Within Functional Limits (increased thoracic flexion) Lumbar Assessment Lumbar Assessment: Within Functional Limits Postural Control Postural Control: Deficits on evaluation Postural Limitations: Patient demonstrates R lateral trunk lean during standing and gait.  Balance Balance Balance Assessed: Yes Static Sitting Balance Static Sitting - Balance Support: Feet supported;Bilateral upper extremity supported Static Sitting - Level of Assistance: 5: Stand by assistance Static Sitting - Comment/# of Minutes: 5 min Dynamic Sitting Balance Dynamic Sitting - Balance Support: Feet supported;During functional activity;No upper extremity supported Dynamic Sitting - Level of Assistance: 5: Stand by assistance;4: Min assist Static Standing Balance Static Standing - Balance Support: Bilateral upper extremity supported Static Standing - Level of Assistance: 4: Min assist Static Standing - Comment/# of Minutes: 1-2 min at a time Extremity Assessment  RLE Assessment RLE Assessment: Within Functional Limits (Grossly 4/5) LLE Assessment LLE Assessment: Exceptions to [facility] LLE Strength LLE Overall Strength: Deficits LLE Overall Strength Comments: Grossly 4-/5 strength FIM:  FIM - Bed/Chair Transfer Bed/Chair Transfer Assistive Devices: Arm rests;Walker Bed/Chair Transfer: 4: Bed > Chair or W/C: Min A (steadying Pt. > 75%);4: Chair or W/C > Bed: Min A (steadying Pt. > 75%) FIM - Locomotion: Wheelchair Distance: 70 Locomotion: Wheelchair: 2: Travels 50 - 149 ft with minimal assistance (Pt.>75%) FIM - Locomotion: Ambulation Locomotion: Ambulation Assistive Devices: Designer, industrial/product Ambulation/Gait Assistance: 4: Min assist Locomotion: Ambulation: 2: Travels 50 - 149 ft with minimal assistance (Pt.>75%) FIM - Locomotion: Stairs Locomotion: Building control surveyor: Hand rail - 2 Locomotion: Stairs: 1: Up and Down < 4 stairs with minimal  assistance (Pt.>75%)   Refer to Care Plan for Long Term Goals  Recommendations for other services: None  Discharge Criteria: Patient will be discharged from PT if patient refuses treatment 3 consecutive times without medical reason, if treatment goals not met, if there is a change in medical status, if patient makes no progress towards goals or if patient is discharged from hospital.  The above assessment, treatment plan, treatment alternatives and goals were discussed and mutually agreed upon: by patient  Chipper Herb. Kessa Fairbairn, PT, DPT  02/26/2013, 9:54 AM

## 2013-02-26 NOTE — Plan of Care (Signed)
Overall Plan of Care [facility]) Patient Details Name: [name] MRN: [id number] DOB: [birth year]  Diagnosis:  CVA  Co-morbidities: Hypertension  .  Hyperlipidemia  .  GERD (gastroesophageal reflux disease)  .  Dysphagia  appt with Molly Maduro Buccini upcoming  .  Chronic recurrent sinusitis  .  Recurrent UTI  .  Atrial fibrillation  ???  .  Arthritis    Functional Problem List  Patient demonstrates impairments in the following areas: Balance, Bladder, Bowel, Cognition, Endurance, Medication Management, Motor, Nutrition, Pain and Vision  Basic ADL's: grooming, bathing, dressing and toileting Advanced ADL's: simple meal preparation  Transfers:  bed mobility, bed to chair, toilet, tub/shower, car and furniture Locomotion:  ambulation and wheelchair mobility  Additional Impairments:  Communication  expression, Social Cognition   memory and attention and Discharge Disposition  Anticipated Outcomes Item Anticipated Outcome  Eating/Swallowing    Basic self-care  supervision  Tolieting  supervision  Bowel/Bladder  Continent of B/B.  Transfers  S with rolling walker  Locomotion  S 150' with RW, S 5 stairs with 1 handrail  Communication  Supervision   Cognition  Supervision   Pain  Less than or euqal to .  Safety/Judgment  Intermittent supervision   Other     Therapy Plan: PT Intensity: Minimum of 1-2 x/day ,45 to 90 minutes PT Frequency: 5 out of 7 days PT Duration Estimated Length of Stay: 10-14 days OT Intensity: Minimum of 1-2 x/day, 45 to 90 minutes OT Duration/Estimated Length of Stay: 10-12 days SLP Intensity: Minumum of 1-2 x/day, 30 to 90 minutes SLP Frequency: 5 out of 7 days SLP Duration/Estimated Length of Stay: 7-10 days    Team Interventions: Item RN PT OT SLP SW TR Other  Self Care/Advanced ADL Retraining   x      Neuromuscular Re-Education  x x      Therapeutic Activities  x x x     UE/LE Strength Training/ROM  x x      UE/LE  Coordination Activities  x x      Visual/Perceptual Remediation/Compensation         DME/Adaptive Equipment Instruction  x x      Therapeutic Exercise  x x      Balance/Vestibular Training  x x      Patient/Family Education X x x x     Cognitive Remediation/Compensation  x x x     Functional Mobility Training  x       Ambulation/Gait Training  x       Stair Training  x       Wheelchair Propulsion/Positioning  x       Functional Tourist information centre manager Reintegration  x x      Dysphagia/Aspiration Landscape architect Facilitation    x     Bladder Management x        Bowel Management x        Disease Management/Prevention x        Pain Management x  x      Medication Management x        Skin Care/Wound Management x        Splinting/Orthotics         Discharge Planning  x x x     Psychosocial Support x x x  Team Discharge Planning: Destination: PT-Home ,OT- Home , SLP-Home Projected Follow-up: PT-Home health PT, OT-  Home health OT, SLP- (TBD) Projected Equipment Needs: PT-None recommended by PT, OT- None recommended by OT, SLP-None recommended by SLP Patient/family involved in discharge planning: PT- Patient,  OT-Patient, SLP-Patient  MD ELOS: 10-14 days Medical Rehab Prognosis:  Good Assessment: 79 y.o. right-handed female with history of PAF/tachybradycardia syndrome who had initially been on anti-coagulation with Coumadin years ago but due to unsteady gait and risk of falling Coumadin was discontinued. Admitted 02/20/2013 with slurred speech. Cranial CT scan was negative for acute abnormalities. Now requiring 24/7 rehab MD/RN, CIR level PT,OT. Goals are for Sup level PT /OT.  Treatment team to focus on ADLs and mobility.    See Team Conference Notes for weekly updates to the plan of care

## 2013-02-26 NOTE — H&P (View-Only) (Signed)
Physical Medicine and Rehabilitation Admission H&P    Chief Complaint  Patient presents with  . Code Stroke  : HPI: [name] is a 79 y.o. right-handed female with history of PAF/tachybradycardia syndrome who had initially been on anti-coagulation with Coumadin years ago but due to unsteady gait and risk of falling Coumadin was discontinued. Admitted 02/20/2013 with slurred speech. Cranial CT scan was negative for acute abnormalities. Patient did receive TPA. MRI of the brain not done secondary to pacemaker. Echocardiogram with ejection fraction of 70% without embolism. Carotid Dopplers with no ICA stenosis. EKG did show atrial fibrillation. Neurology followup question recurrent stroke versus TIA. Patient presently maintained on Eliquis for atrial fibrillation as well as stroke prophylaxis. Urine study did show greater than 100,000 group B(S.Agalactiae) and placed on Septra. Physical and occupational therapy evaluations completed. M.D. is requested physical medicine rehabilitation consult to consider inpatient rehabilitation services. Patient was felt to be a good candidate for inpatient rehabilitation services and was admitted for comprehensive rehabilitation program  Review of Systems  Cardiovascular: Positive for palpitations.  Gastrointestinal: Positive for nausea, vomiting and diarrhea.  Reflux  Genitourinary:  Recurrent UTI  Musculoskeletal: Positive for myalgias.  Neurological: Positive for tingling and weakness.  All other systems reviewed and are negative   Past Medical History  Diagnosis Date  . Hypertension   . Hyperlipidemia   . GERD (gastroesophageal reflux disease)   . Dysphagia     appt with Robert Buccini upcoming  . Chronic recurrent sinusitis   . Recurrent UTI   . Atrial fibrillation     ???  . Arthritis   . TIA (transient ischemic attack)    Past Surgical History  Procedure Laterality Date  . Pacemaker insertion    . Back surgery  1985  . Shoulder  surgery  2008    Left  (Dr. Norris)  . Total knee arthroplasty  2000, 2001    bilateral (Dr. Alusio), left 2000, right 2001  . Cataract extraction  1986, 2003    left 1986, right 2003  . Vaginal hysterectomy  1969  . Bilateral salpingoophorectomy  1971  . Tonsillectomy  1951  . Appendectomy  1952  . Mastoidectomy  1970  . Sinus exploration  1972  . Knee arthroscopy  1988, 1995, 2000  . Hemorrhoid surgery  2009  . Repair / reconstruction interphalangeal joint  2004    R thumb  . Cystoscopy  1992  . Insert / replace / remove pacemaker     No family history on file. Social History:  reports that she quit smoking about 31 years ago. Her smoking use included Cigarettes. She has a 15 pack-year smoking history. She does not have any smokeless tobacco history on file. She reports that she does not drink alcohol or use illicit drugs. Allergies: No Known Allergies Medications Prior to Admission  Medication Sig Dispense Refill  . acetaminophen (TYLENOL) 325 MG tablet Take 325 mg by mouth every 6 (six) hours as needed for pain.      . allopurinol (ZYLOPRIM) 100 MG tablet Take 100 mg by mouth 2 (two) times daily.      . butalbital-aspirin-caffeine (FIORINAL) 50-325-40 MG per capsule Take 1 capsule by mouth 2 (two) times daily as needed for pain or headache.      . calcitRIOL (ROCALTROL) 0.25 MCG capsule Take 0.25 mcg by mouth every morning.       . clopidogrel (PLAVIX) 75 MG tablet Take 75 mg by mouth daily with breakfast.      .   CRANBERRY PO Take 2 capsules by mouth 2 (two) times daily.      . dextromethorphan-guaiFENesin (MUCINEX DM) 30-600 MG per 12 hr tablet Take 1 tablet by mouth every 12 (twelve) hours.      . DULoxetine (CYMBALTA) 60 MG capsule Take 60 mg by mouth every morning.       . ipratropium (ATROVENT) 0.06 % nasal spray Place 2 sprays into the nose every morning.      . levothyroxine (SYNTHROID, LEVOTHROID) 25 MCG tablet Take 25 mcg by mouth every morning.       . lisinopril  (PRINIVIL,ZESTRIL) 20 MG tablet Take 20 mg by mouth daily.      . Multiple Vitamin (MULTIVITAMIN WITH MINERALS) TABS Take 1 tablet by mouth every morning.       . omega-3 acid ethyl esters (LOVAZA) 1 G capsule Take 1 g by mouth every morning.       . pantoprazole (PROTONIX) 40 MG tablet Take 40 mg by mouth 2 (two) times daily.      . potassium chloride (K-DUR) 10 MEQ tablet Take 20 mEq by mouth 2 (two) times daily.      . simvastatin (ZOCOR) 40 MG tablet Take 40 mg by mouth every evening.      . sotalol (BETAPACE) 120 MG tablet Take 120 mg by mouth 2 (two) times daily.      . tolterodine (DETROL LA) 4 MG 24 hr capsule Take 4 mg by mouth at bedtime.        Home: Home Living Lives With: Son Available Help at Discharge: Family;Available PRN/intermittently Type of Home: House Home Access: Stairs to enter Entrance Stairs-Number of Steps: 4 Entrance Stairs-Rails: Right Home Layout: Two level Alternate Level Stairs-Number of Steps: 14 Alternate Level Stairs-Rails: Can reach both;Right;Left Bathroom Shower/Tub: Walk-in shower;Door Bathroom Toilet: Standard Home Adaptive Equipment: Walker - rolling;Bedside commode/3-in-1;Shower chair with back Additional Comments: pt reports she has several RWs at home   Functional History: Prior Function Meal Prep: Total Light Housekeeping: Total Able to Take Stairs?: Yes Driving: No Vocation: Retired Comments: was receiving HHPT PTA  Functional Status:  Mobility: Bed Mobility Bed Mobility: Sit to Sidelying Left Rolling Right: 4: Min assist Rolling Left: 4: Min assist;With rail Right Sidelying to Sit: 3: Mod assist Left Sidelying to Sit: 4: Min assist Sitting - Scoot to Edge of Bed: 4: Min assist;With rail Sit to Sidelying Left: 4: Min assist;HOB flat;With rail Transfers Transfers: Sit to Stand;Stand to Sit Sit to Stand: 4: Min assist;With upper extremity assist;From bed Stand to Sit: 4: Min assist;With upper extremity assist;With armrests;To  chair/3-in-1 Ambulation/Gait Ambulation/Gait Assistance: 4: Min assist Ambulation Distance (Feet): 12 Feet Assistive device: Rolling walker Ambulation/Gait Assistance Details: pt with excessive trunk flexion, minA to keep walker close to patient, limited by fatigue Gait Pattern: Step-through pattern;Shuffle;Trunk flexed;Narrow base of support Gait velocity: decreased Stairs: No    ADL: ADL Eating/Feeding: Simulated;Independent Where Assessed - Eating/Feeding: Chair Grooming: Simulated;Set up Where Assessed - Grooming: Unsupported sitting Upper Body Bathing: Simulated;Set up Where Assessed - Upper Body Bathing: Unsupported sitting Lower Body Bathing: Simulated;Minimal assistance (with Mod A maintaining standing ) Where Assessed - Lower Body Bathing: Supported sit to stand Upper Body Dressing: Simulated;Set up Where Assessed - Upper Body Dressing: Unsupported sitting Lower Body Dressing: Minimal assistance;Simulated (with Mod A to maintain standing) Where Assessed - Lower Body Dressing: Supported sit to stand Toilet Transfer: Performed;Moderate assistance Toilet Transfer Method: Stand pivot Toilet Transfer Equipment: Bedside commode Equipment Used: Rolling walker Transfers/Ambulation   Related to ADLs: Min A sit to stand and stand to sit, with Mod A for ambulation for turning from bed to 3n1 and then 3n1 to recliner ADL Comments: Pt needed to get to the [facility] upon arrival. reports that she had diarrhea most of the night and day as well as emesis. While standing to get clean pt said she was getting nauseated (she sat down and threw up green bile and then had the dry heaves)  Cognition: Cognition Overall Cognitive Status: Impaired Arousal/Alertness: Awake/alert Orientation Level: Oriented X4 Attention: Selective Selective Attention: Impaired Selective Attention Impairment: Verbal basic;Functional basic Memory: Impaired Memory Impairment: Storage deficit;Retrieval deficit Awareness:  Impaired Problem Solving: Impaired Problem Solving Impairment: Verbal basic;Functional basic Behaviors: Perseveration Safety/Judgment: Impaired Cognition Overall Cognitive Status: Appears within functional limits for tasks assessed/performed Area of Impairment: Attention;Awareness of errors Difficult to assess due to: Other (comment) (expressive difficulties) Arousal/Alertness: Awake/alert Orientation Level: Oriented X4 / Intact Behavior During Session: WFL for tasks performed Current Attention Level: Selective Attention - Other Comments: decreased sustained attention,  Awareness of Errors: Assistance required to identify errors made Awareness of Errors - Other Comments: needed cues to stay inside of RW, was drifting to the right while walking- pt unaware that she was doing this.   Cognition - Other Comments: pt talking about one thing and within a minute switches to another unrelated topic.    Physical Exam: Blood pressure 149/55, pulse 66, temperature 98 F (36.7 C), temperature source Oral, resp. rate 18, height 5' 5" (1.651 m), weight 65.409 kg (144 lb 3.2 oz), SpO2 100.00%. Physical Exam  Vitals reviewed.  HENT:  Head: Normocephalic.  Eyes: EOM are normal. anicteric Neck: Neck supple. No thyromegaly present. No jvd or lymphadenopathy Cardiovascular:  Cardiac rate controlled  Pulmonary/Chest: Breath sounds normal. No respiratory distress.  Abdominal: Bowel sounds are normal. She exhibits no distension.  Musculoskeletal: She exhibits no edema.  Neurological: She is alert.  Patient was able to provide place and age. She could not recall the year. Her speech was mildly slurred. Minimal facial asymmetry. She did follow basic commands. Reasonable insight and awareness. Mild left sided weakness (3+ left deltoid, tricep, bic, and 4- WE and HI,  and 3+ HF, KE to 4/5 distally.  Right upper and lower ext 4+. No sensory deficits.  Skin: Skin is warm and dry.     Results for orders  placed during the hospital encounter of 02/20/13 (from the past 48 hour(s))  CBC     Status: Abnormal   Collection Time    02/24/13  8:45 AM      Result Value Range   WBC 9.2  4.0 - 10.5 K/uL   RBC 3.57 (*) 3.87 - 5.11 MIL/uL   Hemoglobin 9.9 (*) 12.0 - 15.0 g/dL   HCT 31.5 (*) 36.0 - 46.0 %   MCV 88.2  78.0 - 100.0 fL   MCH 27.7  26.0 - 34.0 pg   MCHC 31.4  30.0 - 36.0 g/dL   RDW 18.8 (*) 11.5 - 15.5 %   Platelets 236  150 - 400 K/uL  BASIC METABOLIC PANEL     Status: Abnormal   Collection Time    02/24/13  8:45 AM      Result Value Range   Sodium 140  135 - 145 mEq/L   Potassium 3.6  3.5 - 5.1 mEq/L   Chloride 103  96 - 112 mEq/L   CO2 26  19 - 32 mEq/L   Glucose, Bld 133 (*)   70 - 99 mg/dL   BUN 8  6 - 23 mg/dL   Creatinine, Ser 0.85  0.50 - 1.10 mg/dL   Calcium 9.8  8.4 - 10.5 mg/dL   GFR calc non Af Amer 63 (*) >90 mL/min   GFR calc Af Amer 74 (*) >90 mL/min   Comment:            The eGFR has been calculated     using the CKD EPI equation.     This calculation has not been     validated in all clinical     situations.     eGFR's persistently     <90 mL/min signify     possible Chronic Kidney Disease.   No results found.  Post Admission Physician Evaluation: 1. Functional deficits secondary  to left brain CVA. 2. Patient is admitted to receive collaborative, interdisciplinary care between the physiatrist, rehab nursing staff, and therapy team. 3. Patient's level of medical complexity and substantial therapy needs in context of that medical necessity cannot be provided at a lesser intensity of care such as a SNF. 4. Patient has experienced substantial functional loss from his/her baseline which was documented above under the "Functional History" and "Functional Status" headings.  Judging by the patient's diagnosis, physical exam, and functional history, the patient has potential for functional progress which will result in measurable gains while on inpatient rehab.   These gains will be of substantial and practical use upon discharge  in facilitating mobility and self-care at the household level. 5. Physiatrist will provide 24 hour management of medical needs as well as oversight of the therapy plan/treatment and provide guidance as appropriate regarding the interaction of the two. 6. 24 hour rehab nursing will assist with bladder management, bowel management, safety, skin/wound care, disease management, medication administration, pain management and patient education  and help integrate therapy concepts, techniques,education, etc. 7. PT will assess and treat for/with: Lower extremity strength, range of motion, stamina, balance, functional mobility, safety, adaptive techniques and equipment, NMR, pain mgt, education.   Goals are: supervision to mod I. 8. OT will assess and treat for/with: ADL's, functional mobility, safety, upper extremity strength, adaptive techniques and equipment, NMR, safety.   Goals are: supervision. 9. SLP will assess and treat for/with: n/aa.  Goals are: n/a. 10. Case Management and Social Worker will assess and treat for psychological issues and discharge planning. 11. Team conference will be held weekly to assess progress toward goals and to determine barriers to discharge. 12. Patient will receive at least 3 hours of therapy per day at least 5 days per week. 13. ELOS: 7-10 days      Prognosis:  excellent   Medical Problem List and Plan: 1. 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 MRI not completed secondary to pacemaker 2. DVT Prophylaxis/Anticoagulation: SCDs. Monitor for any signs of DVT 3. Neuropsych: This patient is capable of making decisions on his/her own behalf. 4. Hypertension/PAF. Betapace 120 mg twice a day, eliquis 5 mg twice a day 5. Hyperlipidemia. Zocor/lovaza 6. Hypothyroidism. Synthroid 7. Recurrent UTI. Latest urinalysis study shows greater 100,000 group B(S.agalactiae). Continue Bactrim for now. Patient denies dysuria 8.  History of gout. Allopurinol twice a day. Monitor for signs of flare up  Zachary T. Swartz, MD, FAAPMR  02/25/2013 

## 2013-02-26 NOTE — Progress Notes (Signed)
INITIAL NUTRITION ASSESSMENT  DOCUMENTATION CODES Per approved criteria  -Severe malnutrition in the context of chronic illness   INTERVENTION: 1. Resource Breeze po BID, each supplement provides 250 kcal and 9 grams of protein. 2. MVI daily 3. RD to continue to follow nutrition care plan  NUTRITION DIAGNOSIS: Inadequate oral intake related to poor appetite as evidenced by pt report.   Goal: Intake to meet >90% of estimated nutrition needs.  Monitor:  weight trends, lab trends, I/O's, PO intake, supplement tolerance  Reason for Assessment: Health History  79 y.o. female  Admitting Dx: CVA (cerebral infarction)  ASSESSMENT: Hx of PAF/tachybradycardia syndrome. Admitted 3/20 with slurred speech. Neurology followup question recurrent stroke versus TIA. Urine study did show greater than 100,000 group B(S.Agalactiae) and placed on Septra.  Per EPIC weight hx, pt with 9.5% wt loss x 4 months. This is significant. She confirms that her intake has been poor for quite some time. She notes that foods taste bland to her. She states that her son has offered to bring her any foods she likes from outside of hospital however nothing sounds appealing to her.  Pt meets criteria for severe MALNUTRITION in the context of chronic illness as evidenced by intake of <75% of estimated energy intake x at least 1 month and wt loss of 9.5% x 4 months.  Does not like Ensure-type supplements. Agreeable to trying Breeze. Discussed menu options.   Height: Ht Readings from Last 1 Encounters:  02/22/13 5\' 5"  (1.651 m)    Weight: Wt Readings from Last 1 Encounters:  02/26/13 144 lb 3.2 oz (65.409 kg)    Ideal Body Weight: 125 lb  % Ideal Body Weight: 115%  Wt Readings from Last 10 Encounters:  02/26/13 144 lb 3.2 oz (65.409 kg)  02/22/13 144 lb 3.2 oz (65.409 kg)  11/03/12 159 lb 2.8 oz (72.2 kg)  09/03/12 159 lb 13.3 oz (72.5 kg)  06/12/12 157 lb (71.215 kg)    Usual Body Weight: 159 lb  %  Usual Body Weight: 90.5%  BMI:  Body mass index is 24 kg/(m^2). Weight is WNL.  Estimated Nutritional Needs: Kcal: 1500 - 1800 Protein: 70 - 80 grams Fluid: 1.8 - 2 liters daily  Skin: intact  Diet Order: Cardiac  EDUCATION NEEDS: -No education needs identified at this time   Intake/Output Summary (Last 24 hours) at 02/26/13 1140 Last data filed at 02/26/13 0700  Gross per 24 hour  Intake    240 ml  Output      0 ml  Net    240 ml    Last BM: 3/25  Labs:   Recent Labs Lab 02/20/13 1613 02/20/13 1626 02/24/13 0845 02/26/13 0555  NA 139 140 140 139  K 3.7 3.7 3.6 3.4*  CL 103 105 103 103  CO2 25  --  26 28  BUN 15 16 8 10   CREATININE 1.10 1.20* 0.85 1.03  CALCIUM 9.6  --  9.8 9.0  GLUCOSE 94 92 133* 107*    CBG (last 3)  No results found for this basename: GLUCAP,  in the last 72 hours  Scheduled Meds: . allopurinol  100 mg Oral BID  . apixaban  5 mg Oral BID  . calcitRIOL  0.25 mcg Oral q morning - 10a  . DULoxetine  60 mg Oral q morning - 10a  . levothyroxine  25 mcg Oral QAC breakfast  . multivitamin with minerals  1 tablet Oral q morning - 10a  . omega-3 acid  ethyl esters  1 g Oral q morning - 10a  . pantoprazole  40 mg Oral BID  . potassium chloride  10 mEq Oral BID  . simvastatin  40 mg Oral QPM  . sotalol  120 mg Oral BID  . sulfamethoxazole-trimethoprim  1 tablet Oral Q12H    Continuous Infusions:   Past Medical History  Diagnosis Date  . Hypertension   . Hyperlipidemia   . GERD (gastroesophageal reflux disease)   . Dysphagia     appt with Molly Maduro Buccini upcoming  . Chronic recurrent sinusitis   . Recurrent UTI   . Atrial fibrillation     ???  . Arthritis   . TIA (transient ischemic attack)   . Anemia   . Hypothyroidism     Past Surgical History  Procedure Laterality Date  . Pacemaker insertion    . Back surgery  1985  . Shoulder surgery  2008    Left  (Dr. Ranell Patrick)  . Total knee arthroplasty  2000, 2001    bilateral (Dr.  Despina Hick), left 2000, right 2001  . Cataract extraction  1986, 2003    left 1986, right 2003  . Vaginal hysterectomy  1969  . Bilateral salpingoophorectomy  1971  . Tonsillectomy  1951  . Appendectomy  1952  . Mastoidectomy  1970  . Sinus exploration  1972  . Knee arthroscopy  1988, 1995, 2000  . Hemorrhoid surgery  2009  . Repair / reconstruction interphalangeal joint  2004    R thumb  . Cystoscopy  1992  . Insert / replace / remove pacemaker      Jarold Motto MS, RD, LDN Pager: [phone] After-hours pager: [phone]

## 2013-02-26 NOTE — Progress Notes (Signed)
Patient information reviewed and entered into eRehab system by Chrisha Vogel, RN, CRRN, PPS Coordinator.  Information including medical coding and functional independence measure will be reviewed and updated through discharge.     Per nursing patient was given "Data Collection Information Summary for Patients in Inpatient Rehabilitation Facilities with attached "Privacy Act Statement-Health Care Records" upon admission.  

## 2013-02-26 NOTE — Progress Notes (Signed)
Social Work Patient ID: [name], female   DOB: [birth year], 79 y.o.   MRN: [id number] Spoke with son via telephone to inform team conference goals-supervision/min level and discharge 4/1.  He was so glad She got a bed up on rehab, feel it will help with her function.  He would like to use [facility] since she had them before. He will Plan to come up during her therapies to see her progress later in her stay.

## 2013-02-26 NOTE — Progress Notes (Signed)
Social Work Assessment and Plan Social Work Assessment and Plan  Patient Details  Name: [name] MRN: [id number] Date of Birth: [birth year]  Today's Date: 02/26/2013  Problem List:  Patient Active Problem List  Diagnosis  . TIA (transient ischemic attack)  . Hypertension  . Hyperlipidemia  . Microscopic hematuria  . Anemia  . UTI (lower urinary tract infection)  . Altered mental state  . PNA (pneumonia)  . Atrial fibrillation  . Hypokalemia  . Physical deconditioning  . CVA (cerebral infarction)   Past Medical History:  Past Medical History  Diagnosis Date  . Hypertension   . Hyperlipidemia   . GERD (gastroesophageal reflux disease)   . Dysphagia     appt with Molly Maduro Buccini upcoming  . Chronic recurrent sinusitis   . Recurrent UTI   . Atrial fibrillation     ???  . Arthritis   . TIA (transient ischemic attack)   . Anemia   . Hypothyroidism    Past Surgical History:  Past Surgical History  Procedure Laterality Date  . Pacemaker insertion    . Back surgery  1985  . Shoulder surgery  2008    Left  (Dr. Ranell Patrick)  . Total knee arthroplasty  2000, 2001    bilateral (Dr. Despina Hick), left 2000, right 2001  . Cataract extraction  1986, 2003    left 1986, right 2003  . Vaginal hysterectomy  1969  . Bilateral salpingoophorectomy  1971  . Tonsillectomy  1951  . Appendectomy  1952  . Mastoidectomy  1970  . Sinus exploration  1972  . Knee arthroscopy  1988, 1995, 2000  . Hemorrhoid surgery  2009  . Repair / reconstruction interphalangeal joint  2004    R thumb  . Cystoscopy  1992  . Insert / replace / remove pacemaker     Social History:  reports that she quit smoking about 31 years ago. Her smoking use included Cigarettes. She has a 15 pack-year smoking history. She does not have any smokeless tobacco history on file. She reports that she does not drink alcohol or use illicit drugs.  Family / Support Systems Marital Status: Widow/Widower Patient Roles:  Parent Children: Josephina Gip  [phone]  [phone] Anticipated Caregiver: [name] Ability/Limitations of Caregiver: At times needs to leave for work but mostly there with her Caregiver Availability: Other (Comment) (Almost 24 hour care) Family Dynamics: Son has provided care for years to Mom.  She feels he is her Godsent, he has always been there for me.  She has four other children but are all out of town.  She and son only ones here locally.  Social History Preferred language: English Religion: Christian Cultural Background: No issues Education: High School Read: Yes Write: Yes Employment Status: Retired Fish farm manager Issues: No issues Guardian/Conservator: None-according to MD pt is capable of making her own decisions, will also include son   Abuse/Neglect Physical Abuse: Denies Verbal Abuse: Denies Sexual Abuse: Denies Exploitation of patient/patient's resources: Denies Self-Neglect: Denies  Emotional Status Pt's affect, behavior adn adjustment status: Pt is motivated to [employer] and get back to her current function.  She feels sometimes old age is not fun.  She states: " I am tired of all of these health issues."  She does her best with what she has. Recent Psychosocial Issues: Other medical issues Pyschiatric History: History of depression-takes cymbalta and feels this helps her.  Depression screen score-2.  She feels this is normal for her and age related illnesses. Substance Abuse  History: No issues  Patient / Family Perceptions, Expectations & Goals Pt/Family understanding of illness & functional limitations: Pt and son can explain her conditions and health issues.  He is involved in her care and medical care.  He visits daily and provides Mom with much support.  Pt is happy she has gotten her function back, but her head pain worries her.  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, Retiree, grandmother, Home owner,  etc Anticipated changes in roles/activities/participation: resume Pt/family expectations/goals: Pt states: ' I want to be steady on my feet, but feel I am almost there."  Son states: " I want her to be able to move around more and be less fall risk."  [employer]: Other (Comment) (Did have [facility] d/c before hospital stay) Premorbid Home Care/DME Agencies: None Transportation available at discharge: Son Resource referrals recommended: Support group (specify) (CVA Support group)  Discharge Planning Living Arrangements: Children Support Systems: Children;Friends/neighbors;Church/faith community Type of Residence: Private residence Financial Resources: Social Security;Family Support Financial Screen Referred: No Living Expenses: Lives with family Money Management: Family Do you have any problems obtaining your medications?: No Home Management: Son does home managment Patient/Family Preliminary Plans: Return home with son who is there most of the time, works from home.  There are times he has to leave but only few hours at most.  Both make the best of the situation and pt is careful when son not there. Social Work Anticipated Follow Up Needs: HH/OP;Support Group  Clinical Impression Pleasant female who is motivated but not happy she keeps having health issues.  Son is supportive and committed to pt and her care.  Will come in to observe Mom in therapies and Participate in her care.   Lucy Chris 02/26/2013, 4:10 PM

## 2013-02-26 NOTE — Evaluation (Signed)
Speech Language Pathology Assessment and Plan  Patient Details  Name: [name] MRN: [id number] Date of Birth: [birth year]  SLP Diagnosis: Cognitive Impairments  Rehab Potential: Good ELOS: 7-10 days   Today's Date: 02/26/2013 Time: 7829-5621 Time Calculation (min): 55 min  Problem List:  Patient Active Problem List  Diagnosis  . TIA (transient ischemic attack)  . Hypertension  . Hyperlipidemia  . Microscopic hematuria  . Anemia  . UTI (lower urinary tract infection)  . Altered mental state  . PNA (pneumonia)  . Atrial fibrillation  . Hypokalemia  . Physical deconditioning  . CVA (cerebral infarction)   Past Medical History:  Past Medical History  Diagnosis Date  . Hypertension   . Hyperlipidemia   . GERD (gastroesophageal reflux disease)   . Dysphagia     appt with Molly Maduro Buccini upcoming  . Chronic recurrent sinusitis   . Recurrent UTI   . Atrial fibrillation     ???  . Arthritis   . TIA (transient ischemic attack)   . Anemia   . Hypothyroidism    Past Surgical History:  Past Surgical History  Procedure Laterality Date  . Pacemaker insertion    . Back surgery  1985  . Shoulder surgery  2008    Left  (Dr. Ranell Patrick)  . Total knee arthroplasty  2000, 2001    bilateral (Dr. Despina Hick), left 2000, right 2001  . Cataract extraction  1986, 2003    left 1986, right 2003  . Vaginal hysterectomy  1969  . Bilateral salpingoophorectomy  1971  . Tonsillectomy  1951  . Appendectomy  1952  . Mastoidectomy  1970  . Sinus exploration  1972  . Knee arthroscopy  1988, 1995, 2000  . Hemorrhoid surgery  2009  . Repair / reconstruction interphalangeal joint  2004    R thumb  . Cystoscopy  1992  . Insert / replace / remove pacemaker      Assessment / Plan / Recommendation Clinical Impression  [name] is a 79 y.o. right-handed female with history of PAF/tachybradycardia syndrome who had initially been on anti-coagulation with Coumadin years ago but due to  unsteady gait and risk of falling Coumadin was discontinued. Admitted 02/20/2013 with slurred speech; patient did receive TPA; MRI of the brain not done secondary to pacemaker. Echocardiogram with ejection fraction of 70% without embolism; EKG did show atrial fibrillation. Neurology follow-up question recurrent stroke versus TIA. Patient presently maintained on Eliquis for atrial fibrillation as well as stroke prophylaxis. Urine study did show greater than 100,000 group B(S.Agalactiae) and placed on Septra. Physical and occupational therapy evaluations completed. M.D. requested physical medicine rehabilitation consult to consider inpatient rehabilitation services. Patient was felt to be a good candidate for inpatient rehabilitation services and was admitted for comprehensive rehabilitation program 02/25/13.  Cognitive-Linguistic evaluation revealed cognitive deficits characterized by decreased storage and retrieval abilities as well as some decreased mental flexibility and problem solving.  It is recommended that therapies utilize repetition and semantic cues to assist with these deficits.  It is also recommended that patient receive skilled SLP services during CIR stay to address these deficits and to maximize functional independence and reduce burden of care upon discharge home with son.     SLP Assessment  Patient will need skilled Speech Lanaguage Pathology Services during CIR admission    Recommendations  Patient destination: Home Follow up Recommendations:  (TBD) Equipment Recommended: None recommended by SLP    SLP Frequency 5 out of 7 days   SLP  Treatment/Interventions Cognitive remediation/compensation;Cueing hierarchy;Environmental controls;Functional tasks;Internal/external aids;Therapeutic Activities    Pain Pain Assessment Pain Assessment: No/denies pain Prior Functioning Cognitive/Linguistic Baseline: Information not available  Short Term Goals: Week 1: SLP Short Term Goal 1 (Week  1): Patient will utilize external aids to assist with recall of daily information with supervision level verbal cues SLP Short Term Goal 2 (Week 1): Patient will utilize retrieval strategies with supervision level verbal cues. SLP Short Term Goal 3 (Week 1): Patient will select attention to basic familiar tasks with supervision level verbal cues for re-directions.  See FIM for current functional status Refer to Care Plan for Long Term Goals  Recommendations for other services: None  Discharge Criteria: Patient will be discharged from SLP if patient refuses treatment 3 consecutive times without medical reason, if treatment goals not met, if there is a change in medical status, if patient makes no progress towards goals or if patient is discharged from hospital.  The above assessment, treatment plan, treatment alternatives and goals were discussed and mutually agreed upon: by patient  Fae Pippin, M.A., CCC-SLP [phone]  Demetri Goshert 02/26/2013, 4:29 PM

## 2013-02-26 NOTE — Patient Care Conference (Signed)
Inpatient RehabilitationTeam Conference and Plan of Care Update Date: 02/26/2013   Time: 11:00 AM    Patient Name: [name]      Medical Record Number: [id number]  Date of Birth: Oct 05, 1931 Sex: Female         Room/Bed: 4011/4011-01 Payor Info: Payor: MEDICARE  Plan: MEDICARE PART A AND B  Product Type: *No Product type*     Admitting Diagnosis: LT VCA  Admit Date/Time:  02/25/2013  9:55 PM Admission Comments: No comment available   Primary Diagnosis:  CVA (cerebral infarction) Principal Problem: CVA (cerebral infarction)  Patient Active Problem List   Diagnosis Date Noted  . CVA (cerebral infarction) 02/26/2013  . Physical deconditioning 11/07/2012  . Hypokalemia 11/06/2012  . Atrial fibrillation 11/04/2012  . PNA (pneumonia) 11/02/2012  . UTI (lower urinary tract infection) 09/05/2012  . Altered mental state 09/05/2012  . TIA (transient ischemic attack) 06/12/2012  . Hypertension 06/12/2012  . Hyperlipidemia 06/12/2012  . Microscopic hematuria 06/12/2012  . Anemia 06/12/2012    Expected Discharge Date: Expected Discharge Date: 03/04/13  Team Members Present: Physician leading conference: Dr. Claudette Laws Social Worker Present: Dossie Der, LCSW Nurse Present: Rosalio Macadamia, RN PT Present: Edman Circle, PT;Other (comment) Clarisse Gouge Ripa-PT) OT Present: Leonette Monarch, Felipa Eth, OT SLP Present: Fae Pippin, SLP Other (Discipline and Name): Charolette Child Coordinator     Current Status/Progress Goal Weekly Team Focus  Medical   prior hx of freq falls was not on warfarin due to fall hx  maintain HR, BP  med management   Bowel/Bladder   Pt is continent of bowel and bladder  Pt will remain continent of bowel and bladder  Continue to monitor   Swallow/Nutrition/ Hydration     na        ADL's     eval pending        Mobility     eval pending        Communication   eval pending          Safety/Cognition/ Behavioral Observations  eval pending           Pain   Pt c/o slight headache relieved with tylenol  Pt will rate pain less than 3 on sclae of 0-10  Continue to monitor   Skin   Skin intact  Skin will remain intact throughout admission  Monitor Qshift and prn      *See Interdisciplinary Assessment and Plan and progress notes for long and short-term goals  Barriers to Discharge: initiating therapy program, not fully evaluated    Possible Resolutions to Barriers:  see above    Discharge Planning/Teaching Needs:    Home with son who can provide intermittent supervision      Team Discussion:  Pt making good prgress and getting return.  Speech to eval for memory deficits.  Son to provide intermittent assist  Revisions to Treatment Plan:  New eval   Continued Need for Acute Rehabilitation Level of Care: The patient requires daily medical management by a physician with specialized training in physical medicine and rehabilitation for the following conditions: Daily direction of a multidisciplinary physical rehabilitation program to ensure safe treatment while eliciting the highest outcome that is of practical value to the patient.: Yes Daily medical management of patient stability for increased activity during participation in an intensive rehabilitation regime.: Yes Daily analysis of laboratory values and/or radiology reports with any subsequent need for medication adjustment of medical intervention for : Neurological problems;Cardiac problems  Roran Wegner, Lurena Joiner  G 02/27/2013, 9:10 AM

## 2013-02-26 NOTE — Evaluation (Signed)
Occupational Therapy Assessment and Plan  Patient Details  Name: [name] MRN: [id number] Date of Birth: 1931/05/10  OT Diagnosis: abnormal posture, cognitive deficits and muscle weakness (generalized) Rehab Potential: Rehab Potential: Good ELOS: 10-12 days   Today's Date: 02/26/2013 Time: 1002-1111 Time Calculation (min): 69 min  Problem List:  Patient Active Problem List  Diagnosis  . TIA (transient ischemic attack)  . Hypertension  . Hyperlipidemia  . Microscopic hematuria  . Anemia  . UTI (lower urinary tract infection)  . Altered mental state  . PNA (pneumonia)  . Atrial fibrillation  . Hypokalemia  . Physical deconditioning  . CVA (cerebral infarction)    Past Medical History:  Past Medical History  Diagnosis Date  . Hypertension   . Hyperlipidemia   . GERD (gastroesophageal reflux disease)   . Dysphagia     appt with Molly Maduro Buccini upcoming  . Chronic recurrent sinusitis   . Recurrent UTI   . Atrial fibrillation     ???  . Arthritis   . TIA (transient ischemic attack)   . Anemia   . Hypothyroidism    Past Surgical History:  Past Surgical History  Procedure Laterality Date  . Pacemaker insertion    . Back surgery  1985  . Shoulder surgery  2008    Left  (Dr. Ranell Patrick)  . Total knee arthroplasty  2000, 2001    bilateral (Dr. Despina Hick), left 2000, right 2001  . Cataract extraction  1986, 2003    left 1986, right 2003  . Vaginal hysterectomy  1969  . Bilateral salpingoophorectomy  1971  . Tonsillectomy  1951  . Appendectomy  1952  . Mastoidectomy  1970  . Sinus exploration  1972  . Knee arthroscopy  1988, 1995, 2000  . Hemorrhoid surgery  2009  . Repair / reconstruction interphalangeal joint  2004    R thumb  . Cystoscopy  1992  . Insert / replace / remove pacemaker      Assessment & Plan Clinical Impression: Patient is a 79 y.o. year old female with recent admission to the hospital on 02/20/2013 with slurred speech. Cranial CT scan was  negative for acute abnormalities. Patient did receive TPA. MRI of the brain not done secondary to pacemaker with.  Patient transferred to CIR on 02/25/2013 .    Patient currently requires min with basic self-care skills secondary to muscle weakness, unbalanced muscle activation and decreased awareness, decreased problem solving and decreased memory.  Prior to hospitalization, patient could complete ADLS with supervision to modified independent.  Patient will benefit from skilled intervention to decrease level of assist with basic self-care skills and increase independence with basic self-care skills prior to discharge home with her son.  Anticipate patient will require 24 hour supervision and follow up home health.  OT - End of Session Activity Tolerance: Tolerates 10 - 20 min activity with multiple rests Endurance Deficit: Yes OT Assessment Rehab Potential: Good Barriers to Discharge: None OT Plan OT Intensity: Minimum of 1-2 x/day, 45 to 90 minutes OT Duration/Estimated Length of Stay: 10-12 days OT Treatment/Interventions: Balance/vestibular training;Cognitive remediation/compensation;Community reintegration;Discharge planning;DME/adaptive equipment instruction;Patient/family education;Pain management;Neuromuscular re-education;Functional mobility training;Self Care/advanced ADL retraining;Therapeutic Activities;UE/LE Strength taining/ROM;UE/LE Coordination activities;Visual/perceptual remediation/compensation OT Recommendation Patient destination: Home Follow Up Recommendations: Home health OT Equipment Recommended: None recommended by OT   Skilled Therapeutic Intervention   OT Evaluation Precautions/Restrictions  Precautions Precautions: Fall;ICD/Pacemaker Precaution Comments: h/o recurrent falls at home Restrictions Weight Bearing Restrictions: No General   Vital Signs   Pain Pain  Assessment Pain Assessment: 0-10 Pain Score:   8 Pain Type: Acute pain Pain Location:  Head Pain Descriptors: Aching Pain Frequency: Occasional Pain Onset: Gradual Patients Stated Pain Goal: 2 Pain Intervention(s): Medication (See eMAR);Emotional support Home Living/Prior Functioning Home Living Lives With: Son Available Help at Discharge: Family;Available PRN/intermittently (son works during the day; other family live far) Type of Home: House Home Access: Stairs to enter [employer] of Steps: 4 Entrance Stairs-Rails: Right;Left Home Layout: Two level;Able to live on main level with bedroom/bathroom Alternate Level Stairs-Number of Steps: 12-14 Alternate Level Stairs-Rails: Can reach both;Right;Left Bathroom Shower/Tub: Walk-in shower;Door [employer] Toilet: Standard Bathroom Accessibility: Yes How Accessible: Accessible via walker Home Adaptive Equipment: Walker - rolling;Bedside commode/3-in-1;Shower chair with back;Wheelchair - manual Additional Comments: pt reports she has several RWs at home IADL History Homemaking Responsibilities: No Current License: No Occupation: Retired Prior Function Level of Independence: Requires assistive device for independence;Other (comment) (Pt's son with her for gait and transfers, provided steadying) Meal Prep: Maximal Light Housekeeping: Total Able to Take Stairs?: Yes Driving: No Vocation: Retired Comments: Patient was receiving HHPT PTA (secondary to previous stay in hosptail due to PNA) ADL ADL Eating: Independent Where Assessed-Eating: Chair Grooming: Supervision/safety Where Assessed-Grooming: Wheelchair;Sitting at sink Upper Body Bathing: Supervision/safety Where Assessed-Upper Body Bathing: Wheelchair;Sitting at sink Lower Body Bathing: Minimal assistance Where Assessed-Lower Body Bathing: Wheelchair;Sitting at sink;Standing at sink Upper Body Dressing: Supervision/safety Where Assessed-Upper Body Dressing: Wheelchair;Sitting at sink Lower Body Dressing: Minimal assistance Where Assessed-Lower Body  Dressing: Wheelchair;Standing at sink;Sitting at sink Toileting: Minimal assistance Where Assessed-Toileting: Bedside Commode Toilet Transfer: Minimal assistance Toilet Transfer Method: Proofreader: Gaffer: Not assessed Film/video editor: Not assessed ADL Comments: Pt with LOB posteriorly, has history of scoliosis as well with increased weightshift and lean to the left side in standing. Vision/Perception  Vision - History Baseline Vision: Wears glasses for distance only Visual History: Cataracts Patient Visual Report: No change from baseline Vision - Assessment Eye Alignment: Within Functional Limits Vision Assessment: Vision not tested  Cognition Overall Cognitive Status: Impaired Arousal/Alertness: Awake/alert Orientation Level: Oriented X4 Attention: Sustained Sustained Attention: Appears intact Selective Attention: Impaired Selective Attention Impairment: Functional basic Memory: Impaired Memory Impairment: Storage deficit;Retrieval deficit Awareness: Impaired Awareness Impairment: Anticipatory impairment Sensation Sensation Light Touch: Appears Intact Stereognosis: Appears Intact Hot/Cold: Appears Intact Proprioception: Appears Intact Additional Comments: Proprioception intact at B ankle and great toes Coordination Gross Motor Movements are Fluid and Coordinated: Yes Fine Motor Movements are Fluid and Coordinated: Yes Heel Shin Test: Slight dysmetria/overshooting with L LE Motor  Motor Motor: Abnormal postural alignment and control Motor - Skilled Clinical Observations: Mild hemiparesis on L LE, no clonus noted B LE Mobility  Transfers Sit to Stand: 4: Min assist;With upper extremity assist;From chair/3-in-1 Sit to Stand Details: Manual facilitation for weight shifting;Verbal cues for sequencing Sit to Stand Details (indicate cue type and reason): Patient requires verbal cues for proper foot placement  prior to sit>stand. Stand to Sit: 4: Min assist Stand to Sit Details (indicate cue type and reason): Verbal cues for sequencing;Manual facilitation for weight shifting  Trunk/Postural Assessment  Cervical Assessment Cervical Assessment: Within Functional Limits Thoracic Assessment Thoracic Assessment: Within Functional Limits Lumbar Assessment Lumbar Assessment: Exceptions to [facility] Lumbar Strength Overall Lumbar Strength Comments: Pt demonstrates kyphotic posturing with scoliosis deformity to the left..  Unable to tolerate standing for more than 2-3 mins before back fatigues, per her report. Postural Control Postural Control: Deficits on evaluation Postural Limitations: Patient demonstrates  R lateral trunk lean during standing and gait.  Balance Balance Balance Assessed: Yes Static Sitting Balance Static Sitting - Balance Support: No upper extremity supported Static Sitting - Level of Assistance: 5: Stand by assistance Static Sitting - Comment/# of Minutes: 5 min Dynamic Sitting Balance Dynamic Sitting - Balance Support: Feet supported;During functional activity;No upper extremity supported Dynamic Sitting - Level of Assistance: 5: Stand by assistance;4: Min assist Static Standing Balance Static Standing - Balance Support: No upper extremity supported Static Standing - Level of Assistance: 4: Min assist Static Standing - Comment/# of Minutes: pt with LOB posteriorly and decreased hip extension Extremity/Trunk Assessment RUE Assessment RUE Assessment: Within Functional Limits LUE Assessment LUE Assessment: Exceptions to [facility] LUE Strength LUE Overall Strength Comments: Pt with history of left rotator cuff repair.  Demonstrates shoulder flexion 0-100 degrees and can perform AAROM to 140 degrees.  Elbow flexion and extension AROM WFLS as well as digit flexion and extension.  Overall strength 4/5 throughout.  FIM:  FIM - Grooming Grooming Steps: Wash, rinse, dry face;Wash, rinse, dry  hands;Brush, comb hair Grooming: 5: Supervision: safety issues or verbal cues FIM - Bathing Bathing Steps Patient Completed: Chest;Right Arm;Left Arm;Abdomen;Front perineal area;Buttocks;Right upper leg;Left upper leg Bathing: 4: Min-Patient completes 8-9 69f 10 parts or 75+ percent FIM - Upper Body Dressing/Undressing Upper body dressing/undressing steps patient completed: Thread/unthread right sleeve of pullover shirt/dresss;Thread/unthread left sleeve of pullover shirt/dress;Put head through opening of pull over shirt/dress;Pull shirt over trunk Upper body dressing/undressing: 5: Set-up assist to: Obtain clothing/put away FIM - Lower Body Dressing/Undressing Lower body dressing/undressing steps patient completed: Thread/unthread right underwear leg;Thread/unthread left underwear leg;Pull underwear up/down;Thread/unthread right pants leg;Thread/unthread left pants leg;Pull pants up/down Lower body dressing/undressing: 4: Min-Patient completed 75 plus % of tasks FIM - Banker Devices: Therapist, occupational: 4: Chair or W/C > Bed: Min A (steadying Pt. > 75%)   Refer to Care Plan for Long Term Goals  Recommendations for other services: None  Discharge Criteria: Patient will be discharged from OT if patient refuses treatment 3 consecutive times without medical reason, if treatment goals not met, if there is a change in medical status, if patient makes no progress towards goals or if patient is discharged from hospital.  The above assessment, treatment plan, treatment alternatives and goals were discussed and mutually agreed upon: by patient  Began education on balance and safety with performance of basic selfcare tasks sit to stand at the sink.  She needs mod instructional cueing for hand placement with sit to stand and min assist for functional transfers.  Stephinie Battisti OTR/L Pager number [phone] 02/26/2013, 11:59 AM

## 2013-02-27 ENCOUNTER — Inpatient Hospital Stay (HOSPITAL_COMMUNITY): Payer: Medicare Other | Admitting: Occupational Therapy

## 2013-02-27 ENCOUNTER — Inpatient Hospital Stay (HOSPITAL_COMMUNITY): Payer: Medicare Other

## 2013-02-27 ENCOUNTER — Inpatient Hospital Stay (HOSPITAL_COMMUNITY): Payer: Medicare Other | Admitting: *Deleted

## 2013-02-27 ENCOUNTER — Inpatient Hospital Stay (HOSPITAL_COMMUNITY): Payer: Medicare Other | Admitting: Speech Pathology

## 2013-02-27 DIAGNOSIS — I6330 Cerebral infarction due to thrombosis of unspecified cerebral artery: Secondary | ICD-10-CM

## 2013-02-27 MED ORDER — BOOST / RESOURCE BREEZE PO LIQD: 1.0000 | Freq: Two times a day (BID) | ORAL | Status: DC

## 2013-02-27 MED ORDER — CARBAMIDE PEROXIDE 6.5 % OT SOLN
5.0000 [drp] | Freq: Every day | OTIC | Status: AC
  Administered 2013-02-27 – 2013-03-01 (×3): 5 [drp] via OTIC
  Filled 2013-02-27: qty 15

## 2013-02-27 NOTE — Progress Notes (Signed)
Occupational Therapy Session Note  Patient Details  Name: [name] MRN: [id number] Date of Birth: [birth year]  Today's Date: 02/27/2013 Time: 1000-1115 Time Calculation (min): 75 min  Short Term Goals: Week 1:  OT Short Term Goal 1 (Week 1): Pt will peform toilet transfers with close supervision using the RW and 3:1. OT Short Term Goal 2 (Week 1): Pt will peform LB bathing and dressing with supervision sit to stand for 2 consecutive sessions. OT Short Term Goal 3 (Week 1): Pt will perform walk-in shower transfer with supervision using RW to shower seat. OT Short Term Goal 4 (Week 1): Pt will gather all clothes/towels/washcloths prior to shower with no more than min questioning cues.  Skilled Therapeutic Interventions/Progress Updates:    ADL re-training completed in shower this AM. Session with focus on ADL performance, functional transfer, standing balance, functional mobility in bathroom with walker, and safety awareness. One LOB posterior when stand stepping with RW to toilet. Needed verbal cues for safety during RW use and when transferring in and out of shower. Standing balance during LB dressing was Poor+. Pt very slow moving and needed min vc's to remain on task. Pt expressed feeling fatigued at times and took rest breaks when needed.  Therapy Documentation Precautions:  Precautions Precautions: Fall;ICD/Pacemaker Precaution Comments: h/o recurrent falls at home Restrictions Weight Bearing Restrictions: No Pain: Pain Assessment Pain Assessment: No/denies pain Pain Score: 0-No pain  See FIM for current functional status  Therapy/Group: Individual Therapy  Limmie Patricia, OTR/L  02/27/2013, 11:25 AM

## 2013-02-27 NOTE — Progress Notes (Signed)
Patient ID: [name], female   DOB: 1931/03/27, 79 y.o.   MRN: [id number]  Subjective/Complaints: 79 y.o. right-handed female with history of PAF/tachybradycardia syndrome who had initially been on anti-coagulation with Coumadin years ago but due to unsteady gait and risk of falling Coumadin was discontinued. Admitted 02/20/2013 with slurred speech. Cranial CT scan was negative for acute abnormalities. Patient did receive TPA. MRI of the brain not done secondary to pacemaker. Echocardiogram with ejection fraction of 70% without embolism. Carotid Dopplers with no ICA stenosis. EKG did show atrial fibrillation. Neurology followup question recurrent stroke versus TIA. Patient presently maintained on Eliquis for atrial fibrillation as well as stroke prophylaxis. Urine study did show greater than 100,000 group B(S.Agalactiae) and placed on Septra Vomited last noc, nauseated Ear pain for 2 months on R has been seen by PCP for this.  Not sure if she has seen an ENT No fevers Still has nausea PMH:  Hx Left RCT, Post op infection Review of Systems  Gastrointestinal: Positive for nausea, vomiting and abdominal pain. Negative for diarrhea.  Musculoskeletal: Positive for joint pain.  All other systems reviewed and are negative.    Objective: Vital Signs: Blood pressure 163/69, pulse 58, temperature 97.9 F (36.6 C), temperature source Oral, resp. rate 18, weight 65.409 kg (144 lb 3.2 oz), SpO2 98.00%. No results found. Results for orders placed during the hospital encounter of 02/25/13 (from the past 72 hour(s))  CBC WITH DIFFERENTIAL     Status: Abnormal   Collection Time    02/26/13  5:55 AM      Result Value Range   WBC 7.5  4.0 - 10.5 K/uL   RBC 2.99 (*) 3.87 - 5.11 MIL/uL   Hemoglobin 8.8 (*) 12.0 - 15.0 g/dL   HCT 96.2 (*) 95.2 - 84.1 %   MCV 87.6  78.0 - 100.0 fL   MCH 29.4  26.0 - 34.0 pg   MCHC 33.6  30.0 - 36.0 g/dL   RDW 32.4 (*) 40.1 - 02.7 %   Platelets 201  150 - 400 K/uL   Neutrophils Relative 62  43 - 77 %   Neutro Abs 4.6  1.7 - 7.7 K/uL   Lymphocytes Relative 21  12 - 46 %   Lymphs Abs 1.6  0.7 - 4.0 K/uL   Monocytes Relative 12  3 - 12 %   Monocytes Absolute 0.9  0.1 - 1.0 K/uL   Eosinophils Relative 4  0 - 5 %   Eosinophils Absolute 0.3  0.0 - 0.7 K/uL   Basophils Relative 0  0 - 1 %   Basophils Absolute 0.0  0.0 - 0.1 K/uL  COMPREHENSIVE METABOLIC PANEL     Status: Abnormal   Collection Time    02/26/13  5:55 AM      Result Value Range   Sodium 139  135 - 145 mEq/L   Potassium 3.4 (*) 3.5 - 5.1 mEq/L   Chloride 103  96 - 112 mEq/L   CO2 28  19 - 32 mEq/L   Glucose, Bld 107 (*) 70 - 99 mg/dL   BUN 10  6 - 23 mg/dL   Creatinine, Ser 2.53  0.50 - 1.10 mg/dL   Calcium 9.0  8.4 - 66.4 mg/dL   Total Protein 5.7 (*) 6.0 - 8.3 g/dL   Albumin 2.7 (*) 3.5 - 5.2 g/dL   AST 12  0 - 37 U/L   ALT 6  0 - 35 U/L   Alkaline Phosphatase 61  39 - 117 U/L   Total Bilirubin 0.2 (*) 0.3 - 1.2 mg/dL   GFR calc non Af Amer 50 (*) >90 mL/min   GFR calc Af Amer 58 (*) >90 mL/min   Comment:            The eGFR has been calculated     using the CKD EPI equation.     This calculation has not been     validated in all clinical     situations.     eGFR's persistently     <90 mL/min signify     possible Chronic Kidney Disease.     HEENT: normal and no facial droop or weakness Cardio: irregular and no murmurs Resp: CTA B/L and unlabored GI: BS positive and mild diffuse tenderness Extremity:  No Edema Skin:   Intact Neuro: Alert/Oriented, Cranial Nerve II-XII normal, Normal Sensory, Abnormal Motor 3-/5 Left deltoid,4/5 Left bi, tri, grip, 4/5 in RUE and BLE, Abnormal FMC Ataxic/ dec FMC and Tone  Within Normal Limits and Tone:  Within Normal Limits Musc/Skel:  Extremity tender Left shoulder pain with active and passive ROM Gen NAD   Assessment/Plan: 1. 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 Physiatrist is providing close team supervision and 24 hour management of active medical problems listed below. Physiatrist and rehab team continue to assess barriers to discharge/monitor patient progress toward functional and medical goals. FIM: FIM - Bathing Bathing Steps Patient Completed: Chest;Right Arm;Left Arm;Abdomen;Front perineal area;Buttocks;Right upper leg;Left upper leg Bathing: 4: Min-Patient completes 8-9 48f 10 parts or 75+ percent  FIM - Upper Body Dressing/Undressing Upper body dressing/undressing steps patient completed: Thread/unthread right sleeve of pullover shirt/dresss;Thread/unthread left sleeve of pullover shirt/dress;Put head through opening of pull over shirt/dress;Pull shirt over trunk Upper body dressing/undressing: 5: Set-up assist to: Obtain clothing/put away FIM - Lower Body Dressing/Undressing Lower body dressing/undressing steps patient completed: Thread/unthread right underwear leg;Thread/unthread left underwear leg;Pull underwear up/down;Thread/unthread right pants leg;Thread/unthread left pants leg;Pull pants up/down Lower body dressing/undressing: 4: Min-Patient completed 75 plus % of tasks        FIM - Banker Devices: Therapist, occupational: 4: Chair or W/C > Bed: Min A (steadying Pt. > 75%)  FIM - Locomotion: Wheelchair Distance: 70 Locomotion: Wheelchair: 2: Travels 50 - 149 ft with minimal assistance (Pt.>75%) FIM - Locomotion: Ambulation Locomotion: Ambulation Assistive Devices: Designer, industrial/product Ambulation/Gait Assistance: 4: Min assist Locomotion: Ambulation: 2: Travels 50 - 149 ft with minimal assistance (Pt.>75%)  Comprehension Comprehension Mode: Auditory Comprehension: 4-Understands basic 75 - 89% of the time/requires cueing 10 - 24% of the time  Expression Expression Mode: Verbal Expression: 4-Expresses basic 75 - 89%  of the time/requires cueing 10 - 24% of the time. Needs helper to occlude trach/needs to repeat words.  Social Interaction Social Interaction: 5-Interacts appropriately 90% of the time - Needs monitoring or encouragement for participation or interaction.  Problem Solving Problem Solving: 5-Solves basic 90% of the time/requires cueing < 10% of the time  Memory Memory: 4-Recognizes or recalls 75 - 89% of the time/requires cueing 10 - 24% of the time   Medical Problem List and Plan:  1. 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 MRI not completed secondary to pacemaker  2. DVT Prophylaxis/Anticoagulation: SCDs. Monitor for any signs of DVT  3. Neuropsych: This patient is capable of making decisions on his/her own behalf.  4. Hypertension/PAF.  Betapace 120 mg twice a day, eliquis 5 mg twice a day  5. Hyperlipidemia. Zocor/lovaza  6. Hypothyroidism. Synthroid  7. Recurrent UTI. Latest urinalysis study shows greater 100,000 group B(S.agalactiae). Continue Bactrim for now. Patient denies dysuria  8. History of gout. Allopurinol twice a day. Monitor for signs of flare up 9.  Anemia-check stool OB 10.  R ear pain will check with otoscope,consider ENT ? inpt vs outpt, may not have enough diagnostic equipment as inpt LOS (Days) 2 A FACE TO FACE EVALUATION WAS PERFORMED  Cyla Haluska E 02/27/2013, 7:21 AM

## 2013-02-27 NOTE — Progress Notes (Signed)
Speech Language Pathology Daily Session Note  Patient Details  Name: [name] MRN: [id number] Date of Birth: [birth year]  Today's Date: 02/27/2013 Time: 0930-1000 Time Calculation (min): 30 min  Short Term Goals: Week 1: SLP Short Term Goal 1 (Week 1): Patient will utilize external aids to assist with recall of daily information with supervision level verbal cues SLP Short Term Goal 2 (Week 1): Patient will utilize retrieval strategies with supervision level verbal cues. SLP Short Term Goal 3 (Week 1): Patient will select attention to basic familiar tasks with supervision level verbal cues for re-directions.  Skilled Therapeutic Interventions: Skilled treatment session focused on addressing cognitive goals.  SLP entered room to RN giving patient am medications.  Patient required min assist cues to recall functions of medications and mod assist cues to select attention to task of taking medications.  SLP facilitated session with min assist verbal cues to accurately utilize medication box that patient reported using PTA.  Overall throughout session patient required mod assist cues to utilize external aids to assist with recall and demonstrated no instances of anomia.    FIM:  Comprehension Comprehension Mode: Auditory Comprehension: 4-Understands basic 75 - 89% of the time/requires cueing 10 - 24% of the time Expression Expression Mode: Verbal Expression: 5-Expresses basic needs/ideas: With extra time/assistive device Social Interaction Social Interaction: 4-Interacts appropriately 75 - 89% of the time - Needs redirection for appropriate language or to initiate interaction. Problem Solving Problem Solving: 5-Solves basic 90% of the time/requires cueing < 10% of the time Memory Memory: 4-Recognizes or recalls 75 - 89% of the time/requires cueing 10 - 24% of the time FIM - Eating Eating Activity: 0: Activity did not occur  Pain Pain Assessment Pain Assessment: Yes Pain Score:    5 Pain Type: Acute pain Pain Location: Head Pain Descriptors: Headache Pain Frequency: Intermittent Patients Stated Pain Goal: 2 Pain Intervention(s): Medication (See eMAR)  Therapy/Group: Individual Therapy  Charlane Ferretti., CCC-SLP [phone]  Arvell Pulsifer 02/27/2013, 1:02 PM

## 2013-02-27 NOTE — Progress Notes (Signed)
Physical Therapy Session Note  Patient Details  Name: [name] MRN: [id number] Date of Birth: 10-03-1931  Today's Date: 02/27/2013 Time: 0830-0930 Time Calculation (min): 60 min  Short Term Goals: Week 1:  PT Short Term Goal 1 (Week 1): STGs=LTGs secondary to LOS  Skilled Therapeutic Interventions/Progress Updates:    Patient received sitting edge of bed finishing breakfast. After transferring to wheelchair, patient doffed socks and donned new socks and shoes without assistance. Today's session focused on gait training (including negotiation of obstacles and gait training in home environment), stair negotiation, rolling walker management, and overall activity tolerance.  Patient completed rolling walker obstacle course (x2), consisting of 3 cones to weave in/out of and 2 poles to step over. Patient requires min guard with intermittent instances of supervision. Patient demonstrates good RW management as she maintains BOS within RW during both trials. Additionally, patient demonstrates appropriate and safe technique for stepping over poles. Patient with one instance of ambulating with R foot outside of RW during a turn, but is able to self correct without LOB or cues.  Goals modified today secondary to clarification that patient's son works "out of the home" means he works "from home". Patient's son rarely leaves her alone at home, but when it is required, neighbors can provide supervision. Therefore, all LTGs modified to S level secondary to patient's cognition/memory deficits. Additionally, patient reports she only goes outside once per month, reinforcing that a community ambulation goal is not necessary.  Patient returned to room and left seated in wheelchair with all needs within reach.  Therapy Documentation Precautions:  Precautions Precautions: Fall;ICD/Pacemaker Precaution Comments: h/o recurrent falls at home Restrictions Weight Bearing Restrictions: No Pain: Pain  Assessment Pain Assessment: No/denies pain Pain Score: 0-No pain Locomotion : Ambulation Ambulation: Yes Ambulation/Gait Assistance: 4: Min guard Ambulation Distance (Feet): 75 Feet (see details below for additional distances) Assistive device: Rolling walker Ambulation/Gait Assistance Details: Tactile cues for posture;Verbal cues for precautions/safety;Verbal cues for gait pattern Ambulation/Gait Assistance Details: Patient instructed in gait training 75'x4, 40'x1, 45' x1 in controlled environment with min assist progressing to supervision. Patient requires verbal cues to maintain BOS within RW and maintain straight path as she tends to drift R during gait training. Patient requires verbal cues to decreased L LE scissoring/adduction. Patient instructed in gait training in home environment (on carpet and tile, negotiating change from carpet to tile, around furniture and obstacles in small spaces) with min guard progressing to supervision. Patient requires verbal cues for appropriate RW management when attempting turn/pivot on carpet, instructed not to lift RW off of floor. Gait Gait: Yes Gait Pattern: Step-through pattern;Shuffle;Trunk flexed;Narrow base of support;Scissoring;Decreased weight shift to left;Right flexed knee in stance;Left flexed knee in stance Stairs / Additional Locomotion Stairs: Yes Stairs Assistance: 4: Min assist Stairs Assistance Details: Verbal cues for sequencing;Verbal cues for technique;Verbal cues for precautions/safety;Manual facilitation for weight shifting Stair Management Technique: One rail Right;Step to pattern;Sideways Number of Stairs: 5 Height of Stairs: 6 Wheelchair Mobility Wheelchair Mobility: No   See FIM for current functional status  Therapy/Group: Individual Therapy  Chipper Herb. Nayely Dingus, PT, DPT  02/27/2013, 10:58 AM

## 2013-02-27 NOTE — Progress Notes (Signed)
Occuapational Therapy Note  Patient Details  Name: [name] MRN: [id number] Date of Birth: Dec 11, 1930 Today's Date: 02/27/2013  Time In:  1400 Time Out:  1450.  Individual session no complaints of pain.  Treatment focused on balance retraining, increasing activity tolerance, functional ambulation with RW, standing tolerance, weight shifting in standing, toilet transfers, toilet hygiene, wheelchair transfers, bed mobility.  Patient became nauseated at the end of the session. Returned to bed and RN notified.    Norton Pastel 02/27/2013, 4:36 PM

## 2013-02-28 ENCOUNTER — Inpatient Hospital Stay (HOSPITAL_COMMUNITY): Payer: Medicare Other | Admitting: Physical Therapy

## 2013-02-28 ENCOUNTER — Inpatient Hospital Stay (HOSPITAL_COMMUNITY): Payer: Medicare Other

## 2013-02-28 ENCOUNTER — Inpatient Hospital Stay (HOSPITAL_COMMUNITY): Payer: Medicare Other | Admitting: Speech Pathology

## 2013-02-28 DIAGNOSIS — G8110 Spastic hemiplegia affecting unspecified side: Secondary | ICD-10-CM

## 2013-02-28 DIAGNOSIS — I6330 Cerebral infarction due to thrombosis of unspecified cerebral artery: Secondary | ICD-10-CM

## 2013-02-28 MED ORDER — TEMAZEPAM 7.5 MG PO CAPS
7.5000 mg | ORAL_CAPSULE | Freq: Every evening | ORAL | Status: DC | PRN
  Administered 2013-02-28 – 2013-03-03 (×4): 7.5 mg via ORAL
  Filled 2013-02-28 (×4): qty 1

## 2013-02-28 NOTE — Progress Notes (Signed)
Speech Language Pathology Daily Session Note  Patient Details  Name: [name] MRN: [id number] Date of Birth: 24-Feb-1931  Today's Date: 02/28/2013 Time: 5784-6962 Time Calculation (min): 40 min  Short Term Goals: Week 1: SLP Short Term Goal 1 (Week 1): Patient will utilize external aids to assist with recall of daily information with supervision level verbal cues SLP Short Term Goal 2 (Week 1): Patient will utilize retrieval strategies with supervision level verbal cues. SLP Short Term Goal 3 (Week 1): Patient will select attention to basic familiar tasks with supervision level verbal cues for re-directions.  Skilled Therapeutic Interventions: Skilled treatment session focused on addressing cognitive-linguistic goals.  SLP facilitated session with mod assist verbal cues for re-direction due to mild environmental distractions.  SLP also facilitated session with education regarding use of notes as memory aid and max assist question cues to recall today's activities and take brief notes.  Patient reported needing new glasses and that's waht made taking notes difficult; patient continues to demonstrate poor awareenss of CVA deficits.     FIM:  Comprehension Comprehension Mode: Auditory Comprehension: 4-Understands basic 75 - 89% of the time/requires cueing 10 - 24% of the time Expression Expression Mode: Verbal Expression: 5-Expresses basic needs/ideas: With extra time/assistive device Social Interaction Social Interaction: 4-Interacts appropriately 75 - 89% of the time - Needs redirection for appropriate language or to initiate interaction. Problem Solving Problem Solving: 5-Solves basic 90% of the time/requires cueing < 10% of the time Memory Memory: 4-Recognizes or recalls 75 - 89% of the time/requires cueing 10 - 24% of the time FIM - Eating Eating Activity: 5: Set-up assist for open containers  Pain  Back pain, chronic and patient denied need for pain medication    Therapy/Group: Individual Therapy  Charlane Ferretti., CCC-SLP [phone]  [name] 02/28/2013, 2:55 PM

## 2013-02-28 NOTE — Progress Notes (Signed)
Occupational Therapy Session Note  Patient Details  Name: [name] MRN: [id number] Date of Birth: [birth year]  Today's Date: 02/28/2013 Time: 4098-1191 [phone]) Time Calculation (min): 60 min ( )  Short Term Goals: Week 1:  OT Short Term Goal 1 (Week 1): Pt will peform toilet transfers with close supervision using the RW and 3:1. OT Short Term Goal 2 (Week 1): Pt will peform LB bathing and dressing with supervision sit to stand for 2 consecutive sessions. OT Short Term Goal 3 (Week 1): Pt will perform walk-in shower transfer with supervision using RW to shower seat. OT Short Term Goal 4 (Week 1): Pt will gather all clothes/towels/washcloths prior to shower with no more than min questioning cues.  Skilled Therapeutic Interventions/Progress Updates:  Session 1:   ADL re-training completed at sink this AM. Session with focus on ADL performance, functional transfer, standing balance, functional mobility in bathroom with walker, toileting, and safety awareness. Needed verbal cues for safety during RW use during sit to stands.  Pt still needed min vc's to remain on task.  Session 2: Pt participated in Theraband exercises to increase Bil UE strength and endurance. See below for exercises. Pt given yellow theraband for use at home.   02/28/13 1000  General Exercises - Upper Extremity  Shoulder Extension Theraband;15 reps  Theraband Level (Shoulder Extension) Level 1 (Yellow)  Shoulder Horizontal ABduction Theraband;15 reps  Theraband Level (Shoulder Horizontal Abduction) Level 1 (Yellow)  Shoulder Horizontal ADduction Theraband;15 reps  Theraband Level (Shoulder Horizontal Adduction) Level 1 (Yellow)  Elbow Flexion Theraband;15 reps  Theraband Level (Elbow Flexion) Level 1 (Yellow)  Elbow Extension Theraband;15 reps  Theraband Level (Elbow Extension) Level 1 (Yellow)  Wrist Flexion Theraband;15 reps  Theraband Level (Wrist Flexion) Level 1 (Yellow)    Therapy  Documentation Precautions:  Precautions Precautions: Fall;ICD/Pacemaker Precaution Comments: h/o recurrent falls at home Restrictions Weight Bearing Restrictions: No Pain: Pain Assessment Pain Assessment: Faces Faces Pain Scale: No hurt  See FIM for current functional status  Therapy/Group: Individual Therapy  Limmie Patricia, OTR/L  02/28/2013, 9:40 AM

## 2013-02-28 NOTE — Progress Notes (Addendum)
Patient ID: [name], female   DOB: 20-Jul-1931, 79 y.o.   MRN: [id number] Patient ID: [name], female   DOB: 01-03-1931, 79 y.o.   MRN: [id number]  Subjective/Complaints: 79 y.o. right-handed female with history of PAF/tachybradycardia syndrome who had initially been on anti-coagulation with Coumadin years ago but due to unsteady gait and risk of falling Coumadin was discontinued. Admitted 02/20/2013 with slurred speech. Cranial CT scan was negative for acute abnormalities. Patient did receive TPA. MRI of the brain not done secondary to pacemaker. Echocardiogram with ejection fraction of 70% without embolism. Carotid Dopplers with no ICA stenosis. EKG did show atrial fibrillation. Neurology followup question recurrent stroke versus TIA. Patient presently maintained on Eliquis for atrial fibrillation as well as stroke prophylaxis. Urine study did show greater than 100,000 group B(S.Agalactiae) and placed on Septra Vomited last noc, nauseated Ear pain for 2 months on R has been seen by PCP for this.  Not sure if she has seen an ENT No fevers Still has nausea Poor sleep, poor appetite, food tastes funny PMH:  Hx Left RCT, Post op infection Review of Systems  Gastrointestinal: Positive for nausea, vomiting and abdominal pain. Negative for diarrhea.  Musculoskeletal: Positive for joint pain.  All other systems reviewed and are negative.    Objective: Vital Signs: Blood pressure 157/81, pulse 70, temperature 98.1 F (36.7 C), temperature source Oral, resp. rate 18, weight 67.9 kg (149 lb 11.1 oz), SpO2 98.00%. No results found. Results for orders placed during the hospital encounter of 02/25/13 (from the past 72 hour(s))  CBC WITH DIFFERENTIAL     Status: Abnormal   Collection Time    02/26/13  5:55 AM      Result Value Range   WBC 7.5  4.0 - 10.5 K/uL   RBC 2.99 (*) 3.87 - 5.11 MIL/uL   Hemoglobin 8.8 (*) 12.0 - 15.0 g/dL   HCT 78.2 (*) 95.6 - 21.3 %   MCV 87.6  78.0 - 100.0  fL   MCH 29.4  26.0 - 34.0 pg   MCHC 33.6  30.0 - 36.0 g/dL   RDW 08.6 (*) 57.8 - 46.9 %   Platelets 201  150 - 400 K/uL   Neutrophils Relative 62  43 - 77 %   Neutro Abs 4.6  1.7 - 7.7 K/uL   Lymphocytes Relative 21  12 - 46 %   Lymphs Abs 1.6  0.7 - 4.0 K/uL   Monocytes Relative 12  3 - 12 %   Monocytes Absolute 0.9  0.1 - 1.0 K/uL   Eosinophils Relative 4  0 - 5 %   Eosinophils Absolute 0.3  0.0 - 0.7 K/uL   Basophils Relative 0  0 - 1 %   Basophils Absolute 0.0  0.0 - 0.1 K/uL  COMPREHENSIVE METABOLIC PANEL     Status: Abnormal   Collection Time    02/26/13  5:55 AM      Result Value Range   Sodium 139  135 - 145 mEq/L   Potassium 3.4 (*) 3.5 - 5.1 mEq/L   Chloride 103  96 - 112 mEq/L   CO2 28  19 - 32 mEq/L   Glucose, Bld 107 (*) 70 - 99 mg/dL   BUN 10  6 - 23 mg/dL   Creatinine, Ser 6.29  0.50 - 1.10 mg/dL   Calcium 9.0  8.4 - 52.8 mg/dL   Total Protein 5.7 (*) 6.0 - 8.3 g/dL   Albumin 2.7 (*) 3.5 - 5.2  g/dL   AST 12  0 - 37 U/L   ALT 6  0 - 35 U/L   Alkaline Phosphatase 61  39 - 117 U/L   Total Bilirubin 0.2 (*) 0.3 - 1.2 mg/dL   GFR calc non Af Amer 50 (*) >90 mL/min   GFR calc Af Amer 58 (*) >90 mL/min   Comment:            The eGFR has been calculated     using the CKD EPI equation.     This calculation has not been     validated in all clinical     situations.     eGFR's persistently     <90 mL/min signify     possible Chronic Kidney Disease.     HEENT: normal and no facial droop or weakness Cardio: irregular and no murmurs Resp: CTA B/L and unlabored GI: BS positive and mild diffuse tenderness Extremity:  No Edema Skin:   Intact Neuro: Alert/Oriented, Cranial Nerve II-XII normal, Normal Sensory, Abnormal Motor 3-/5 Left deltoid,4/5 Left bi, tri, grip, 4/5 in RUE and BLE, Abnormal FMC Ataxic/ dec FMC and Tone  Within Normal Limits and Tone:  Within Normal Limits Musc/Skel:  Extremity tender Left shoulder pain with active and passive ROM Gen  NAD   Assessment/Plan: 1. 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 Physiatrist is providing close team supervision and 24 hour management of active medical problems listed below. Physiatrist and rehab team continue to assess barriers to discharge/monitor patient progress toward functional and medical goals. FIM: FIM - Bathing Bathing Steps Patient Completed: Chest;Right Arm;Left Arm;Abdomen;Front perineal area;Buttocks;Right upper leg;Left upper leg;Right lower leg (including foot);Left lower leg (including foot) Bathing: 4: Steadying assist  FIM - Upper Body Dressing/Undressing Upper body dressing/undressing steps patient completed: Thread/unthread right sleeve of pullover shirt/dresss;Thread/unthread left sleeve of pullover shirt/dress;Put head through opening of pull over shirt/dress;Pull shirt over trunk Upper body dressing/undressing: 5: Supervision: Safety issues/verbal cues FIM - Lower Body Dressing/Undressing Lower body dressing/undressing steps patient completed: Thread/unthread right underwear leg;Thread/unthread left underwear leg;Pull underwear up/down;Thread/unthread right pants leg;Thread/unthread left pants leg;Pull pants up/down;Fasten/unfasten pants;Don/Doff right sock;Don/Doff left sock;Don/Doff right shoe;Don/Doff left shoe Lower body dressing/undressing: 4: Min-Patient completed 75 plus % of tasks  FIM - Toileting Toileting steps completed by patient: Adjust clothing prior to toileting;Performs perineal hygiene;Adjust clothing after toileting Toileting Assistive Devices: Grab bar or rail for support Toileting: 5: Supervision: Safety issues/verbal cues  FIM - Diplomatic Services operational officer Devices: Grab bars;Walker Toilet Transfers: 4-To toilet/BSC: Min A (steadying Pt. > 75%);4-From toilet/BSC: Min A (steadying Pt. > 75%)  FIM  - Banker Devices: Walker;Arm rests Bed/Chair Transfer: 4: Chair or W/C > Bed: Min A (steadying Pt. > 75%);4: Bed > Chair or W/C: Min A (steadying Pt. > 75%)  FIM - Locomotion: Wheelchair Distance: 150 Locomotion: Wheelchair: 1: Total Assistance/staff pushes wheelchair (Pt<25%) FIM - Locomotion: Ambulation Locomotion: Ambulation Assistive Devices: Designer, industrial/product Ambulation/Gait Assistance: 4: Min guard Locomotion: Ambulation: 2: Travels 50 - 149 ft with minimal assistance (Pt.>75%)  Comprehension Comprehension Mode: Auditory Comprehension: 4-Understands basic 75 - 89% of the time/requires cueing 10 - 24% of the time  Expression Expression Mode: Verbal Expression: 5-Expresses basic needs/ideas: With extra time/assistive device  Social Interaction Social Interaction: 4-Interacts appropriately 75 - 89% of the time - Needs redirection for appropriate language or to initiate interaction.  Problem Solving Problem Solving: 5-Solves basic 90% of the time/requires cueing < 10% of the time  Memory Memory: 4-Recognizes or recalls 75 - 89% of the time/requires cueing 10 - 24% of the time   Medical Problem List and Plan:  1. 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 MRI not completed secondary to pacemaker  2. DVT Prophylaxis/Anticoagulation: SCDs. Monitor for any signs of DVT  3. Neuropsych: This patient is capable of making decisions on his/her own behalf.  4. Hypertension/PAF. Betapace 120 mg twice a day, eliquis 5 mg twice a day  5. Hyperlipidemia. Zocor/lovaza  6. Hypothyroidism. Synthroid  7. Recurrent UTI. Latest urinalysis study shows greater 100,000 group B(S.agalactiae). Continue Bactrim for now. Patient denies dysuria  8. History of gout. Allopurinol twice a day. Monitor for signs of flare up 9.  Anemia-check stool OB 10.  R ear pain  ,On Septra for urine which should cover otitis 11.  Multiple somatic complaints poor sleep and appetite suspect  depression although pt specifically denies,cont Cymbalta ask neuropsych to eval , restoril for sleep  LOS (Days) 3 A FACE TO FACE EVALUATION WAS PERFORMED  Kalia Vahey E 02/28/2013, 7:35 AM

## 2013-02-28 NOTE — Progress Notes (Signed)
Physical Therapy Session Note  Patient Details  Name: [name] MRN: [id number] Date of Birth: Apr 04, 1931  Today's Date: 02/28/2013 Time: 1400-1500 Time Calculation (min): 60 min  Short Term Goals: Week 1:  PT Short Term Goal 1 (Week 1): STGs=LTGs secondary to LOS  Skilled Therapeutic Interventions/Progress Updates:   Patient participated in gait group with focus on LE strengthening with 2 sets x 10 reps sit <> stand from w/c and mini squats with minimal UE support, simulated car transfer training with use of 4WW and min A, stair negotiation training up and down 4 steps x 2 reps with bilat rail forwards and then laterally with one rail and min-mod A as patient fatigued, higher level gait training with R and L lateral and retro stepping, and dynamic balance training with alternating LE hip flexion marches with bilat UE support on RW for single limb stance training.    Therapy Documentation Precautions:  Precautions Precautions: Fall;ICD/Pacemaker Precaution Comments: h/o recurrent falls at home Restrictions Weight Bearing Restrictions: No Vital Signs: Therapy Vitals Temp: 98.2 F (36.8 C) Temp src: Oral Pulse Rate: 80 Resp: 18 BP: 153/81 mmHg Patient Position, if appropriate: Lying Oxygen Therapy SpO2: 96 % O2 Device: None (Room air) Pain: Pain Assessment Pain Assessment: No/denies pain  See FIM for current functional status  Therapy/Group: Group Therapy  Edman Circle Faucette 02/28/2013, 5:21 PM

## 2013-03-01 ENCOUNTER — Inpatient Hospital Stay (HOSPITAL_COMMUNITY): Payer: Medicare Other | Admitting: Physical Therapy

## 2013-03-01 ENCOUNTER — Inpatient Hospital Stay (HOSPITAL_COMMUNITY): Payer: Medicare Other | Admitting: *Deleted

## 2013-03-01 ENCOUNTER — Inpatient Hospital Stay (HOSPITAL_COMMUNITY): Payer: Medicare Other

## 2013-03-01 ENCOUNTER — Inpatient Hospital Stay (HOSPITAL_COMMUNITY): Payer: Medicare Other | Admitting: Speech Pathology

## 2013-03-01 DIAGNOSIS — R5381 Other malaise: Secondary | ICD-10-CM

## 2013-03-01 DIAGNOSIS — G459 Transient cerebral ischemic attack, unspecified: Secondary | ICD-10-CM

## 2013-03-01 NOTE — Progress Notes (Signed)
Speech Language Pathology Daily Session Note  Patient Details  Name: [name] MRN: [id number] Date of Birth: 1931-03-11  Today's Date: 03/01/2013 Time: 1120-1150 Time Calculation (min): 30 min  Short Term Goals: Week 1: SLP Short Term Goal 1 (Week 1): Patient will utilize external aids to assist with recall of daily information with supervision level verbal cues SLP Short Term Goal 1 - Progress (Week 1): Progressing toward goal SLP Short Term Goal 2 (Week 1): Patient will utilize retrieval strategies with supervision level verbal cues. SLP Short Term Goal 2 - Progress (Week 1): Progressing toward goal SLP Short Term Goal 3 (Week 1): Patient will select attention to basic familiar tasks with supervision level verbal cues for re-directions. SLP Short Term Goal 3 - Progress (Week 1): Progressing toward goal  Skilled Therapeutic Interventions: Skilled treatment for cognitive goals. Patient did recognize environmental distractor and asked that environment be re-arranged to alleviate distraction.  She also independently recognized calendar had not been updated and immediately asked clinician to update.  With min verbal cues, she was able to attend to basic familiar tasks after environment made less distractable.  Overall, she appears to be doing well with goals set.     FIM:  Comprehension Comprehension Mode: Auditory Comprehension: 5-Understands basic 90% of the time/requires cueing < 10% of the time Expression Expression Mode: Verbal Expression: 6-Expresses complex ideas: With extra time/assistive device Social Interaction Social Interaction: 4-Interacts appropriately 75 - 89% of the time - Needs redirection for appropriate language or to initiate interaction. Problem Solving Problem Solving: 5-Solves complex 90% of the time/cues < 10% of the time Memory Memory: 4-Recognizes or recalls 75 - 89% of the time/requires cueing 10 - 24% of the time  Pain Pain Assessment Pain Score:    3 Pain Type: Chronic pain Pain Location: Sacrum Pain Orientation: Posterior Pain Descriptors: Burning Pain Intervention(s): Repositioned Multiple Pain Sites: No  Therapy/Group: Individual Therapy  Lenny Pastel 03/01/2013, 4:35 PM

## 2013-03-01 NOTE — Progress Notes (Signed)
Occupational Therapy Note  Patient Details  Name: [name] MRN: [id number] Date of Birth: 1931/11/18 Today's Date: 03/01/2013  Time: 8-8:45am ( .) Pt seen for 1:1 OT session focusing on ADL re-training, activity tolerance and transfers. Pt in bed upon arrival. Pt stated she was not in a good position to eat and did not have much of an appetite. No pain reported, although pt did state she had an uncomfortable spot on buttocks which nursing has been applying cream. Once pt sitting EOB, ate minimal breakfast before getting up. Pt indecisive about whether to get a shower or sink bath. Secondary to time constraints, therapist suggested a sink bath today. Pt used RW to bathroom with SBA and close S for toilet transfers. Pt sat in wheelchair at sink for most of bathing, standing 3x for < 1 minute. Verbal cues for posture and weight shifting when standing. Max A to apply cream to buttocks. Ended session with pt sitting in wheelchair finishing up grooming with call bell in place.    Rayland Hamed M Phenix Vandermeulen 03/01/2013, 1:18 PM

## 2013-03-01 NOTE — Progress Notes (Signed)
Physical Therapy Note  Patient Details  Name: [name] MRN: [id number] Date of Birth: 02-21-1931 Today's Date: 03/01/2013  5643-3295 (55 minutes) individual Pain: pt reports pain in buttocks/ nurse notified Focus of treatment: Therapeutic exercises focused on activity tolerance/ sit to stand/ standing balance; gait training/ endurance Treatment: Transfers stand/pivot RW SBA; Nustep Level 3 x 10 minutes ; Biodex - limits of stability/ weight shifts without UE assist with limited anterior /posterior weight shifts; Standing balance/tolerance using unilateral UE support performing Wii bowling- pt able to tolerate standing for 2 frames before requiring seated rest break ( "fatigue/ back tired"). Pt completed one bowling game in approximately 15 minutes.    1300-1355 (55 minutes) group Pain: no complaint of pain this PM Pt participated in PT group session focused on gait training /safety/endurance. Pt ambulates 80 feet X 2 with RW min assist with mod tactile cues for erect trunk ( trunk quickly fatigues with increased forward lean) and vcs to stay within AD ; Standing ball toss- pt required max assist to prevent posterior loss of balance.   Keita Demarco,JIM 03/01/2013, 9:09 AM

## 2013-03-01 NOTE — Progress Notes (Signed)
[name] is a 79 y.o. female 02-19-1931 [id number]  Subjective: No new complaints. No new problems. Slept well. Feeling OK.  Objective: Vital signs in last 24 hours: Temp:  [98.2 F (36.8 C)-99 F (37.2 C)] 99 F (37.2 C) (03/29 0600) Pulse Rate:  [61-81] 81 (03/29 0600) Resp:  [17-18] 17 (03/29 0600) BP: (153-187)/(71-99) 171/71 mmHg (03/29 0600) SpO2:  [96 %] 96 % (03/29 0600) Weight change:  Last BM Date: 02/27/13  Intake/Output from previous day: 03/28 0701 - 03/29 0700 In: 240 [P.O.:240] Out: -  Last cbgs: CBG (last 3)  No results found for this basename: GLUCAP,  in the last 72 hours   Physical Exam General: No apparent distress    HEENT: moist mucosa Lungs: Normal effort. Lungs clear to auscultation, no crackles or wheezes. Cardiovascular: Regular rate and rhythm, no edema Musculoskeletal:  No change from before Neurological: No new neurological deficits Wounds: N/A    Skin: clear Alert, cooperative   Lab Results: BMET    Component Value Date/Time   NA 139 02/26/2013 0555   K 3.4* 02/26/2013 0555   CL 103 02/26/2013 0555   CO2 28 02/26/2013 0555   GLUCOSE 107* 02/26/2013 0555   BUN 10 02/26/2013 0555   CREATININE 1.03 02/26/2013 0555   CALCIUM 9.0 02/26/2013 0555   GFRNONAA 50* 02/26/2013 0555   GFRAA 58* 02/26/2013 0555   CBC    Component Value Date/Time   WBC 7.5 02/26/2013 0555   RBC 2.99* 02/26/2013 0555   HGB 8.8* 02/26/2013 0555   HCT 26.2* 02/26/2013 0555   PLT 201 02/26/2013 0555   MCV 87.6 02/26/2013 0555   MCH 29.4 02/26/2013 0555   MCHC 33.6 02/26/2013 0555   RDW 18.9* 02/26/2013 0555   LYMPHSABS 1.6 02/26/2013 0555   MONOABS 0.9 02/26/2013 0555   EOSABS 0.3 02/26/2013 0555   BASOSABS 0.0 02/26/2013 0555    Studies/Results: No results found.  Medications: I have reviewed the patient's current medications.  Assessment/Plan:  1. 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 MRI not completed secondary to pacemaker  2. DVT Prophylaxis/Anticoagulation:  SCDs. Monitor for any signs of DVT  3. Neuropsych: This patient is capable of making decisions on his/her own behalf.  4. Hypertension/PAF. Betapace 120 mg twice a day, eliquis 5 mg twice a day - may need to adjust meds 5. Hyperlipidemia. Zocor/lovaza  6. Hypothyroidism. Synthroid - cont Rx 7. Recurrent UTI. Latest urinalysis study shows greater 100,000 group B(S.agalactiae). Continue Bactrim for now. Patient denies dysuria  8. History of gout. Allopurinol twice a day. Monitor for signs of flare up  9. Anemia-check stool OB  10. R ear pain ,On Septra for urine which should cover otitis  11. Multiple somatic complaints poor sleep and appetite suspect depression although pt specifically denies,cont Cymbalta ask neuropsych to eval , restoril for sleep  Continue with current prescription therapy as reflected on the Med list.     Length of stay, days: 4  Sonda Primes , MD 03/01/2013, 9:51 AM

## 2013-03-02 ENCOUNTER — Inpatient Hospital Stay (HOSPITAL_COMMUNITY): Payer: Medicare Other | Admitting: *Deleted

## 2013-03-02 DIAGNOSIS — I10 Essential (primary) hypertension: Secondary | ICD-10-CM

## 2013-03-02 MED ORDER — SPIRONOLACTONE 25 MG PO TABS
25.0000 mg | ORAL_TABLET | Freq: Every day | ORAL | Status: DC
  Administered 2013-03-02 – 2013-03-04 (×3): 25 mg via ORAL
  Filled 2013-03-02 (×4): qty 1

## 2013-03-02 NOTE — Progress Notes (Signed)
Physical Therapy Note  Patient Details  Name: [name] MRN: [id number] Date of Birth: December 31, 1930 Today's Date: 03/02/2013  1300 -1355 (55 minutes) group Pain: pt reports unrated neck pain/ premedicated Pt participated in PT group session focused on gait training /safety/endurance/standing balance. Pt ambulates 80 feet X 2 RW close SBA with increased forward trunk flexion with fatigue; standing balance- ring toss min assist for balance without UE support (trunk extension improves when not leaning on AD).    [name],JIM 03/02/2013, 1:53 PM

## 2013-03-02 NOTE — Progress Notes (Signed)
[name] is a 79 y.o. female Apr 16, 1931 [id number]  Subjective: C/o not being able to eat unseasoned and unsalted food here - very unhappy.... No new problems. Slept well.   Objective: Vital signs in last 24 hours: Temp:  [98.6 F (37 C)-98.7 F (37.1 C)] 98.7 F (37.1 C) (03/30 0504) Pulse Rate:  [62-106] 65 (03/30 0504) Resp:  [18] 18 (03/30 0504) BP: (168-185)/(57-80) 185/71 mmHg (03/30 0504) SpO2:  [96 %-97 %] 96 % (03/30 0504) Weight change:  Last BM Date: 03/02/13  Intake/Output from previous day: 03/29 0701 - 03/30 0700 In: 1080 [P.O.:1080] Out: -  Last cbgs: CBG (last 3)  No results found for this basename: GLUCAP,  in the last 72 hours   Physical Exam General: No apparent distress    HEENT: moist mucosa Lungs: Normal effort. Lungs clear to auscultation, no crackles or wheezes. Cardiovascular: Regular rate and rhythm, no edema Musculoskeletal:  No change from before Neurological: No new neurological deficits Wounds: N/A    Skin: clear Alert, cooperative   Lab Results: BMET    Component Value Date/Time   NA 139 02/26/2013 0555   K 3.4* 02/26/2013 0555   CL 103 02/26/2013 0555   CO2 28 02/26/2013 0555   GLUCOSE 107* 02/26/2013 0555   BUN 10 02/26/2013 0555   CREATININE 1.03 02/26/2013 0555   CALCIUM 9.0 02/26/2013 0555   GFRNONAA 50* 02/26/2013 0555   GFRAA 58* 02/26/2013 0555   CBC    Component Value Date/Time   WBC 7.5 02/26/2013 0555   RBC 2.99* 02/26/2013 0555   HGB 8.8* 02/26/2013 0555   HCT 26.2* 02/26/2013 0555   PLT 201 02/26/2013 0555   MCV 87.6 02/26/2013 0555   MCH 29.4 02/26/2013 0555   MCHC 33.6 02/26/2013 0555   RDW 18.9* 02/26/2013 0555   LYMPHSABS 1.6 02/26/2013 0555   MONOABS 0.9 02/26/2013 0555   EOSABS 0.3 02/26/2013 0555   BASOSABS 0.0 02/26/2013 0555    Studies/Results: No results found.  Medications: I have reviewed the patient's current medications.  Assessment/Plan:  1. 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 MRI not completed  secondary to pacemaker  2. DVT Prophylaxis/Anticoagulation: SCDs. Monitor for any signs of DVT  3. Neuropsych: This patient is capable of making decisions on his/her own behalf.  4. Hypertension/PAF. Betapace 120 mg twice a day, eliquis 5 mg twice a day - may need to adjust meds 5. Hyperlipidemia. Zocor/lovaza  6. Hypothyroidism. Synthroid - cont Rx 7. Recurrent UTI. Latest urinalysis study shows greater 100,000 group B(S.agalactiae). Continue Bactrim for now. Patient denies dysuria  8. History of gout. Allopurinol twice a day. Monitor for signs of flare up  9. Anemia-check stool OB  10. R ear pain ,On Septra for urine which should cover otitis  11. Multiple somatic complaints poor sleep and appetite suspect depression although pt specifically denies,cont Cymbalta ask neuropsych to eval , restoril for sleep 12. HTN. I'll change her diet to regular at her request. I added Spironolactone. She may need another BP med added. Labs next week  Continue with current prescription therapy as reflected on the Med list.     Length of stay, days: 5  Sonda Primes , MD 03/02/2013, 9:31 AM

## 2013-03-03 ENCOUNTER — Inpatient Hospital Stay (HOSPITAL_COMMUNITY): Payer: Medicare Other | Admitting: *Deleted

## 2013-03-03 ENCOUNTER — Inpatient Hospital Stay (HOSPITAL_COMMUNITY): Payer: Medicare Other | Admitting: Occupational Therapy

## 2013-03-03 ENCOUNTER — Inpatient Hospital Stay (HOSPITAL_COMMUNITY): Payer: Medicare Other | Admitting: Speech Pathology

## 2013-03-03 LAB — CBC
MCV: 87.2 fL (ref 78.0–100.0)
Platelets: 272 10*3/uL (ref 150–400)
RBC: 3.37 MIL/uL — ABNORMAL LOW (ref 3.87–5.11)
RDW: 19.5 % — ABNORMAL HIGH (ref 11.5–15.5)
WBC: 6.7 10*3/uL (ref 4.0–10.5)

## 2013-03-03 MED ORDER — LISINOPRIL 10 MG PO TABS
10.0000 mg | ORAL_TABLET | Freq: Every day | ORAL | Status: DC
  Administered 2013-03-03 – 2013-03-04 (×2): 10 mg via ORAL
  Filled 2013-03-03 (×3): qty 1

## 2013-03-03 NOTE — Progress Notes (Signed)
Patient ID: [name], female   DOB: Jan 06, 1931, 79 y.o.   MRN: [id number]  Subjective/Complaints: 79 y.o. right-handed female with history of PAF/tachybradycardia syndrome who had initially been on anti-coagulation with Coumadin years ago but due to unsteady gait and risk of falling Coumadin was discontinued. Admitted 02/20/2013 with slurred speech. Cranial CT scan was negative for acute abnormalities. Patient did receive TPA. MRI of the brain not done secondary to pacemaker. Echocardiogram with ejection fraction of 70% without embolism. Carotid Dopplers with no ICA stenosis. EKG did show atrial fibrillation. Neurology followup question recurrent stroke versus TIA. Patient presently maintained on Eliquis for atrial fibrillation as well as stroke prophylaxis. Urine study did show greater than 100,000 group B(S.Agalactiae) and placed on Septra  R ear but refused otic suspension (made her nauseated) PMH:  Hx Left RCT, Post op infection Review of Systems  Gastrointestinal: Positive for nausea, vomiting and abdominal pain. Negative for diarrhea.  Musculoskeletal: Positive for joint pain.  All other systems reviewed and are negative.    Objective: Vital Signs: Blood pressure 168/72, pulse 57, temperature 98 F (36.7 C), temperature source Oral, resp. rate 19, weight 67.9 kg (149 lb 11.1 oz), SpO2 98.00%. No results found. No results found for this or any previous visit (from the past 72 hour(s)).   HEENT: normal and no facial droop or weakness Cardio: irregular and no murmurs Resp: CTA B/L and unlabored GI: BS positive and mild diffuse tenderness Extremity:  No Edema Skin:   Intact Neuro: Alert/Oriented, Cranial Nerve II-XII normal, Normal Sensory, Abnormal Motor 3-/5 Left deltoid,4/5 Left bi, tri, grip, 4/5 in RUE and BLE, Abnormal FMC Ataxic/ dec FMC and Tone  Within Normal Limits and Tone:  Within Normal Limits Musc/Skel:  Extremity tender Left shoulder pain with active and passive  ROM Gen NAD   Assessment/Plan: 1. 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 Physiatrist is providing close team supervision and 24 hour management of active medical problems listed below. Physiatrist and rehab team continue to assess barriers to discharge/monitor patient progress toward functional and medical goals. Plan D/C in am but will need to check Hgb and BMET to ensure stability prior to D/C FIM: FIM - Bathing Bathing Steps Patient Completed: Chest;Right Arm;Left Arm;Abdomen;Front perineal area;Buttocks;Right upper leg;Left upper leg;Right lower leg (including foot);Left lower leg (including foot) Bathing: 5: Supervision: Safety issues/verbal cues  FIM - Upper Body Dressing/Undressing Upper body dressing/undressing steps patient completed: Thread/unthread right sleeve of pullover shirt/dresss;Thread/unthread left sleeve of pullover shirt/dress;Put head through opening of pull over shirt/dress;Pull shirt over trunk Upper body dressing/undressing: 5: Supervision: Safety issues/verbal cues FIM - Lower Body Dressing/Undressing Lower body dressing/undressing steps patient completed: Pull underwear up/down;Pull pants up/down;Thread/unthread right underwear leg;Thread/unthread left underwear leg;Thread/unthread right pants leg;Thread/unthread left pants leg Lower body dressing/undressing: 4: Min-Patient completed 75 plus % of tasks  FIM - Toileting Toileting steps completed by patient: Adjust clothing prior to toileting;Performs perineal hygiene;Adjust clothing after toileting Toileting Assistive Devices: Grab bar or rail for support Toileting: 5: Supervision: Safety issues/verbal cues  FIM - Diplomatic Services operational officer Devices: Grab bars;Walker Toilet Transfers: 4-To toilet/BSC: Min A (steadying Pt. > 75%);5-From toilet/BSC: Supervision  (verbal cues/safety issues)  FIM - Press photographer Assistive Devices: Walker;HOB elevated Bed/Chair Transfer: 5: Supine > Sit: Supervision (verbal cues/safety issues);4: Bed > Chair or W/C: Min A (steadying Pt. > 75%) (EOB <> RW)  FIM -  Locomotion: Wheelchair Distance: 150 Locomotion: Wheelchair: 1: Total Assistance/staff pushes wheelchair (Pt<25%) FIM - Locomotion: Ambulation Locomotion: Ambulation Assistive Devices: Designer, industrial/product Ambulation/Gait Assistance: 4: Min guard Locomotion: Ambulation: 2: Travels 50 - 149 ft with minimal assistance (Pt.>75%)  Comprehension Comprehension Mode: Auditory Comprehension: 4-Understands basic 75 - 89% of the time/requires cueing 10 - 24% of the time  Expression Expression Mode: Verbal Expression: 6-Expresses complex ideas: With extra time/assistive device  Social Interaction Social Interaction: 4-Interacts appropriately 75 - 89% of the time - Needs redirection for appropriate language or to initiate interaction.  Problem Solving Problem Solving: 5-Solves complex 90% of the time/cues < 10% of the time  Memory Memory: 4-Recognizes or recalls 75 - 89% of the time/requires cueing 10 - 24% of the time   Medical Problem List and Plan:  1. 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 MRI not completed secondary to pacemaker  2. DVT Prophylaxis/Anticoagulation: SCDs. Monitor for any signs of DVT  3. Neuropsych: This patient is capable of making decisions on his/her own behalf.  4. Hypertension/PAF. Betapace 120 mg twice a day, eliquis 5 mg twice a day, resume prinivil, monitor GFR 5. Hyperlipidemia. Zocor/lovaza  6. Hypothyroidism. Synthroid  7. Recurrent UTI. Latest urinalysis study shows greater 100,000 group B(S.agalactiae). Continue Bactrim for now. Patient denies dysuria  8. History of gout. Allopurinol twice a day. Monitor for signs of flare up 9.  Anemia-check stool OB 10.  R ear pain  ,On Septra for urine which should cover  otitis 11.  Multiple somatic complaints poor sleep and appetite suspect depression although pt specifically denies,cont Cymbalta ask neuropsych to eval , restoril for sleep  LOS (Days) 6 A FACE TO FACE EVALUATION WAS PERFORMED  KIRSTEINS,ANDREW E 03/03/2013, 7:56 AM

## 2013-03-03 NOTE — Progress Notes (Signed)
NUTRITION FOLLOW UP  DOCUMENTATION CODES  Per approved criteria   -Severe malnutrition in the context of chronic illness    Intervention:   1. Continue current interventions 2. RD to continue to follow nutrition care plan  Nutrition Dx:   Inadequate oral intake related to poor appetite as evidenced by pt report.  Improving.  Goal:   Intake to meet >90% of estimated nutrition needs.  Monitor:   weight trends, lab trends, I/O's, PO intake, supplement tolerance  Assessment:   Hx of PAF/tachybradycardia syndrome. Admitted 3/20 with slurred speech. Neurology followup question recurrent stroke versus TIA. Urine study did show greater than 100,000 group B(S.Agalactiae) and placed on Septra.  Per MD, pt with multiple somatic complaints and he suspects depression although pt specifically denies. Asked neuropsych to eval. MD liberalized diet to Regular yesterday 2/2 patient's complaints of unseasoned foods. Since diet liberalization intake has slightly improved, now completing mostly 50% of meals. Pt confirms this. Continues with orders for [employer] oral nutrition supplement. To d/c home Wednesday per chart.  Height: Ht Readings from Last 1 Encounters:  02/22/13 5\' 5"  (1.651 m)    Weight Status:   Wt Readings from Last 1 Encounters:  02/28/13 149 lb 11.1 oz (67.9 kg)  Wt trending up; was 140 lb at admission.  Re-estimated needs:  Kcal: 1500 - 1800 Protein: 70 - 80 grams Fluid: 1.8 - 2 liters daily  Skin: intact  Diet Order: General   Intake/Output Summary (Last 24 hours) at 03/03/13 1236 Last data filed at 03/03/13 0835  Gross per 24 hour  Intake    600 ml  Output      0 ml  Net    600 ml    Last BM: 3/31   Labs:   Recent Labs Lab 02/26/13 0555  NA 139  K 3.4*  CL 103  CO2 28  BUN 10  CREATININE 1.03  CALCIUM 9.0  GLUCOSE 107*    CBG (last 3)  No results found for this basename: GLUCAP,  in the last 72 hours  Scheduled Meds: . allopurinol  100  mg Oral BID  . apixaban  5 mg Oral BID  . calcitRIOL  0.25 mcg Oral q morning - 10a  . DULoxetine  60 mg Oral q morning - 10a  . feeding supplement  1 Container Oral BID BM  . levothyroxine  25 mcg Oral QAC breakfast  . lisinopril  10 mg Oral Daily  . multivitamin with minerals  1 tablet Oral q morning - 10a  . omega-3 acid ethyl esters  1 g Oral q morning - 10a  . pantoprazole  40 mg Oral BID  . simvastatin  40 mg Oral QPM  . sotalol  120 mg Oral BID  . spironolactone  25 mg Oral Daily  . sulfamethoxazole-trimethoprim  1 tablet Oral Q12H    Continuous Infusions:  none  Jarold Motto MS, RD, LDN Pager: [phone] After-hours pager: [phone]

## 2013-03-03 NOTE — Progress Notes (Signed)
Speech Language Pathology Daily Session Note & Discharge Summary  Patient Details  Name: [name] MRN: [id number] Date of Birth: 1930/12/28  Today's Date: 03/03/2013 Time: 7829-5621 Time Calculation (min): 28 min  Short Term Goals: Week 1: SLP Short Term Goal 1 (Week 1): Patient will utilize external aids to assist with recall of daily information with supervision level verbal cues SLP Short Term Goal 1 - Progress (Week 1): Progressing toward goal SLP Short Term Goal 2 (Week 1): Patient will utilize retrieval strategies with supervision level verbal cues. SLP Short Term Goal 2 - Progress (Week 1): Progressing toward goal SLP Short Term Goal 3 (Week 1): Patient will select attention to basic familiar tasks with supervision level verbal cues for re-directions. SLP Short Term Goal 3 - Progress (Week 1): Progressing toward goal  Skilled Therapeutic Interventions: Skilled treatment session focused on addressing cognitive-linguistic goals.  Patient's son present for session and education.  Patient was able to recall and verbalize recommended strategies for word and memory retrieval skills with hand out and supervision level verbal cues.  Patient's son present and reported that some cognitive deficits were baseline; however, exacerbated by this TIA/CVA.  He was receptive to cuing strategies and reported using association strategies PTA.  Goals met; no follow up services recommended at this time due to patient having level of assist needed at discharge.    FIM:  Comprehension Comprehension Mode: Auditory Comprehension: 5-Understands basic 90% of the time/requires cueing < 10% of the time Expression Expression Mode: Verbal Expression: 5-Expresses complex 90% of the time/cues < 10% of the time Social Interaction Social Interaction: 6-Interacts appropriately with others with medication or extra time (anti-anxiety, antidepressant). Problem Solving Problem Solving: 5-Solves basic 90% of the  time/requires cueing < 10% of the time Memory Memory: 4-Recognizes or recalls 75 - 89% of the time/requires cueing 10 - 24% of the time  Pain Pain Assessment Pain Assessment: Yes Pain Score:   8 Pain Type: Acute pain Pain Location: Head Pain Orientation: Posterior Pain Descriptors: Aching Pain Frequency: Intermittent Pain Onset: Gradual Pain Intervention(s): Medication (See eMAR); pre-medicated   Therapy/Group: Individual Therapy   Speech Language Pathology Discharge Summary  Patient Details  Name: [name] MRN: [id number] Date of Birth: 11-Nov-1931  Today's Date: 03/03/2013  Patient has met 3 of 3 long term goals.  Patient to discharge at overall Supervision level.  Reasons goals not met: n/a   Clinical Impression/Discharge Summary: Patient met 3 out of 3 long term goals during CIR stay as a result of increased problem solving, carryover of recall strategies as well as word retrieval strategies.  Patient's son reports that she is close to baseline function and he was able to verbalize understanding of strategies and cuing techniques.  As a result, of patient's progress from min-mod assist to supervision assist and son stating she is close to baseline no follow-up skilled SLP services are warranted at this time.    Care Partner:  Caregiver Able to Provide Assistance: Yes  Type of Caregiver Assistance: Physical;Cognitive  Recommendation:  24 hour supervision/assistance      Equipment: none   Reasons for discharge: Treatment goals met;Discharged from hospital   Patient/Family Agrees with Progress Made and Goals Achieved: Yes   See FIM for current functional status  Charlane Ferretti., CCC-SLP [phone]  Faye Sanfilippo 03/03/2013, 4:39 PM

## 2013-03-03 NOTE — Progress Notes (Signed)
Physical Therapy Discharge Summary  Patient Details  Name: [name] MRN: [id number] Date of Birth: 1931-10-15  Today's Date: 03/03/2013 Time: 1030-1130 Time Calculation (min): 60 min  Patient has met 12 of 12 long term goals due to improved activity tolerance, improved balance, improved postural control, ability to compensate for deficits, functional use of  left lower extremity, improved attention, improved awareness and improved coordination.  Patient to discharge at an ambulatory level Supervision.   Patient's care partner is independent to provide the necessary cognitive assistance at discharge.  Reasons goals not met: N/A, all LTGs met.  Recommendation:  Patient will benefit from ongoing skilled PT services in home health setting to continue to advance safe functional mobility, address ongoing impairments in activity tolerance, strength, balance, coordination, attention, awareness, postural control and overall functional mobility, and minimize fall risk.  Equipment: wheelchair with cushion  Reasons for discharge: treatment goals met and discharge from hospital  Patient/family agrees with progress made and goals achieved: Yes Patient's son present for session today and observed and participated in patient's ambulation, stair negotiation, curb negotiation, wheelchair mobility, and car transfer. Patient completed car transfer with rolling walker and supervision to sedan-like height. Patient and son educated about proper set-up, safety, sequencing, technique, and appropriate verbal cues to provide for all functional mobility. Patient's son return demonstrated all techniques properly and safely. Patient's son reports he had been doing most of this with patient prior to admission, so family ed information was not new to him.  PT Discharge Precautions/Restrictions Precautions Precautions: Fall;ICD/Pacemaker Precaution Comments: h/o recurrent falls at home Restrictions Weight  Bearing Restrictions: No Pain Pain Assessment Pain Assessment: No/denies pain Pain Score: 0-No pain Vision/Perception  Vision - History Baseline Vision: Wears glasses for distance only Visual History: Cataracts Patient Visual Report: No change from baseline Vision - Assessment Eye Alignment: Within Functional Limits Vision Assessment: Vision not tested  Cognition Arousal/Alertness: Awake/alert Orientation Level: Oriented X4 Safety/Judgment: Appears intact Sensation Sensation Light Touch: Appears Intact Proprioception: Appears Intact Additional Comments: Proprioception intact at B ankle and great toes Coordination Gross Motor Movements are Fluid and Coordinated: Yes Fine Motor Movements are Fluid and Coordinated: No (decreased speed and accuracy w/ rapid, alternating movements) Heel Shin Test: Slight dysmetria/overshooting with L LE Motor  Motor Motor: Abnormal postural alignment and control Motor - Skilled Clinical Observations: Mild hemiparesis on L LE, no clonus noted B LE  Mobility Bed Mobility Bed Mobility: Supine to Sit;Sit to Supine Supine to Sit: 6: Modified independent (Device/Increase time);HOB flat Sit to Supine: 6: Modified independent (Device/Increase time);HOB flat Transfers Sit to Stand: 5: Supervision;With armrests;With upper extremity assist;From bed;From chair/3-in-1 Sit to Stand Details: Verbal cues for precautions/safety Stand to Sit: 5: Supervision;To chair/3-in-1;With armrests;With upper extremity assist;To bed Stand to Sit Details (indicate cue type and reason): Verbal cues for precautions/safety Locomotion  Ambulation Ambulation: Yes Ambulation/Gait Assistance: 5: Supervision Ambulation Distance (Feet): 205 Feet Assistive device: Rolling walker Ambulation/Gait Assistance Details: Verbal cues for precautions/safety;Verbal cues for gait pattern Ambulation/Gait Assistance Details: Patient instructed in gait training 205' x1 in controlled environment  and 74' x1 in home environment. Gait Gait: Yes Gait Pattern: Step-through pattern;Trunk flexed;Narrow base of support;Scissoring Stairs / Additional Locomotion Stairs: Yes Stairs Assistance: 5: Supervision Stairs Assistance Details: Verbal cues for precautions/safety Stairs Assistance Details (indicate cue type and reason): Patient negotiates 10 stairs with B handrails and supervision with alternating pattern (to simulate going up/down stairs to second level at home). Patient negotiates 5 stairs with one handrail sideways (to simulate  going up/down stairs to enter home). Stair Management Technique: One rail Right;One rail Left;Two rails;Alternating pattern;Sideways;Forwards Number of Stairs: 10 Height of Stairs: 6 Curb: 5: Programme researcher, broadcasting/film/video: Yes Wheelchair Assistance: 5: Financial planner Details: Verbal cues for Engineer, drilling: Both upper extremities;Both lower extermities Wheelchair Parts Management: Needs assistance Distance: 150  Trunk/Postural Assessment  Cervical Assessment Cervical Assessment: Within Functional Limits Thoracic Assessment Thoracic Assessment: Within Functional Limits Lumbar Assessment Lumbar Assessment: Exceptions to [facility] Lumbar Strength Overall Lumbar Strength Comments: Pt demonstrates kyphotic posture with scoliosis deformity to the left. Postural Control Postural Control: Within Functional Limits  Balance Balance Balance Assessed: Yes Static Sitting Balance Static Sitting - Balance Support: No upper extremity supported;Feet supported Static Sitting - Level of Assistance: 5: Stand by assistance Dynamic Sitting Balance Dynamic Sitting - Balance Support: During functional activity;Feet supported;Feet unsupported;No upper extremity supported Dynamic Sitting - Level of Assistance: 6: Modified independent (Device/Increase time) Static Standing Balance Static Standing - Balance  Support: Bilateral upper extremity supported;During functional activity Static Standing - Level of Assistance: 5: Stand by assistance Extremity Assessment  RLE Assessment RLE Assessment: Within Functional Limits LLE Assessment LLE Assessment: Exceptions to [facility] LLE Strength LLE Overall Strength: Deficits LLE Overall Strength Comments: Grossly 4/5 strength  See FIM for current functional status  Willett Lefeber S Kabrina Christiano S. Rakin Lemelle, PT, DPT  03/03/2013, 4:29 PM

## 2013-03-03 NOTE — Discharge Summary (Signed)
  Discharge summary job [id number]

## 2013-03-03 NOTE — Progress Notes (Signed)
Social Work Patient ID: [name], female   DOB: September 06, 1931, 79 y.o.   MRN: [id number] Son here to go through family education and prepare for discharge tomorrow.  Pt does need a lightweight wheelchair not able to self propel a  Standard wheelchair and uses for self care also.  Son reports: " I provided this care prior to admission to hospital."  Pref [facility] since Had recently and liked them.

## 2013-03-03 NOTE — Progress Notes (Signed)
Occupational Therapy Session Note  Patient Details  Name: [name] MRN: [id number] Date of Birth: February 23, 1931  Today's Date: 03/03/2013 Time: 4098-1191 Time Calculation (min): 42 min  Skilled Therapeutic Interventions/Progress Updates:    Pt went down to the ADL apartment and worked on simple meal prep using the RW.  Pt needing min instructional cueing for positioning of the RW when standing at the counter to retrieve items.   She tends to push the walker to the side instead of keeping it in front of her.  Pt sat on a stool at the stove to rest while she was frying an egg.  She reports using a stool at home as well.  Pt needed assistance while turning the stove on secondary to her controls being on the front of the stove instead of the back.  Overall supervision level for task but needs continued evaluation in home setting.    Therapy Documentation Precautions:  Precautions Precautions: Fall;ICD/Pacemaker Precaution Comments: h/o recurrent falls at home Restrictions Weight Bearing Restrictions: No  See FIM for current functional status  Therapy/Group: Individual Therapy  Gabbi Whetstone OTR/L 03/03/2013, 3:54 PM

## 2013-03-03 NOTE — Progress Notes (Signed)
PRN tylenol given at 2200 for complaint of posterior neck pain. PRN restoril given per patient's request, for difficulty falling asleep. Requesting ear drops to be continued. Alfredo Martinez A

## 2013-03-03 NOTE — Progress Notes (Signed)
Occupational Therapy Discharge Summary  Patient Details  Name: [name] MRN: [id number] Date of Birth: 1931-06-08  Today's Date: 03/03/2013 Time: 4098-1191 Time Calculation (min): 42 min  Patient has met 10 of 10 long term goals due to improved activity tolerance, improved balance, postural control, ability to compensate for deficits, functional use of  LEFT upper and LEFT lower extremity, improved attention, improved awareness and improved coordination.  Patient to discharge at overall Supervision level.  Patient's care partner is her son which she lives with and he has cared for her for a long time.  Patient's son did not attend any OT sessions secondary he felt he did not need to attend and states he will continue to care for his mother as he has in the past.  Reasons goals not met: n/a secondary to all goals met  Recommendation:  Patient will benefit from ongoing skilled OT services in home health setting to continue to advance functional skills in the area of BADL, iADL and Reduce care partner burden.  Equipment: No equipment provided  Reasons for discharge: treatment goals met and discharge from hospital  Patient/family agrees with progress made and goals achieved: Yes  OT Discharge Precautions/Restrictions  Precautions Precautions: Fall;ICD/Pacemaker Precaution Comments: h/o recurrent falls at home Pain Denies pain ADL See FIM for current functional status Vision/Perception  Vision - History Baseline Vision: Wears glasses for distance only Visual History: Cataracts Patient Visual Report: No change from baseline Vision - Assessment Eye Alignment: Within Functional Limits Vision Assessment: Vision not tested  Cognition Overall Cognitive Status: Impaired Arousal/Alertness: Awake/alert Orientation Level: Oriented X4 Memory: Impaired Memory Impairment: Storage deficit;Retrieval deficit Awareness: Impaired Awareness Impairment: Anticipatory impairment Problem  Solving: Impaired Problem Solving Impairment: Functional basic Sensation Sensation Light Touch: Appears Intact Hot/Cold: Appears Intact Proprioception: Appears Intact Coordination Gross Motor Movements are Fluid and Coordinated: Yes Fine Motor Movements are Fluid and Coordinated: No (decreased speed and accuracy w/ rapid, alternating movements) Motor  Motor Motor: Abnormal postural alignment and control Motor - Discharge Observations: Mild Hemiparesis LLE Mobility  Overall Supervision for bed mobility and basic bathroom transfers Trunk/Postural Assessment  Cervical Assessment Cervical Assessment: Within Functional Limits (forward head posture) Thoracic Assessment Thoracic Assessment: Within Functional Limits (increased thoracic flexion) Lumbar Assessment Lumbar Assessment: Within Functional Limits Postural Control Postural Control: Deficits on evaluation Postural Limitations: Patient demonstrates R lateral trunk lean during standing and gait.  Balance Balance Balance Assessed: Yes Static Sitting Balance Static Sitting - Balance Support: No upper extremity supported;Feet supported Static Sitting - Level of Assistance: 5: Stand by assistance Dynamic Sitting Balance Dynamic Sitting - Balance Support: During functional activity;Feet supported;Feet unsupported;No upper extremity supported Dynamic Sitting - Level of Assistance: 6: Modified independent (Device/Increase time) Static Standing Balance Static Standing - Balance Support: Bilateral upper extremity supported;During functional activity Static Standing - Level of Assistance: 5: Stand by assistance Extremity/Trunk Assessment RUE Assessment RUE Assessment: Within Functional Limits LUE Assessment LUE Assessment: Exceptions to [facility] LUE Strength LUE Overall Strength Comments: Pt with history of left rotator cuff repair.  Demonstrates shoulder flexion 0-100 degrees and can perform AAROM to 140 degrees.  Elbow flexion and  extension AROM WFLS as well as digit flexion and extension.  Overall strength 4/5 throughout.  [name] 03/03/2013, 4:24 PM

## 2013-03-04 ENCOUNTER — Other Ambulatory Visit: Payer: Self-pay | Admitting: Physical Medicine & Rehabilitation

## 2013-03-04 DIAGNOSIS — I6330 Cerebral infarction due to thrombosis of unspecified cerebral artery: Secondary | ICD-10-CM

## 2013-03-04 LAB — BASIC METABOLIC PANEL
CO2: 28 mEq/L (ref 19–32)
Chloride: 100 mEq/L (ref 96–112)
Creatinine, Ser: 1.32 mg/dL — ABNORMAL HIGH (ref 0.50–1.10)
GFR calc Af Amer: 43 mL/min — ABNORMAL LOW (ref >90)
Potassium: 4.1 mEq/L (ref 3.5–5.1)
Sodium: 137 mEq/L (ref 135–145)

## 2013-03-04 MED ORDER — DULOXETINE HCL 60 MG PO CPEP: 60.0000 mg | ORAL_CAPSULE | Freq: Every morning | ORAL | Status: DC

## 2013-03-04 MED ORDER — SOTALOL HCL 120 MG PO TABS: 120.0000 mg | ORAL_TABLET | Freq: Two times a day (BID) | ORAL | Status: DC

## 2013-03-04 MED ORDER — PANTOPRAZOLE SODIUM 40 MG PO TBEC: 40.0000 mg | DELAYED_RELEASE_TABLET | Freq: Two times a day (BID) | ORAL | Status: DC

## 2013-03-04 MED ORDER — OMEGA-3-ACID ETHYL ESTERS 1 G PO CAPS: 1.0000 g | ORAL_CAPSULE | Freq: Every morning | ORAL | Status: DC

## 2013-03-04 MED ORDER — LISINOPRIL 10 MG PO TABS: 10.0000 mg | ORAL_TABLET | Freq: Every day | ORAL | Status: DC

## 2013-03-04 MED ORDER — ALLOPURINOL 100 MG PO TABS: 100.0000 mg | ORAL_TABLET | Freq: Two times a day (BID) | ORAL | Status: DC

## 2013-03-04 MED ORDER — APIXABAN 5 MG PO TABS: 5.0000 mg | ORAL_TABLET | Freq: Two times a day (BID) | ORAL | Status: DC

## 2013-03-04 MED ORDER — SULFAMETHOXAZOLE-TRIMETHOPRIM 400-80 MG PO TABS: 1.0000 | ORAL_TABLET | Freq: Two times a day (BID) | ORAL | Status: DC

## 2013-03-04 MED ORDER — SIMVASTATIN 40 MG PO TABS: 40.0000 mg | ORAL_TABLET | Freq: Every evening | ORAL | Status: DC

## 2013-03-04 MED ORDER — CALCITRIOL 0.25 MCG PO CAPS: 0.2500 ug | ORAL_CAPSULE | Freq: Every morning | ORAL | Status: DC

## 2013-03-04 MED ORDER — SPIRONOLACTONE 25 MG PO TABS: 25.0000 mg | ORAL_TABLET | Freq: Every day | ORAL | Status: DC

## 2013-03-04 MED ORDER — LEVOTHYROXINE SODIUM 25 MCG PO TABS: 25.0000 ug | ORAL_TABLET | Freq: Every morning | ORAL | Status: AC

## 2013-03-04 NOTE — Progress Notes (Signed)
Patient ID: [name], female   DOB: 02/13/1931, 79 y.o.   MRN: [id number]  Subjective/Complaints: 79 y.o. right-handed female with history of PAF/tachybradycardia syndrome who had initially been on anti-coagulation with Coumadin years ago but due to unsteady gait and risk of falling Coumadin was discontinued. Admitted 02/20/2013 with slurred speech. Cranial CT scan was negative for acute abnormalities. Patient did receive TPA. MRI of the brain not done secondary to pacemaker. Echocardiogram with ejection fraction of 70% without embolism. Carotid Dopplers with no ICA stenosis. EKG did show atrial fibrillation. Neurology followup question recurrent stroke versus TIA. Patient presently maintained on Eliquis for atrial fibrillation as well as stroke prophylaxis. Urine study did show greater than 100,000 group B(S.Agalactiae) and placed on Septra  Hard stools Feel like I have dried blood in nose  PMH:  Hx Left RCT, Post op infection Review of Systems  Gastrointestinal: Positive for nausea, vomiting and abdominal pain. Negative for diarrhea.  Musculoskeletal: Positive for joint pain.  All other systems reviewed and are negative.    Objective: Vital Signs: Blood pressure 156/61, pulse 53, temperature 98.5 F (36.9 C), temperature source Oral, resp. rate 18, weight 67.9 kg (149 lb 11.1 oz), SpO2 98.00%. No results found. Results for orders placed during the hospital encounter of 02/25/13 (from the past 72 hour(s))  CBC     Status: Abnormal   Collection Time    03/03/13  7:53 AM      Result Value Range   WBC 6.7  4.0 - 10.5 K/uL   RBC 3.37 (*) 3.87 - 5.11 MIL/uL   Hemoglobin 9.5 (*) 12.0 - 15.0 g/dL   HCT 13.0 (*) 86.5 - 78.4 %   MCV 87.2  78.0 - 100.0 fL   MCH 28.2  26.0 - 34.0 pg   MCHC 32.3  30.0 - 36.0 g/dL   RDW 69.6 (*) 29.5 - 28.4 %   Platelets 272  150 - 400 K/uL     HEENT: normal and no facial droop or weakness, no blood around nares Cardio: irregular and no murmurs Resp:  CTA B/L and unlabored GI: BS positive and mild diffuse tenderness Extremity:  No Edema Skin:   Intact Neuro: Alert/Oriented, Cranial Nerve II-XII normal, Normal Sensory, Abnormal Motor 3-/5 Left deltoid,4/5 Left bi, tri, grip, 4/5 in RUE and BLE, Abnormal FMC Ataxic/ dec FMC and Tone  Within Normal Limits and Tone:  Within Normal Limits Musc/Skel:  Extremity tender Left shoulder pain with active and passive ROM Gen NAD   Assessment/Plan: 1. 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  Stable for D/C today F/u PCP in 1-2 weeks F/u PM&R 3 weeks See D/C summary See D/C instructions  FIM: FIM - Bathing Bathing Steps Patient Completed: Chest;Right Arm;Left Arm;Abdomen;Front perineal area;Buttocks;Right upper leg;Left upper leg;Right lower leg (including foot);Left lower leg (including foot) Bathing: 5: Supervision: Safety issues/verbal cues  FIM - Upper Body Dressing/Undressing Upper body dressing/undressing steps patient completed: Thread/unthread right sleeve of pullover shirt/dresss;Thread/unthread left sleeve of pullover shirt/dress;Put head through opening of pull over shirt/dress;Pull shirt over trunk Upper body dressing/undressing: 5: Supervision: Safety issues/verbal cues FIM - Lower Body Dressing/Undressing Lower body dressing/undressing steps patient completed: Thread/unthread right underwear leg;Thread/unthread left underwear leg;Pull underwear up/down;Thread/unthread right pants leg;Thread/unthread left pants leg;Pull pants up/down;Don/Doff right sock;Don/Doff left sock;Don/Doff right shoe;Don/Doff left shoe;Fasten/unfasten right shoe;Fasten/unfasten left shoe Lower body dressing/undressing: 5: Set-up assist to: Obtain clothing  FIM - Toileting Toileting steps completed by patient: Adjust clothing prior to toileting;Performs perineal hygiene;Adjust clothing  after toileting Toileting Assistive Devices: Grab bar or rail for support Toileting: 5:  Supervision: Safety issues/verbal cues  FIM - Diplomatic Services operational officer Devices: Grab bars;Walker Toilet Transfers: 5-From toilet/BSC: Supervision (verbal cues/safety issues);5-To toilet/BSC: Supervision (verbal cues/safety issues)  FIM - Banker Devices: Walker;Arm rests Bed/Chair Transfer: 6: Supine > Sit: No assist;6: Sit > Supine: No assist;5: Chair or W/C > Bed: Supervision (verbal cues/safety issues);5: Bed > Chair or W/C: Supervision (verbal cues/safety issues)  FIM - Locomotion: Wheelchair Distance: 150 Locomotion: Wheelchair: 5: Travels 150 ft or more: maneuvers on rugs and over door sills with supervision, cueing or coaxing FIM - Locomotion: Ambulation Locomotion: Ambulation Assistive Devices: Designer, industrial/product Ambulation/Gait Assistance: 5: Supervision Locomotion: Ambulation: 5: Travels 150 ft or more with supervision/safety issues  Comprehension Comprehension Mode: Auditory Comprehension: 5-Understands basic 90% of the time/requires cueing < 10% of the time  Expression Expression Mode: Verbal Expression: 5-Expresses complex 90% of the time/cues < 10% of the time  Social Interaction Social Interaction: 6-Interacts appropriately with others with medication or extra time (anti-anxiety, antidepressant).  Problem Solving Problem Solving: 5-Solves basic 90% of the time/requires cueing < 10% of the time  Memory Memory: 4-Recognizes or recalls 75 - 89% of the time/requires cueing 10 - 24% of the time   Medical Problem List and Plan:  1. 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 MRI not completed secondary to pacemaker  2. DVT Prophylaxis/Anticoagulation: SCDs. Monitor for any signs of DVT  3. Neuropsych: This patient is capable of making decisions on his/her own behalf.  4. Hypertension/PAF. Betapace 120 mg twice a day, eliquis 5 mg twice a day, resume prinivil, monitor GFR 5. Hyperlipidemia. Zocor/lovaza  6. Hypothyroidism.  Synthroid  7. Recurrent UTI. Latest urinalysis study shows greater 100,000 group B(S.agalactiae). Continue Bactrim for 7 day course 8. History of gout. Allopurinol twice a day. Monitor for signs of flare up 9.  Anemia-no stool for OB recorded but Hgb stable at 9.5 f/u with PCP 10.  R ear pain  ,On Septra for urine which should cover otitis f/u PCP 11.  Multiple somatic complaints poor sleep and appetite suspect depression although pt specifically denies,cont Cymbalta ask neuropsych to eval , restoril for sleep  LOS (Days) 7 A FACE TO FACE EVALUATION WAS PERFORMED  KIRSTEINS,ANDREW E 03/04/2013, 7:34 AM

## 2013-03-04 NOTE — Discharge Summary (Signed)
[name], [name]             ACCOUNT NO.:  [id number]  MEDICAL RECORD NO.:  [id number]  LOCATION:  4011                         FACILITY:  [facility]  PHYSICIAN:  Mariam Dollar, P.A.  DATE OF BIRTH:  [birth year]  DATE OF ADMISSION:  02/25/2013 DATE OF DISCHARGE:  03/04/2013                              DISCHARGE SUMMARY   DISCHARGE DIAGNOSES:  Embolic left brain cerebrovascular accident, sequential compression devices for DVT prophylaxis, hypertension, hyperlipidemia, PAF,  hypothyroidism, recurrent urinary tract infection, history of gout, anemia, right ear pain.  HISTORY OF PRESENT ILLNESS:  This is a 79 year old right-handed female with history of PAF, tachy-brady syndrome who had initially been on anticoagulation with Coumadin years ago but due to unsteady gait and fall risk, her Coumadin was discontinued.  Admitted on February 20, 2013, with slurred speech.  Cranial CT scan negative.  The patient did receive tPA.  MRI of the brain not done secondary to pacemaker.  Echocardiogram with ejection fraction of 70% without embolism.  Carotid Dopplers with no ICA stenosis.  EKG with atrial fibrillation.  Neurology followup, question recurrent stroke versus TIA.  Maintained on Eliquis for atrial fibrillation as well as stroke prophylaxis.  Urine study did show greater than 100,000 group B agalactiae urinary tract infection, placed on Septra.  Physical and occupational therapy ongoing.  The patient was admitted for comprehensive rehab program.  PAST MEDICAL HISTORY:  See discharge diagnoses.  SOCIAL HISTORY:  Lives with son.  FUNCTIONAL HISTORY PRIOR TO ADMISSION:  Independent, does not drive, retired.  Functional history prior to admission,  min assist, ambulate 12 feet with a rolling walker.  PHYSICAL EXAMINATION:  VITAL SIGNS:  Blood pressure 149/55, pulse 66, temperature 98, respirations 18. GENERAL:  This was an alert female, oriented x3.  Speech is mildly slurred but fully  intelligible.  She followed basic commands.  She had reasonable insight and awareness. HEENT:  Pupils reactive to light. CARDIAC:  Rate controlled. ABDOMEN:  Soft, nontender.  Good bowel sounds. LUNGS:  Clear to auscultation.  REHABILITATION HOSPITAL COURSE:  The patient was admitted to inpatient Rehab services with therapies initiated on a 3-hour daily basis consisting of physical therapy, occupational therapy, speech therapy, and rehabilitation nursing.  The following issues were addressed during the patient's rehabilitation stay.  Pertaining to Ms. Corralejo suspect embolic left brain CVA, remained stable, maintained on Eliquis.  She would follow up Neurology Services.  Sequential compression devices for DVT prophylaxis.  Blood pressures monitored on lisinopril as well as Betapace with low-dose Aldactone.  Her cardiac rate remained controlled and she would follow up with Cardiology Services.  She remained on hormone supplement for hypothyroidism.  Noted right ear pain, she had some mild redness of the canal, remained afebrile.  She had been placed on Septra for her recent urine tract infection that should cover any otitis.  She would follow up with her primary MD versus outpatient ENT consult.  She had anemia, question chronic in nature, varying from a hemoglobin 3 months ago of 8.1-9.5.  Stool guaiac, GYC, were pending. She had no nausea or vomiting noted.  The patient received weekly collaborative interdisciplinary team conferences to discuss estimated length of stay, family teaching, and any  barriers to discharge.  She was continent of bowel and bladder, ambulating 80 feet x2, rolling walker, close standby assistance.  She was able to feed herself without any problems.  She was able to use a rolling walker to ambulate to the bathroom.  Close supervision for toilet transfers.  Overall her strength and endurance greatly improved.  Family teaching completed and plan was to be  discharged to home, March 04, 2013.  DISCHARGE MEDICATIONS:  Allopurinol 100 mg p.o. b.i.d., Eliquis 5 mg b.i.d., Rocaltrol 0.25 mcg every day, Cymbalta 60 mg p.o. daily, Synthroid 25 mcg daily, lisinopril 10 mg p.o. daily, multivitamin daily, Lovaza 1 g daily, Protonix 40 mg b.i.d., Zocor 40 mg daily, Betapace 120 mg p.o. b.i.d., Aldactone 25 mg p.o. daily.  DIET:  Regular.  SPECIAL INSTRUCTIONS:  The patient should follow up with Dr. Claudette Laws at the outpatient rehab center on March 27, 2013; Dr. Delia Heady, Neurology Service in 1 month, call for appointment; Dr. Armanda Magic, Cardiology Services 2 weeks, call for appointment; Dr. Johny Blamer, medical management.  Ongoing therapies were dictated as per [employer].     Mariam Dollar, P.A.     DA/MEDQ  D:  03/03/2013  T:  03/04/2013  Job:  [id number]  cc:   Erick Colace, M.D. Pramod P. Pearlean Brownie, MD Armanda Magic, M.D. Melida Quitter, M.D.

## 2013-03-04 NOTE — Progress Notes (Signed)
Patient discharged to home today with son in attendance. Discharge instruction given by PA-C, verbalized understanding, all questions addressed.  Care plan and patient education resolved. Discharged complete.

## 2013-03-04 NOTE — Progress Notes (Signed)
Social Work Discharge Note Discharge Note  The overall goal for the admission was met for:   Discharge location: Yes-HOME WITH SON WHO CAN PROVIDE SUPERVISION LEVEL  Length of Stay: Yes-7 DAYS  Discharge activity level: Yes-SUPERVISION LEVEL  Home/community participation: Yes  Services provided included: MD, RD, PT, OT, SLP, RN, Pharmacy and SW  Financial Services: Medicare and Private Insurance: BCBS  Follow-up services arranged: Home Health: [facility], DME: [facility] and Patient/Family request agency HH: [facility], DME: [facility]  Comments (or additional information):SON HERE FOR FAMILY EDUCATION YESTERDAY, SET FOR DISCHARGE  Patient/Family verbalized understanding of follow-up arrangements: Yes  Individual responsible for coordination of the follow-up plan: [name]-SON  Confirmed correct DME delivered: Lucy Chris 03/04/2013    Lucy Chris

## 2013-03-27 ENCOUNTER — Inpatient Hospital Stay: Payer: Medicare Other | Admitting: Physical Medicine & Rehabilitation

## 2013-04-10 ENCOUNTER — Other Ambulatory Visit: Payer: Self-pay | Admitting: Family Medicine

## 2013-04-10 ENCOUNTER — Ambulatory Visit
Admission: RE | Admit: 2013-04-10 | Discharge: 2013-04-10 | Disposition: A | Discharge status: Home / Self Care | Payer: Medicare Other | Source: Ambulatory Visit | Attending: Family Medicine | Admitting: Family Medicine

## 2013-04-10 DIAGNOSIS — R05 Cough: Secondary | ICD-10-CM

## 2013-04-11 ENCOUNTER — Inpatient Hospital Stay: Payer: Medicare Other | Admitting: Physical Medicine & Rehabilitation

## 2013-05-02 ENCOUNTER — Inpatient Hospital Stay: Payer: Medicare Other | Admitting: Physical Medicine & Rehabilitation

## 2013-05-06 ENCOUNTER — Other Ambulatory Visit: Payer: Self-pay | Admitting: Physical Medicine & Rehabilitation

## 2013-05-07 NOTE — Telephone Encounter (Signed)
Dr [facility] or PCP Dr Tiburcio Pea to refill

## 2013-05-07 NOTE — Telephone Encounter (Signed)
Eliquis refill request.  Please advise.

## 2013-05-16 ENCOUNTER — Inpatient Hospital Stay: Payer: Medicare Other | Admitting: Physical Medicine & Rehabilitation

## 2013-06-25 ENCOUNTER — Other Ambulatory Visit: Payer: Self-pay | Admitting: Otolaryngology

## 2013-09-02 ENCOUNTER — Encounter: Payer: Self-pay | Admitting: Internal Medicine

## 2013-09-02 ENCOUNTER — Ambulatory Visit (INDEPENDENT_AMBULATORY_CARE_PROVIDER_SITE_OTHER): Payer: Medicare Other | Admitting: Internal Medicine

## 2013-09-02 ENCOUNTER — Encounter: Payer: Self-pay | Admitting: *Deleted

## 2013-09-02 VITALS — BP 170/76 | HR 73 | Ht 62.0 in | Wt 142.0 lb

## 2013-09-02 DIAGNOSIS — I10 Essential (primary) hypertension: Secondary | ICD-10-CM

## 2013-09-02 DIAGNOSIS — Z45018 Encounter for adjustment and management of other part of cardiac pacemaker: Secondary | ICD-10-CM

## 2013-09-02 DIAGNOSIS — I4891 Unspecified atrial fibrillation: Secondary | ICD-10-CM

## 2013-09-02 DIAGNOSIS — Z950 Presence of cardiac pacemaker: Secondary | ICD-10-CM

## 2013-09-02 MED ORDER — CARVEDILOL 3.125 MG PO TABS: 3.1250 mg | ORAL_TABLET | Freq: Two times a day (BID) | ORAL | Status: DC

## 2013-09-02 NOTE — Assessment & Plan Note (Signed)
Her St. Jude dual-chamber pacemaker has reached elective replacement. We will schedule pacemaker removal and insertion of a new device in the next several weeks.

## 2013-09-02 NOTE — Patient Instructions (Addendum)
Pacemaker Generator Change Out   See instruction Sheet

## 2013-09-02 NOTE — Assessment & Plan Note (Signed)
Her blood pressure is not well controlled. I've asked the patient to start taking carvedilol 3.125 mg twice daily in addition to her other medications.

## 2013-09-02 NOTE — Progress Notes (Signed)
HPI Mrs. Epperly is referred today for ongoing evaluation and management of her permanent pacemaker. She is a very pleasant 79 year old woman with complete heart block, paroxysmal atrial fibrillation, and is status post permanent pacemaker insertion. She has a history of hypertension. Her blood pressures have recently been elevated. She has reached elective replacement on her pacemaker and has reverted to single chamber pacing mode to conserve her battery. The patient has class II shortness of breath. She has a history of peripheral edema though it is been well-controlled. She denies syncope. Her atrial fibrillation has been treated with sotalol therapy. She has not had syncope. Her main complaint today is that her blood pressure has been elevated. With this she feels shortness of breath. No Known Allergies   Current Outpatient Prescriptions  Medication Sig Dispense Refill  . acetaminophen (TYLENOL) 325 MG tablet Take 325 mg by mouth every 6 (six) hours as needed for pain.      Marland Kitchen allopurinol (ZYLOPRIM) 100 MG tablet Take 1 tablet (100 mg total) by mouth 2 (two) times daily.  60 tablet  1  . apixaban (ELIQUIS) 5 MG TABS tablet Take 1 tablet (5 mg total) by mouth 2 (two) times daily.  60 tablet  1  . calcitRIOL (ROCALTROL) 0.25 MCG capsule Take 1 capsule (0.25 mcg total) by mouth every morning.  30 capsule  1  . CRANBERRY PO Take 2 capsules by mouth 2 (two) times daily.      Marland Kitchen dextromethorphan-guaiFENesin (MUCINEX DM) 30-600 MG per 12 hr tablet Take 1 tablet by mouth every 12 (twelve) hours.      . diazepam (VALIUM) 10 MG tablet Take 10 mg by mouth daily.      . DULoxetine (CYMBALTA) 60 MG capsule Take 1 capsule (60 mg total) by mouth every morning.  30 capsule  1  . ferrous gluconate (FERGON) 325 MG tablet Take 325 mg by mouth daily with breakfast.      . ipratropium (ATROVENT) 0.06 % nasal spray Place 2 sprays into the nose every morning.      Marland Kitchen levothyroxine (SYNTHROID, LEVOTHROID) 25 MCG tablet  Take 1 tablet (25 mcg total) by mouth every morning.  30 tablet  1  . lisinopril (PRINIVIL,ZESTRIL) 10 MG tablet Take 1 tablet (10 mg total) by mouth daily.  30 tablet  1  . Multiple Vitamin (MULTIVITAMIN WITH MINERALS) TABS Take 1 tablet by mouth every morning.       Marland Kitchen omega-3 acid ethyl esters (LOVAZA) 1 G capsule Take 1 capsule (1 g total) by mouth every morning.  30 capsule  1  . pantoprazole (PROTONIX) 40 MG tablet Take 1 tablet (40 mg total) by mouth 2 (two) times daily.  60 tablet  1  . predniSONE (DELTASONE) 10 MG tablet 10 mg daily.      . simvastatin (ZOCOR) 40 MG tablet Take 1 tablet (40 mg total) by mouth every evening.  30 tablet  1  . sotalol (BETAPACE) 120 MG tablet Take 1 tablet (120 mg total) by mouth 2 (two) times daily.  60 tablet  1  . tiZANidine (ZANAFLEX) 2 MG tablet 2 mg daily.      . carvedilol (COREG) 3.125 MG tablet Take 1 tablet (3.125 mg total) by mouth 2 (two) times daily with a meal.  60 tablet  11   No current facility-administered medications for this visit.     Past Medical History  Diagnosis Date  . Hypertension   . Hyperlipidemia   . GERD (gastroesophageal reflux  disease)   . Dysphagia     appt with Molly Maduro Buccini upcoming  . Chronic recurrent sinusitis   . Recurrent UTI   . Atrial fibrillation     ???  . Arthritis   . TIA (transient ischemic attack)   . Anemia   . Hypothyroidism     ROS:   All systems reviewed and negative except as noted in the HPI.   Past Surgical History  Procedure Laterality Date  . Pacemaker insertion    . Back surgery  1985  . Shoulder surgery  2008    Left  (Dr. Ranell Patrick)  . Total knee arthroplasty  2000, 2001    bilateral (Dr. Despina Hick), left 2000, right 2001  . Cataract extraction  1986, 2003    left 1986, right 2003  . Vaginal hysterectomy  1969  . Bilateral salpingoophorectomy  1971  . Tonsillectomy  1951  . Appendectomy  1952  . Mastoidectomy  1970  . Sinus exploration  1972  . Knee arthroscopy  1988,  1995, 2000  . Hemorrhoid surgery  2009  . Repair / reconstruction interphalangeal joint  2004    R thumb  . Cystoscopy  1992  . Insert / replace / remove pacemaker       History reviewed. No pertinent family history.   History   Social History  . Marital Status: Divorced    Spouse Name: N/A    Number of Children: 5  . Years of Education: N/A   Occupational History  . Print production planner     retired   Social History Main Topics  . Smoking status: Former Smoker -- 1.00 packs/day for 15 years    Types: Cigarettes    Quit date: 12/04/1981  . Smokeless tobacco: Not on file  . Alcohol Use: No  . Drug Use: No  . Sexual Activity: No   Other Topics Concern  . Not on file   Social History Narrative  . No narrative on file     BP 170/76  Pulse 73  Ht 5\' 2"  (1.575 m)  Wt 142 lb (64.411 kg)  BMI 25.97 kg/m2  Physical Exam:  Well appearing 79 year old woman,NAD HEENT: Unremarkable Neck:  No JVD, no thyromegally Back:  No CVA tenderness Lungs:  Clear with no wheezes, rales, or rhonchi. Well-healed  Right-sided pacemaker implant HEART:  Regular rate rhythm, no murmurs, no rubs, no clicks Abd:  soft, positive bowel sounds, no organomegally, no rebound, no guarding Ext:  2 plus pulses, no edema, no cyanosis, no clubbing Skin:  No rashes no nodules Neuro:  CN II through XII intact, motor grossly intact   DEVICE  Normal device function.  See PaceArt for details.  Device at elective replacement  Assess/Plan:

## 2013-09-03 LAB — CBC WITH DIFFERENTIAL/PLATELET
Basophils Relative: 0.6 % (ref 0.0–3.0)
Eosinophils Absolute: 0 10*3/uL (ref 0.0–0.7)
HCT: 30.9 % — ABNORMAL LOW (ref 36.0–46.0)
Hemoglobin: 10 g/dL — ABNORMAL LOW (ref 12.0–15.0)
Lymphocytes Relative: 19.9 % (ref 12.0–46.0)
Lymphs Abs: 1.6 10*3/uL (ref 0.7–4.0)
MCHC: 32.5 g/dL (ref 30.0–36.0)
Monocytes Relative: 3.2 % (ref 3.0–12.0)
Neutro Abs: 6.2 10*3/uL (ref 1.4–7.7)
RBC: 3.1 Mil/uL — ABNORMAL LOW (ref 3.87–5.11)

## 2013-09-03 LAB — BASIC METABOLIC PANEL
CO2: 31 mEq/L (ref 19–32)
Calcium: 9.6 mg/dL (ref 8.4–10.5)
Glucose, Bld: 131 mg/dL — ABNORMAL HIGH (ref 70–99)
Potassium: 4.1 mEq/L (ref 3.5–5.1)
Sodium: 140 mEq/L (ref 135–145)

## 2013-09-05 ENCOUNTER — Telehealth: Payer: Self-pay | Admitting: Internal Medicine

## 2013-09-05 MED ORDER — CARVEDILOL 6.25 MG PO TABS: 6.2500 mg | ORAL_TABLET | Freq: Two times a day (BID) | ORAL | Status: DC

## 2013-09-05 NOTE — Telephone Encounter (Signed)
Follow Up  Son returned call.

## 2013-09-05 NOTE — Telephone Encounter (Signed)
Left pt's son a message to call back. 

## 2013-09-05 NOTE — Telephone Encounter (Signed)
Returned call to patient's son [name] spoke to DOD Dr.McAlhany regarding mother's elevated B/P he advised to increase carvedilol 6.25 mg twice a day.Advised to monitor B/P over the weekend and call Dr.Talyor's nurse Tresa Endo back on Monday 09/08/13.

## 2013-09-05 NOTE — Telephone Encounter (Signed)
Returned call to son [name] he stated he was very concerned about mother's B/P.Stated her B/P is still elevated today 200/93 pulse 83, ranging 196,191 systolic.Stated she started taking carvedilol 3.25 mg twice a day and it don' seem to helping.Stated he was worried that she needs to reschedule pacemaker change next Wednesday 09/10/13.Message sent to Dr.Taylor for advice.

## 2013-09-05 NOTE — Telephone Encounter (Signed)
New Problem  Pt Bp is higher with previously prescribed beta blocker. Pt's son is concerned that the high blood pressure will increase the risk of a stroke.Marland Kitchen He asks for an alternative to get the BP down. Please advise.

## 2013-09-08 ENCOUNTER — Telehealth: Payer: Self-pay | Admitting: Internal Medicine

## 2013-09-08 NOTE — Telephone Encounter (Signed)
Spoke with pts son and told him I would call him as soon as I talked with Dr. Eldridge Dace. BP has been elevated for the last couple of weeks and has slowly gotten worse. Pt has a HA and diarrhea is gone. The Last BP reading was 189/95. See note by Tresa Endo as well.

## 2013-09-08 NOTE — Telephone Encounter (Signed)
New Problem:  Pt's son states his mom's BP is still up and he would like to postpone pacemaker surgery. Pt's son also states he would like to talk to a nurse about his moms BP meds.

## 2013-09-08 NOTE — Telephone Encounter (Signed)
Follow Up:  Pt's is calling back wanting to speak to [facility].

## 2013-09-08 NOTE — Telephone Encounter (Signed)
Spoke with patient's son, she was unable to tolerate the increased dose of Carvedilol as prescribed by the DOD Friday secondary to diarrhea.  Pressure on Sat after the increased dose was still elevated at 164/73 but much improved.  Sunday and today it was 195/83 and 189/0 respectively  Her HR has remained stable in the 70's.  She is complaining of a headache and he wants to know what she should take.  She is scheduled to have her PPM generator replace on Wed.  He has vociced concern as to doing this with her BP up.  Her BP on 09/02/13 while seeing Dr Ladona Ridgel wa 170/76 and he was aware of date of change out.  Please advise

## 2013-09-09 ENCOUNTER — Encounter (HOSPITAL_COMMUNITY): Payer: Self-pay | Admitting: Pharmacy Technician

## 2013-09-09 MED ORDER — AMLODIPINE BESYLATE 5 MG PO TABS: 5.0000 mg | ORAL_TABLET | Freq: Every day | ORAL | Status: DC

## 2013-09-09 MED ORDER — ATORVASTATIN CALCIUM 20 MG PO TABS: 20.0000 mg | ORAL_TABLET | Freq: Every day | ORAL | Status: DC

## 2013-09-09 MED ORDER — CEFAZOLIN SODIUM-DEXTROSE 2-3 GM-% IV SOLR
2.0000 g | INTRAVENOUS | Status: DC
  Filled 2013-09-09: qty 50

## 2013-09-09 MED ORDER — SODIUM CHLORIDE 0.9 % IR SOLN
80.0000 mg | Status: DC
  Filled 2013-09-09: qty 2

## 2013-09-09 MED ORDER — CARVEDILOL 6.25 MG PO TABS: 3.1250 mg | ORAL_TABLET | Freq: Two times a day (BID) | ORAL | Status: DC

## 2013-09-09 MED ORDER — SODIUM CHLORIDE 0.9 % IV SOLN
INTRAVENOUS | Status: DC
  Administered 2013-09-10: 07:00:00 via INTRAVENOUS

## 2013-09-09 NOTE — Telephone Encounter (Addendum)
Per Dr. Eldridge Dace pt should stop simvastatin 40mg  and start atorvastatin 20mg  because of drug interaction with amlodipine. Pts son notified of med change/

## 2013-09-09 NOTE — Addendum Note (Signed)
Addended byOrlene Plum H on: 09/09/2013 09:52 AM   Modules accepted: Orders, Medications

## 2013-09-09 NOTE — Addendum Note (Signed)
Addended byOrlene Plum H on: 09/09/2013 07:57 AM   Modules accepted: Orders

## 2013-09-09 NOTE — Telephone Encounter (Signed)
Pts coreg updated to 3.125mg  BID daily because pt had diarrhea with the increased dosage therefore that is why pt decreased back to 3.125mg .

## 2013-09-09 NOTE — Telephone Encounter (Addendum)
Per Dr. Eldridge Dace pt can start Amlodipine 5mg  po Daily. Pts son notifed and rx sent to Pharmacy. Pt is taking Simvastatin 40mg  which is an interaction with the amlodipine. Should pt decrease simvastatin or can pt stay on the 40mg .

## 2013-09-10 ENCOUNTER — Encounter (HOSPITAL_COMMUNITY)
Admission: RE | Disposition: A | Discharge status: Home / Self Care | Payer: Self-pay | Source: Ambulatory Visit | Attending: Internal Medicine

## 2013-09-10 ENCOUNTER — Ambulatory Visit (HOSPITAL_COMMUNITY)
Admission: RE | Admit: 2013-09-10 | Discharge: 2013-09-10 | Disposition: A | Discharge status: Home / Self Care | Payer: Medicare Other | Source: Ambulatory Visit | Attending: Internal Medicine | Admitting: Internal Medicine

## 2013-09-10 DIAGNOSIS — E039 Hypothyroidism, unspecified: Secondary | ICD-10-CM | POA: Insufficient documentation

## 2013-09-10 DIAGNOSIS — E785 Hyperlipidemia, unspecified: Secondary | ICD-10-CM | POA: Insufficient documentation

## 2013-09-10 DIAGNOSIS — I4891 Unspecified atrial fibrillation: Secondary | ICD-10-CM | POA: Insufficient documentation

## 2013-09-10 DIAGNOSIS — Z45018 Encounter for adjustment and management of other part of cardiac pacemaker: Secondary | ICD-10-CM

## 2013-09-10 DIAGNOSIS — K219 Gastro-esophageal reflux disease without esophagitis: Secondary | ICD-10-CM | POA: Insufficient documentation

## 2013-09-10 DIAGNOSIS — I442 Atrioventricular block, complete: Secondary | ICD-10-CM | POA: Insufficient documentation

## 2013-09-10 DIAGNOSIS — I495 Sick sinus syndrome: Secondary | ICD-10-CM | POA: Insufficient documentation

## 2013-09-10 DIAGNOSIS — I10 Essential (primary) hypertension: Secondary | ICD-10-CM | POA: Insufficient documentation

## 2013-09-10 HISTORY — PX: PACEMAKER GENERATOR CHANGE: SHX5481

## 2013-09-10 LAB — SURGICAL PCR SCREEN
MRSA, PCR: NEGATIVE
Staphylococcus aureus: POSITIVE — AB

## 2013-09-10 SURGERY — PACEMAKER GENERATOR CHANGE
Anesthesia: LOCAL

## 2013-09-10 MED ORDER — ONDANSETRON HCL 4 MG/2ML IJ SOLN: 4.0000 mg | Freq: Four times a day (QID) | INTRAMUSCULAR | Status: DC | PRN

## 2013-09-10 MED ORDER — HEPARIN (PORCINE) IN NACL 2-0.9 UNIT/ML-% IJ SOLN
INTRAMUSCULAR | Status: AC
  Filled 2013-09-10: qty 500

## 2013-09-10 MED ORDER — CHLORHEXIDINE GLUCONATE 4 % EX LIQD
60.0000 mL | Freq: Once | CUTANEOUS | Status: DC
  Filled 2013-09-10: qty 60

## 2013-09-10 MED ORDER — HYDRALAZINE HCL 20 MG/ML IJ SOLN
INTRAMUSCULAR | Status: AC
  Filled 2013-09-10: qty 1

## 2013-09-10 MED ORDER — MUPIROCIN 2 % EX OINT
TOPICAL_OINTMENT | Freq: Two times a day (BID) | CUTANEOUS | Status: DC
  Administered 2013-09-10: 1 via NASAL
  Filled 2013-09-10: qty 22

## 2013-09-10 MED ORDER — HYDRALAZINE HCL 20 MG/ML IJ SOLN
10.0000 mg | Freq: Once | INTRAMUSCULAR | Status: AC
  Administered 2013-09-10: 10 mg via INTRAVENOUS

## 2013-09-10 MED ORDER — FENTANYL CITRATE 0.05 MG/ML IJ SOLN
INTRAMUSCULAR | Status: AC
  Filled 2013-09-10: qty 2

## 2013-09-10 MED ORDER — ACETAMINOPHEN 325 MG PO TABS: 325.0000 mg | ORAL_TABLET | ORAL | Status: DC | PRN

## 2013-09-10 MED ORDER — MUPIROCIN 2 % EX OINT
TOPICAL_OINTMENT | Freq: Two times a day (BID) | CUTANEOUS | Status: DC
  Filled 2013-09-10: qty 22

## 2013-09-10 MED ORDER — LIDOCAINE HCL (PF) 1 % IJ SOLN
INTRAMUSCULAR | Status: AC
  Filled 2013-09-10: qty 60

## 2013-09-10 MED ORDER — MUPIROCIN 2 % EX OINT
TOPICAL_OINTMENT | CUTANEOUS | Status: AC
  Filled 2013-09-10: qty 22

## 2013-09-10 MED ORDER — MIDAZOLAM HCL 5 MG/5ML IJ SOLN
INTRAMUSCULAR | Status: AC
  Filled 2013-09-10: qty 5

## 2013-09-10 NOTE — Interval H&P Note (Signed)
History and Physical Interval Note:Since prior clinic visit, no change in the history, physical exam, assessment and plan. She has complete heart block and presents for ppm generator change as her device is at ERI.  09/10/2013 7:58 AM  [name]  has presented today for surgery, with the diagnosis of End of life  The various methods of treatment have been discussed with the patient and family. After consideration of risks, benefits and other options for treatment, the patient has consented to  Procedure(s): PACEMAKER GENERATOR CHANGE (N/A) as a surgical intervention .  The patient's history has been reviewed, patient examined, no change in status, stable for surgery.  I have reviewed the patient's chart and labs.  Questions were answered to the patient's satisfaction.     Lewayne Bunting

## 2013-09-10 NOTE — CV Procedure (Signed)
Electrophysiology procedure note  Procedure: Removal of previously implanted dual-chamber pacemaker which had reached elective replacement, and insertion of a new dual-chamber pacemaker.  Indication: Sinus node dysfunction  Findings: After informed consent was obtained, the patient was taken to the diagnostic electrophysiology laboratory in the fasting state. After the usual preparation and draping, 30 cc of lidocaine was infiltrated over the old pacemaker insertion site in the right infraclavicular region. A 5 cm incision was carried out over this region. Electrocautery was utilized to dissect down to the pacemaker pocket. The pacemaker pocket was entered and the generator was removed. The atrial and ventricular pacing leads were evaluated and found to be working satisfactorily. P waves were 2, and R waves were greater than 12. The impedance in the atrium was 350 and in the ventricle was 590. The patient threshold was less than a volt at 0.4 ms in both the atrium and the ventricle. The new St. Jude dual-chamber pacemaker, serial number [id number] was connected to the old atrial and ventricular pacing leads and placed back in the subcutaneous pocket. The pocket was irrigated with antibiotic irrigation, and the incision was closed with 2-0 and 3-0 Vicryl suture. Benzoin and Steri-Strips were placed on the skin, a pressure dressing was placed, and the patient was returned to her room in satisfactory condition.  Complications: There were no immediate procedure complications.  Conclusion: This demonstrate successful removal of a previous implanted dual-chamber pacemaker which was at elective replacement in a patient with sinus node dysfunction, and successful insertion of a new dual-chamber pacemaker without immediate procedure complications.  Lewayne Bunting, M.D.

## 2013-09-10 NOTE — H&P (View-Only) (Signed)
HPI [name] is referred today for ongoing evaluation and management of her permanent pacemaker. She is a very pleasant 79-year-old woman with complete heart block, paroxysmal atrial fibrillation, and is status post permanent pacemaker insertion. She has a history of hypertension. Her blood pressures have recently been elevated. She has reached elective replacement on her pacemaker and has reverted to single chamber pacing mode to conserve her battery. The patient has class II shortness of breath. She has a history of peripheral edema though it is been well-controlled. She denies syncope. Her atrial fibrillation has been treated with sotalol therapy. She has not had syncope. Her main complaint today is that her blood pressure has been elevated. With this she feels shortness of breath. No Known Allergies   Current Outpatient Prescriptions  Medication Sig Dispense Refill  . acetaminophen (TYLENOL) 325 MG tablet Take 325 mg by mouth every 6 (six) hours as needed for pain.      . allopurinol (ZYLOPRIM) 100 MG tablet Take 1 tablet (100 mg total) by mouth 2 (two) times daily.  60 tablet  1  . apixaban (ELIQUIS) 5 MG TABS tablet Take 1 tablet (5 mg total) by mouth 2 (two) times daily.  60 tablet  1  . calcitRIOL (ROCALTROL) 0.25 MCG capsule Take 1 capsule (0.25 mcg total) by mouth every morning.  30 capsule  1  . CRANBERRY PO Take 2 capsules by mouth 2 (two) times daily.      . dextromethorphan-guaiFENesin (MUCINEX DM) 30-600 MG per 12 hr tablet Take 1 tablet by mouth every 12 (twelve) hours.      . diazepam (VALIUM) 10 MG tablet Take 10 mg by mouth daily.      . DULoxetine (CYMBALTA) 60 MG capsule Take 1 capsule (60 mg total) by mouth every morning.  30 capsule  1  . ferrous gluconate (FERGON) 325 MG tablet Take 325 mg by mouth daily with breakfast.      . ipratropium (ATROVENT) 0.06 % nasal spray Place 2 sprays into the nose every morning.      . levothyroxine (SYNTHROID, LEVOTHROID) 25 MCG tablet  Take 1 tablet (25 mcg total) by mouth every morning.  30 tablet  1  . lisinopril (PRINIVIL,ZESTRIL) 10 MG tablet Take 1 tablet (10 mg total) by mouth daily.  30 tablet  1  . Multiple Vitamin (MULTIVITAMIN WITH MINERALS) TABS Take 1 tablet by mouth every morning.       . omega-3 acid ethyl esters (LOVAZA) 1 G capsule Take 1 capsule (1 g total) by mouth every morning.  30 capsule  1  . pantoprazole (PROTONIX) 40 MG tablet Take 1 tablet (40 mg total) by mouth 2 (two) times daily.  60 tablet  1  . predniSONE (DELTASONE) 10 MG tablet 10 mg daily.      . simvastatin (ZOCOR) 40 MG tablet Take 1 tablet (40 mg total) by mouth every evening.  30 tablet  1  . sotalol (BETAPACE) 120 MG tablet Take 1 tablet (120 mg total) by mouth 2 (two) times daily.  60 tablet  1  . tiZANidine (ZANAFLEX) 2 MG tablet 2 mg daily.      . carvedilol (COREG) 3.125 MG tablet Take 1 tablet (3.125 mg total) by mouth 2 (two) times daily with a meal.  60 tablet  11   No current facility-administered medications for this visit.     Past Medical History  Diagnosis Date  . Hypertension   . Hyperlipidemia   . GERD (gastroesophageal reflux   disease)   . Dysphagia     appt with Robert Buccini upcoming  . Chronic recurrent sinusitis   . Recurrent UTI   . Atrial fibrillation     ???  . Arthritis   . TIA (transient ischemic attack)   . Anemia   . Hypothyroidism     ROS:   All systems reviewed and negative except as noted in the HPI.   Past Surgical History  Procedure Laterality Date  . Pacemaker insertion    . Back surgery  1985  . Shoulder surgery  2008    Left  (Dr. Norris)  . Total knee arthroplasty  2000, 2001    bilateral (Dr. Alusio), left 2000, right 2001  . Cataract extraction  1986, 2003    left 1986, right 2003  . Vaginal hysterectomy  1969  . Bilateral salpingoophorectomy  1971  . Tonsillectomy  1951  . Appendectomy  1952  . Mastoidectomy  1970  . Sinus exploration  1972  . Knee arthroscopy  1988,  1995, 2000  . Hemorrhoid surgery  2009  . Repair / reconstruction interphalangeal joint  2004    R thumb  . Cystoscopy  1992  . Insert / replace / remove pacemaker       History reviewed. No pertinent family history.   History   Social History  . Marital Status: Divorced    Spouse Name: N/A    Number of Children: 5  . Years of Education: N/A   Occupational History  . Office Manager     retired   Social History Main Topics  . Smoking status: Former Smoker -- 1.00 packs/day for 15 years    Types: Cigarettes    Quit date: 12/04/1981  . Smokeless tobacco: Not on file  . Alcohol Use: No  . Drug Use: No  . Sexual Activity: No   Other Topics Concern  . Not on file   Social History Narrative  . No narrative on file     BP 170/76  Pulse 73  Ht 5' 2" (1.575 m)  Wt 142 lb (64.411 kg)  BMI 25.97 kg/m2  Physical Exam:  Well appearing 79-year-old woman,NAD HEENT: Unremarkable Neck:  No JVD, no thyromegally Back:  No CVA tenderness Lungs:  Clear with no wheezes, rales, or rhonchi. Well-healed  Right-sided pacemaker implant HEART:  Regular rate rhythm, no murmurs, no rubs, no clicks Abd:  soft, positive bowel sounds, no organomegally, no rebound, no guarding Ext:  2 plus pulses, no edema, no cyanosis, no clubbing Skin:  No rashes no nodules Neuro:  CN II through XII intact, motor grossly intact   DEVICE  Normal device function.  See PaceArt for details.  Device at elective replacement  Assess/Plan: 

## 2013-09-16 ENCOUNTER — Telehealth: Payer: Self-pay | Admitting: Interventional Cardiology

## 2013-09-16 MED ORDER — AMLODIPINE BESYLATE 10 MG PO TABS: 10.0000 mg | ORAL_TABLET | Freq: Every day | ORAL | Status: DC

## 2013-09-16 NOTE — Telephone Encounter (Signed)
called pts son to tell them to increase amlodipine and meds updated

## 2013-09-16 NOTE — Telephone Encounter (Signed)
Verified meds with pts son.

## 2013-09-16 NOTE — Telephone Encounter (Signed)
Spoke with pts son and pts BP is running 180's/70-60's with occasional drop into 160's systolic. Pt is having some HA's. Pt is doing fine after pacemaker change. Please advise. Pts son is concerned with continued elevated BP.

## 2013-09-16 NOTE — Telephone Encounter (Signed)
New message   Continue linsinapril and carvedilol since you doubled the amlodipine

## 2013-09-16 NOTE — Telephone Encounter (Signed)
Increase amlodipine to 10 mg daily

## 2013-09-16 NOTE — Telephone Encounter (Signed)
New Problem  Pt Bp is extremly high// before surgery medications were changed however BP is still high.

## 2013-09-19 ENCOUNTER — Telehealth: Payer: Self-pay | Admitting: Interventional Cardiology

## 2013-09-19 DIAGNOSIS — Z79899 Other long term (current) drug therapy: Secondary | ICD-10-CM

## 2013-09-19 MED ORDER — LISINOPRIL 40 MG PO TABS: 40.0000 mg | ORAL_TABLET | Freq: Every day | ORAL | Status: DC

## 2013-09-19 NOTE — Telephone Encounter (Signed)
Increase lisinopril to 40 mg daily.  BMet in one week.  

## 2013-09-19 NOTE — Telephone Encounter (Signed)
New Problem  Pt's son called and states that the medication amlodipine was increased to 2 tablets daily// He says that her BP is still very high// please assist 195/108 @ 8 am 10/17 194/84 @ 8:45pm 10/16 189/91 on 10/15

## 2013-09-19 NOTE — Telephone Encounter (Signed)
To Dr. Varanasi, please advise.  

## 2013-09-19 NOTE — Telephone Encounter (Signed)
Pts son notified. Meds updated and lab ordered.

## 2013-09-19 NOTE — Telephone Encounter (Signed)
Lm for pts son to rc

## 2013-09-22 ENCOUNTER — Institutional Professional Consult (permissible substitution): Payer: BLUE CROSS/BLUE SHIELD | Admitting: Neurology

## 2013-09-25 ENCOUNTER — Ambulatory Visit (INDEPENDENT_AMBULATORY_CARE_PROVIDER_SITE_OTHER): Payer: Medicare Other | Admitting: Neurology

## 2013-09-25 ENCOUNTER — Encounter: Payer: Self-pay | Admitting: Neurology

## 2013-09-25 ENCOUNTER — Ambulatory Visit (INDEPENDENT_AMBULATORY_CARE_PROVIDER_SITE_OTHER): Payer: Medicare Other | Admitting: *Deleted

## 2013-09-25 ENCOUNTER — Other Ambulatory Visit (INDEPENDENT_AMBULATORY_CARE_PROVIDER_SITE_OTHER): Payer: Medicare Other

## 2013-09-25 VITALS — BP 145/67 | HR 61 | Temp 98.5°F

## 2013-09-25 DIAGNOSIS — I495 Sick sinus syndrome: Secondary | ICD-10-CM

## 2013-09-25 DIAGNOSIS — R269 Unspecified abnormalities of gait and mobility: Secondary | ICD-10-CM

## 2013-09-25 DIAGNOSIS — Z79899 Other long term (current) drug therapy: Secondary | ICD-10-CM

## 2013-09-25 DIAGNOSIS — I6350 Cerebral infarction due to unspecified occlusion or stenosis of unspecified cerebral artery: Secondary | ICD-10-CM

## 2013-09-25 DIAGNOSIS — I639 Cerebral infarction, unspecified: Secondary | ICD-10-CM

## 2013-09-25 LAB — PACEMAKER DEVICE OBSERVATION
AL AMPLITUDE: 2.1 mv
AL IMPEDENCE PM: 337.5 Ohm
AL THRESHOLD: 1 V
ATRIAL PACING PM: 55
DEVICE MODEL PM: 7542144
RV LEAD AMPLITUDE: 12 mv
RV LEAD IMPEDENCE PM: 562.5 Ohm
RV LEAD THRESHOLD: 0.75 V

## 2013-09-25 NOTE — Progress Notes (Signed)
Wound check appointment. Steri-strips removed. Wound without redness or edema. Incision edges approximated, wound well healed. Normal device function. Thresholds, sensing, and impedances consistent with implant measurements. Device programmed at 3.5V/auto capture programmed on for extra safety margin until 3 month visit. Histogram distribution appropriate for patient and level of activity. 529 mode switches, 3.3%, + eliquis.  5 high ventricular rates noted. Patient educated about wound care, arm mobility, lifting restrictions. ROV in 3 months with implanting physician.

## 2013-09-25 NOTE — Patient Instructions (Addendum)
Continue Eliquis for stroke prevention due to her atrial fibrillation and check carotid ultrasound and transcranial dopplers. Increase Zanaflex to 2 mg twice daily for one week and then to 2 mg in a.m. and 4 mg at bedtime first spasticity and gait difficulty. Check CT scan of the cervical and thoracic spine for spinal stenosis. Continue ongoing physical and occupational therapy. Return for followup in 6 weeks or call earlier if necessary. y.

## 2013-09-26 LAB — BASIC METABOLIC PANEL
BUN: 16 mg/dL (ref 6–23)
CO2: 30 mEq/L (ref 19–32)
Chloride: 100 mEq/L (ref 96–112)
Creatinine, Ser: 1.2 mg/dL (ref 0.4–1.2)
GFR: 46.78 mL/min — ABNORMAL LOW (ref >60.00)

## 2013-09-28 NOTE — Progress Notes (Signed)
[facility] [address] [place]. [place] [address] [phone]       OFFICE CONSULT NOTE  [name] Date of Birth:  07-07-1931 Medical Record Number:  [id number]   Referring MD:  [name]  Reason for Referral:  Gait difficulty  HPI: [name] is a pleasant 79 year old Caucasian lady who has been having progressive gait and balance difficulties for the last several months. She was diagnosed with polymyalgia this summer when she developed fever with muscle aches and pains. She saw a rheumatologist [name] in [place] and underwent temporal artery biopsy which was negative for giant cell arteritis. She was on high-dose prednisone which is currently being tapered and noticed difficulty with walking with progressive falls. In fact she is currently in a wheelchair and is barely able to walk. She does have a prior history of a TIA in July 2013 when she saw me and was found to have atrial fibrillation and was advised not to stay on anticoagulation as she was a fall risk even then.. She apparently had another stroke in March and had some speech gait and balance difficulties but did recover from that and was able to walk with a walker and needed physical, occupational therapy and rehabilitation.She has been on Eliquis since then.. She denies any significant pain in her neck shooting down her spine though she does have history of degenerative arthritis and has undergone low back surgery in the past by [name]. She denies any numbness, tingling, bladder or bowel control problems. She states that when her left leg does not move and she has no control over it when she is trying to walk.  ROS:   14 system review of systems is positive for gait and balance difficulty, neck pain, shoulder pain, muscle aches, headaches, frequent falls, low back pain  PMH:  Past Medical History  Diagnosis Date  . Hypertension   . Hyperlipidemia   . GERD (gastroesophageal reflux  disease)   . Dysphagia     appt with [name] upcoming  . Chronic recurrent sinusitis   . Recurrent UTI   . Atrial fibrillation     ???  . Arthritis   . TIA (transient ischemic attack)   . Anemia   . Hypothyroidism     Social History:  History   Social History  . Marital Status: Divorced    Spouse Name: N/A    Number of Children: 5  . Years of Education: N/A   Occupational History  . Print production planner     retired   Social History Main Topics  . Smoking status: Former Smoker -- 1.00 packs/day for 15 years    Types: Cigarettes    Quit date: 12/04/1981  . Smokeless tobacco: Not on file  . Alcohol Use: No  . Drug Use: No  . Sexual Activity: No   Other Topics Concern  . Not on file   Social History Narrative  . No narrative on file    Medications:   Current Outpatient Prescriptions on File Prior to Visit  Medication Sig Dispense Refill  . acetaminophen-codeine (TYLENOL #4) 300-60 MG per tablet Take 1 tablet by mouth at bedtime.      Marland Kitchen allopurinol (ZYLOPRIM) 100 MG tablet Take 1 tablet (100 mg total) by mouth 2 (two) times daily.  60 tablet  1  . amLODipine (NORVASC) 10 MG tablet Take 1 tablet (10 mg total) by mouth daily.  180 tablet  3  . apixaban (ELIQUIS) 2.5 MG  TABS tablet Take 5 mg by mouth daily.      Marland Kitchen atorvastatin (LIPITOR) 20 MG tablet Take 20 mg by mouth at bedtime.      . calcitRIOL (ROCALTROL) 0.25 MCG capsule Take 1 capsule (0.25 mcg total) by mouth every morning.  30 capsule  1  . carvedilol (COREG) 3.125 MG tablet Take 3.125 mg by mouth 2 (two) times daily with a meal.      . CRANBERRY PO Take 3 capsules by mouth daily.       Marland Kitchen dextromethorphan-guaiFENesin (MUCINEX DM) 30-600 MG per 12 hr tablet Take 2 tablets by mouth daily.      . diazepam (VALIUM) 10 MG tablet Take 10 mg by mouth at bedtime.       . DULoxetine (CYMBALTA) 60 MG capsule Take 1 capsule (60 mg total) by mouth every morning.  30 capsule  1  . ferrous gluconate (FERGON) 325 MG tablet  Take 325 mg by mouth daily with breakfast.      . ipratropium (ATROVENT) 0.06 % nasal spray Place 1 spray into the nose every morning.       Marland Kitchen levothyroxine (SYNTHROID, LEVOTHROID) 25 MCG tablet Take 1 tablet (25 mcg total) by mouth every morning.  30 tablet  1  . Multiple Vitamin (MULTIVITAMIN WITH MINERALS) TABS Take 1 tablet by mouth every morning.       Marland Kitchen OMEGA-3 KRILL OIL PO Take 1 capsule by mouth daily.      . pantoprazole (PROTONIX) 40 MG tablet Take 80 mg by mouth daily.      . sotalol (BETAPACE) 120 MG tablet Take 1 tablet (120 mg total) by mouth 2 (two) times daily.  60 tablet  1  . tiZANidine (ZANAFLEX) 2 MG tablet Take 2 mg by mouth 2 (two) times daily.       Marland Kitchen triamcinolone (NASACORT) 55 MCG/ACT nasal inhaler Place 1 spray into the nose daily.       No current facility-administered medications on file prior to visit.    Allergies:  No Known Allergies  Physical Exam General: well developed, well nourished elderly caucasian lady, seated, in no evident distress Head: head normocephalic and atraumatic. Orohparynx benign Neck: supple with no carotid or supraclavicular bruits Cardiovascular: regular rate and rhythm, no murmurs Musculoskeletal: no deformity. Left shoulder pain limits abduction. Skin:  no rash/petichiae Vascular:  Normal pulses all extremities Filed Vitals:   09/25/13 1308  BP: 145/67  Pulse: 61  Temp: 98.5 F (36.9 C)    Neurologic Exam Mental Status: Awake and fully alert. Oriented to place and time. Recent and remote memory intact. Attention span, concentration and fund of knowledge appropriate. Mood and affect appropriate.  Cranial Nerves: Fundoscopic exam reveals sharp disc margins. Pupils equal, briskly reactive to light. Extraocular movements full without nystagmus. Visual fields full to confrontation. Hearing intact. Facial sensation intact. Face is weak on left lower half and , tongue, palate moves normally and symmetrically.  Motor: Normal bulk and  tone. Normal strength in right sided extremity muscles. But mild left sided weakness in hand grip, intrinsic hand muscles and left hip and ankle dorsiflexors. Increased tone left leg with spasticity. Left shoulder pain limits abduction Sensory.: intact to touch and pinprick and vibratory sensation.  Coordination: Rapid alternating movements normal in all extremities. Finger-to-nose and heel-to-shin performed accurately on right but impaired on left. Gait and Station: Arises from chair with  Difficulty and needs 2 person assist.l. Gait demonstrates normal stride length but with spasticity and apraxia  of left leg with turns and initiationbalance . Able to heel, toe and tandem walk without difficulty.  Reflexes: 1+ and asymmetric and brisker on left side. Toes downgoing.    ASSESSMENT: 55 year lady with progressive gait difficulties since last 2-3 months of multifactorial etiology likleey combination of deconditioning from recent illness, prior stroke, polymyalgia, degenerative arthritis and left leg spasticity.H/o TIA in July 2013 and stroke in March 2014    PLAN:  Continue Eliquis for stroke prevention due to her atrial fibrillation and check carotid ultrasound and transcranial dopplers. Increase Zanaflex to 2 mg twice daily for one week and then to 2 mg in a.m. and 4 mg at bedtime first spasticity and gait difficulty. Check CT scan of the cervical and thoracic spine for spinal stenosis. Continue ongoing physical and occupational therapy. Return for followup in 6 weeks or call earlier if necessary.

## 2013-09-29 ENCOUNTER — Telehealth: Payer: Self-pay | Admitting: Interventional Cardiology

## 2013-09-29 NOTE — Progress Notes (Signed)
Lm for pts son to rc 

## 2013-09-29 NOTE — Telephone Encounter (Signed)
Spoke with pts son and first thing in the am pts BP is 185/86. Throughout the day BP is running 170's/70's. Pt is still having HA's. Pt started having swelling in her feet about a couple weeks ago as well.

## 2013-09-29 NOTE — Telephone Encounter (Signed)
New Problem   Pt's Son called about her increased  BP  10/24   198/83  , 10/25  182/71,   10/27   185/76.  Still running high with new med, he would like a call back please.

## 2013-09-29 NOTE — Telephone Encounter (Signed)
Also, pt seen her neurologist and he increased her Tizanidine from 2mg  to 4 mg daily. To Dr. Eldridge Dace, please advise.

## 2013-09-30 MED ORDER — HYDRALAZINE HCL 25 MG PO TABS: 25.0000 mg | ORAL_TABLET | Freq: Three times a day (TID) | ORAL | Status: DC

## 2013-09-30 NOTE — Telephone Encounter (Signed)
Sent in rx and pts son [name] notified. He will continue to monitor BP and he will call back if BP continues to remain high after adding hydralazine.

## 2013-09-30 NOTE — Telephone Encounter (Signed)
Can start hydralazine 25 mg by mouth 3 times a day

## 2013-10-02 ENCOUNTER — Encounter: Payer: Self-pay | Admitting: Internal Medicine

## 2013-10-08 ENCOUNTER — Ambulatory Visit: Payer: BLUE CROSS/BLUE SHIELD

## 2013-10-08 ENCOUNTER — Ambulatory Visit
Admission: RE | Admit: 2013-10-08 | Discharge: 2013-10-08 | Disposition: A | Discharge status: Home / Self Care | Payer: Medicare Other | Source: Ambulatory Visit | Attending: Neurology | Admitting: Neurology

## 2013-10-08 ENCOUNTER — Ambulatory Visit
Admission: RE | Admit: 2013-10-08 | Discharge: 2013-10-08 | Disposition: A | Discharge status: Home / Self Care | Payer: Medicare Other | Source: Ambulatory Visit | Attending: Otolaryngology | Admitting: Otolaryngology

## 2013-10-08 ENCOUNTER — Other Ambulatory Visit: Payer: Self-pay | Admitting: Neurology

## 2013-10-08 ENCOUNTER — Other Ambulatory Visit: Payer: Self-pay | Admitting: Otolaryngology

## 2013-10-08 DIAGNOSIS — R269 Unspecified abnormalities of gait and mobility: Secondary | ICD-10-CM

## 2013-10-08 DIAGNOSIS — R05 Cough: Secondary | ICD-10-CM

## 2013-10-14 ENCOUNTER — Ambulatory Visit (INDEPENDENT_AMBULATORY_CARE_PROVIDER_SITE_OTHER): Payer: Medicare Other

## 2013-10-14 DIAGNOSIS — I639 Cerebral infarction, unspecified: Secondary | ICD-10-CM

## 2013-10-14 DIAGNOSIS — I6350 Cerebral infarction due to unspecified occlusion or stenosis of unspecified cerebral artery: Secondary | ICD-10-CM

## 2013-10-14 DIAGNOSIS — Z0289 Encounter for other administrative examinations: Secondary | ICD-10-CM

## 2013-10-17 ENCOUNTER — Other Ambulatory Visit: Payer: BLUE CROSS/BLUE SHIELD

## 2013-11-11 ENCOUNTER — Encounter: Payer: Self-pay | Admitting: Neurology

## 2013-11-11 ENCOUNTER — Ambulatory Visit (INDEPENDENT_AMBULATORY_CARE_PROVIDER_SITE_OTHER): Payer: Medicare Other | Admitting: Neurology

## 2013-11-11 ENCOUNTER — Encounter (INDEPENDENT_AMBULATORY_CARE_PROVIDER_SITE_OTHER): Payer: Self-pay

## 2013-11-11 VITALS — BP 120/58 | HR 61 | Temp 97.2°F

## 2013-11-11 DIAGNOSIS — R269 Unspecified abnormalities of gait and mobility: Secondary | ICD-10-CM

## 2013-11-11 NOTE — Patient Instructions (Signed)
Continue physical therapy for gait and balance when it is okay with orthopedic surgeon to bear weight on her right foot. No further neurological workup is necessary at the present time. I advised her to see her primary physician for the redness in her right eye. She may return for followup with me in the future only as necessary and no followup appointment was made.

## 2013-11-11 NOTE — Progress Notes (Signed)
[facility] [address] [place]. [place] [address] [phone]       OFFICE CONSULT NOTE  Bridget. [name] Date of Birth:  Nov 13, 1931 Medical Record Number:  [id number]   Referring MD:  Holley Bouche  Reason for Referral:  Gait difficulty  HPI:  She returns for followup today after his initial consult 6 weeks ago. She had an unfortunate fall a few weeks ago and twisted her right foot and is has been seen by Dr Penni Bombard at [facility]   She is currently wearing a special boot for her right leg and is nonweightbearing. She underwent CT scan of the head on 10/08/13 which showed mild changes of chronic microvascular ischemia and generalized atrophy without acute abnormality. CT scan of the cervical and thoracic spine also showed multilevel mild degenerative changes without significant canal compression. She remains on Apixaban which is tolerating well without significant bleeding or bruising. She has discontinued lisinopril, krill oil and cranberry capsules and has been started on furosemide since her last visit he did  Initial visit 09/28/13 [name] is a pleasant 79 year old Caucasian lady who has been having progressive gait and balance difficulties for the last several months. She was diagnosed with polymyalgia this summer when she developed fever with muscle aches and pains. She saw a rheumatologist Dr. Dierdre Forth in [place] and underwent temporal artery biopsy which was negative for giant cell arteritis. She was on high-dose prednisone which is currently being tapered and noticed difficulty with walking with progressive falls. In fact she is currently in a wheelchair and is barely able to walk. She does have a prior history of a TIA in July 2013 when she saw me and was found to have atrial fibrillation and was advised not to stay on anticoagulation as she was a fall risk even then.. She apparently had another stroke in March and had some speech gait and balance  difficulties but did recover from that and was able to walk with a walker and needed physical, occupational therapy and rehabilitation.She has been on Eliquis since then.. She denies any significant pain in her neck shooting down her spine though she does have history of degenerative arthritis and has undergone low back surgery in the past by Dr. Otelia Sergeant. She denies any numbness, tingling, bladder or bowel control problems. She states that when her left leg does not move and she has no control over it when she is trying to walk.  ROS:   14 system review of systems is positive for gait and balance difficulty,   muscle aches, headaches, frequent falls, right leg pain and other systems are negative  PMH:  Past Medical History  Diagnosis Date  . Hypertension   . Hyperlipidemia   . GERD (gastroesophageal reflux disease)   . Dysphagia     appt with Molly Maduro Buccini upcoming  . Chronic recurrent sinusitis   . Recurrent UTI   . Atrial fibrillation     ???  . Arthritis   . TIA (transient ischemic attack)   . Anemia   . Hypothyroidism     Social History:  History   Social History  . Marital Status: Divorced    Spouse Name: N/A    Number of Children: 5  . Years of Education: N/A   Occupational History  . Print production planner     retired   Social History Main Topics  . Smoking status: Former Smoker -- 1.00 packs/day for 15 years    Types: Cigarettes  Quit date: 12/04/1981  . Smokeless tobacco: Never Used  . Alcohol Use: No  . Drug Use: No  . Sexual Activity: No   Other Topics Concern  . Not on file   Social History Narrative   Patient lives at home with    Medications:   Current Outpatient Prescriptions on File Prior to Visit  Medication Sig Dispense Refill  . acetaminophen-codeine (TYLENOL #4) 300-60 MG per tablet Take 1 tablet by mouth at bedtime.      Marland Kitchen allopurinol (ZYLOPRIM) 100 MG tablet Take 1 tablet (100 mg total) by mouth 2 (two) times daily.  60 tablet  1  . amLODipine  (NORVASC) 10 MG tablet Take 1 tablet (10 mg total) by mouth daily.  180 tablet  3  . apixaban (ELIQUIS) 2.5 MG TABS tablet Take 5 mg by mouth daily.      Marland Kitchen atorvastatin (LIPITOR) 20 MG tablet Take 20 mg by mouth at bedtime.      Marland Kitchen buPROPion (WELLBUTRIN XL) 150 MG 24 hr tablet       . calcitRIOL (ROCALTROL) 0.25 MCG capsule Take 1 capsule (0.25 mcg total) by mouth every morning.  30 capsule  1  . carvedilol (COREG) 3.125 MG tablet Take 3.125 mg by mouth 2 (two) times daily with a meal.      . dextromethorphan-guaiFENesin (MUCINEX DM) 30-600 MG per 12 hr tablet Take 2 tablets by mouth daily.      . diazepam (VALIUM) 10 MG tablet Take 10 mg by mouth at bedtime.       . DULoxetine (CYMBALTA) 60 MG capsule Take 1 capsule (60 mg total) by mouth every morning.  30 capsule  1  . ferrous gluconate (FERGON) 325 MG tablet Take 325 mg by mouth daily with breakfast.      . hydrALAZINE (APRESOLINE) 25 MG tablet Take 1 tablet (25 mg total) by mouth 3 (three) times daily.  270 tablet  3  . ipratropium (ATROVENT) 0.06 % nasal spray Place 1 spray into the nose every morning.       Marland Kitchen levothyroxine (SYNTHROID, LEVOTHROID) 25 MCG tablet Take 1 tablet (25 mcg total) by mouth every morning.  30 tablet  1  . Multiple Vitamin (MULTIVITAMIN WITH MINERALS) TABS Take 1 tablet by mouth every morning.       . pantoprazole (PROTONIX) 40 MG tablet Take 80 mg by mouth daily.      Marland Kitchen PREDNISONE PO Take 8 mg by mouth daily.       . sotalol (BETAPACE) 120 MG tablet Take 1 tablet (120 mg total) by mouth 2 (two) times daily.  60 tablet  1  . tiZANidine (ZANAFLEX) 2 MG tablet Take 2 mg by mouth 2 (two) times daily.       Marland Kitchen triamcinolone (NASACORT) 55 MCG/ACT nasal inhaler Place 1 spray into the nose daily.       No current facility-administered medications on file prior to visit.    Allergies:  No Known Allergies  Physical Exam General: well developed, well nourished elderly caucasian lady, seated, in no evident distress Head:  head normocephalic and atraumatic. Orohparynx benign Neck: supple with no carotid or supraclavicular bruits Cardiovascular: regular rate and rhythm, no murmurs Musculoskeletal: no deformity. Left shoulder pain limits abduction. Skin:  no rash/petichiae. Right eye medial subconjunctival redness. Vascular:  Normal pulses all extremities Filed Vitals:   11/11/13 1538  BP: 120/58  Pulse: 61  Temp: 97.2 F (36.2 C)    Neurologic Exam Mental Status: Awake  and fully alert. Oriented to place and time. Recent and remote memory intact. Attention span, concentration and fund of knowledge appropriate. Mood and affect appropriate.  Cranial Nerves: Fundoscopic exam reveals sharp disc margins. Pupils equal, briskly reactive to light. Extraocular movements full without nystagmus. Visual fields full to confrontation. Hearing intact. Facial sensation intact. Face is weak on left lower half and , tongue, palate moves normally and symmetrically.  Motor: Normal bulk and tone. Normal strength in right sided extremity muscles. But mild left sided weakness in hand grip, intrinsic hand muscles and left hip and ankle dorsiflexors. Increased tone left leg with spasticity. Left shoulder pain limits abduction Sensory.: intact to touch and pinprick and vibratory sensation.  Coordination: Rapid alternating movements normal in all extremities. Finger-to-nose and heel-to-shin performed accurately on right but impaired on left. Gait and Station: Gait deferred as patient sitting in a wheelchair and has right foot in am boot following recent injury  Reflexes: 1+ and asymmetric and brisker on left side. Toes downgoing.    ASSESSMENT: 80 year lady with progressive gait difficulties since last 2-3 months of multifactorial etiology likley combination of deconditioning from recent illness, prior stroke, polymyalgia, degenerative arthritis and left leg spasticity.H/o TIA in July 2013 and stroke in March 2014. New right foot pain from  twisting and possible ligamentous injury.    PLAN:  Continue Eliquis for stroke prevention due to her atrial fibrillation Continue physical therapy for gait and balance when it is okay with orthopedic surgeon to bear weight on her right foot. No further neurological workup is necessary at the present time. I advised her to see her primary physician for the redness in her right eye. She may return for followup with me in the future only as necessary and no followup appointment was made.

## 2013-11-12 ENCOUNTER — Encounter: Payer: Self-pay | Admitting: *Deleted

## 2014-01-15 ENCOUNTER — Other Ambulatory Visit (HOSPITAL_COMMUNITY): Payer: Self-pay | Admitting: *Deleted

## 2014-01-16 ENCOUNTER — Encounter (HOSPITAL_COMMUNITY): Payer: Medicare Other

## 2014-01-23 ENCOUNTER — Inpatient Hospital Stay (HOSPITAL_COMMUNITY): Admission: RE | Admit: 2014-01-23 | Payer: Medicare Other | Source: Ambulatory Visit

## 2014-02-05 ENCOUNTER — Inpatient Hospital Stay (HOSPITAL_COMMUNITY): Admission: RE | Admit: 2014-02-05 | Payer: Medicare Other | Source: Ambulatory Visit

## 2014-02-22 ENCOUNTER — Emergency Department (HOSPITAL_COMMUNITY): Payer: Medicare Other

## 2014-02-22 ENCOUNTER — Inpatient Hospital Stay (HOSPITAL_COMMUNITY)
Admission: EM | Admit: 2014-02-22 | Discharge: 2014-02-28 | DRG: 377 | Disposition: A | Discharge status: Hospice | Payer: Medicare Other | Attending: Internal Medicine | Admitting: Internal Medicine

## 2014-02-22 ENCOUNTER — Encounter (HOSPITAL_COMMUNITY): Payer: Self-pay | Admitting: Emergency Medicine

## 2014-02-22 DIAGNOSIS — R040 Epistaxis: Secondary | ICD-10-CM | POA: Diagnosis not present

## 2014-02-22 DIAGNOSIS — R195 Other fecal abnormalities: Secondary | ICD-10-CM | POA: Diagnosis present

## 2014-02-22 DIAGNOSIS — K5731 Diverticulosis of large intestine without perforation or abscess with bleeding: Principal | ICD-10-CM | POA: Diagnosis present

## 2014-02-22 DIAGNOSIS — Z823 Family history of stroke: Secondary | ICD-10-CM

## 2014-02-22 DIAGNOSIS — Z79899 Other long term (current) drug therapy: Secondary | ICD-10-CM

## 2014-02-22 DIAGNOSIS — E039 Hypothyroidism, unspecified: Secondary | ICD-10-CM | POA: Diagnosis present

## 2014-02-22 DIAGNOSIS — R82998 Other abnormal findings in urine: Secondary | ICD-10-CM | POA: Diagnosis present

## 2014-02-22 DIAGNOSIS — Z8673 Personal history of transient ischemic attack (TIA), and cerebral infarction without residual deficits: Secondary | ICD-10-CM

## 2014-02-22 DIAGNOSIS — D649 Anemia, unspecified: Secondary | ICD-10-CM | POA: Diagnosis present

## 2014-02-22 DIAGNOSIS — N179 Acute kidney failure, unspecified: Secondary | ICD-10-CM | POA: Diagnosis present

## 2014-02-22 DIAGNOSIS — Z87440 Personal history of urinary (tract) infections: Secondary | ICD-10-CM

## 2014-02-22 DIAGNOSIS — R3129 Other microscopic hematuria: Secondary | ICD-10-CM

## 2014-02-22 DIAGNOSIS — Z8249 Family history of ischemic heart disease and other diseases of the circulatory system: Secondary | ICD-10-CM

## 2014-02-22 DIAGNOSIS — G9341 Metabolic encephalopathy: Secondary | ICD-10-CM | POA: Diagnosis present

## 2014-02-22 DIAGNOSIS — E860 Dehydration: Secondary | ICD-10-CM | POA: Diagnosis present

## 2014-02-22 DIAGNOSIS — I4891 Unspecified atrial fibrillation: Secondary | ICD-10-CM | POA: Diagnosis present

## 2014-02-22 DIAGNOSIS — I509 Heart failure, unspecified: Secondary | ICD-10-CM | POA: Diagnosis present

## 2014-02-22 DIAGNOSIS — J9600 Acute respiratory failure, unspecified whether with hypoxia or hypercapnia: Secondary | ICD-10-CM | POA: Diagnosis present

## 2014-02-22 DIAGNOSIS — I5032 Chronic diastolic (congestive) heart failure: Secondary | ICD-10-CM | POA: Diagnosis present

## 2014-02-22 DIAGNOSIS — D62 Acute posthemorrhagic anemia: Secondary | ICD-10-CM | POA: Diagnosis present

## 2014-02-22 DIAGNOSIS — R1310 Dysphagia, unspecified: Secondary | ICD-10-CM | POA: Diagnosis present

## 2014-02-22 DIAGNOSIS — Z9181 History of falling: Secondary | ICD-10-CM

## 2014-02-22 DIAGNOSIS — E870 Hyperosmolality and hypernatremia: Secondary | ICD-10-CM | POA: Diagnosis present

## 2014-02-22 DIAGNOSIS — I129 Hypertensive chronic kidney disease with stage 1 through stage 4 chronic kidney disease, or unspecified chronic kidney disease: Secondary | ICD-10-CM | POA: Diagnosis present

## 2014-02-22 DIAGNOSIS — Z6828 Body mass index (BMI) 28.0-28.9, adult: Secondary | ICD-10-CM

## 2014-02-22 DIAGNOSIS — K922 Gastrointestinal hemorrhage, unspecified: Secondary | ICD-10-CM

## 2014-02-22 DIAGNOSIS — I10 Essential (primary) hypertension: Secondary | ICD-10-CM

## 2014-02-22 DIAGNOSIS — I959 Hypotension, unspecified: Secondary | ICD-10-CM | POA: Diagnosis present

## 2014-02-22 DIAGNOSIS — Z87891 Personal history of nicotine dependence: Secondary | ICD-10-CM

## 2014-02-22 DIAGNOSIS — D696 Thrombocytopenia, unspecified: Secondary | ICD-10-CM | POA: Diagnosis present

## 2014-02-22 DIAGNOSIS — I739 Peripheral vascular disease, unspecified: Secondary | ICD-10-CM | POA: Diagnosis present

## 2014-02-22 DIAGNOSIS — Z950 Presence of cardiac pacemaker: Secondary | ICD-10-CM | POA: Diagnosis present

## 2014-02-22 DIAGNOSIS — J189 Pneumonia, unspecified organism: Secondary | ICD-10-CM

## 2014-02-22 DIAGNOSIS — R627 Adult failure to thrive: Secondary | ICD-10-CM | POA: Diagnosis present

## 2014-02-22 DIAGNOSIS — R578 Other shock: Secondary | ICD-10-CM | POA: Diagnosis present

## 2014-02-22 DIAGNOSIS — N183 Chronic kidney disease, stage 3 unspecified: Secondary | ICD-10-CM | POA: Diagnosis present

## 2014-02-22 DIAGNOSIS — I498 Other specified cardiac arrhythmias: Secondary | ICD-10-CM | POA: Diagnosis present

## 2014-02-22 DIAGNOSIS — D689 Coagulation defect, unspecified: Secondary | ICD-10-CM | POA: Diagnosis present

## 2014-02-22 DIAGNOSIS — E876 Hypokalemia: Secondary | ICD-10-CM | POA: Diagnosis present

## 2014-02-22 DIAGNOSIS — N390 Urinary tract infection, site not specified: Secondary | ICD-10-CM

## 2014-02-22 DIAGNOSIS — IMO0002 Reserved for concepts with insufficient information to code with codable children: Secondary | ICD-10-CM

## 2014-02-22 DIAGNOSIS — Z7401 Bed confinement status: Secondary | ICD-10-CM

## 2014-02-22 DIAGNOSIS — J9601 Acute respiratory failure with hypoxia: Secondary | ICD-10-CM | POA: Diagnosis present

## 2014-02-22 DIAGNOSIS — N19 Unspecified kidney failure: Secondary | ICD-10-CM

## 2014-02-22 DIAGNOSIS — Z7901 Long term (current) use of anticoagulants: Secondary | ICD-10-CM

## 2014-02-22 DIAGNOSIS — M129 Arthropathy, unspecified: Secondary | ICD-10-CM | POA: Diagnosis present

## 2014-02-22 DIAGNOSIS — Z515 Encounter for palliative care: Secondary | ICD-10-CM

## 2014-02-22 DIAGNOSIS — Z9849 Cataract extraction status, unspecified eye: Secondary | ICD-10-CM

## 2014-02-22 DIAGNOSIS — K219 Gastro-esophageal reflux disease without esophagitis: Secondary | ICD-10-CM | POA: Diagnosis present

## 2014-02-22 DIAGNOSIS — Z66 Do not resuscitate: Secondary | ICD-10-CM | POA: Diagnosis not present

## 2014-02-22 DIAGNOSIS — R4182 Altered mental status, unspecified: Secondary | ICD-10-CM | POA: Diagnosis present

## 2014-02-22 DIAGNOSIS — G459 Transient cerebral ischemic attack, unspecified: Secondary | ICD-10-CM

## 2014-02-22 DIAGNOSIS — Z96659 Presence of unspecified artificial knee joint: Secondary | ICD-10-CM

## 2014-02-22 DIAGNOSIS — R5381 Other malaise: Secondary | ICD-10-CM

## 2014-02-22 DIAGNOSIS — E785 Hyperlipidemia, unspecified: Secondary | ICD-10-CM | POA: Diagnosis present

## 2014-02-22 LAB — CBC WITH DIFFERENTIAL/PLATELET
BASOS PCT: 0 % (ref 0–1)
Basophils Absolute: 0 10*3/uL (ref 0.0–0.1)
EOS PCT: 0 % (ref 0–5)
Eosinophils Absolute: 0 10*3/uL (ref 0.0–0.7)
HEMATOCRIT: 17.7 % — AB (ref 36.0–46.0)
HEMOGLOBIN: 5.9 g/dL — AB (ref 12.0–15.0)
Lymphocytes Relative: 7 % — ABNORMAL LOW (ref 12–46)
Lymphs Abs: 1 10*3/uL (ref 0.7–4.0)
MCH: 30.1 pg (ref 26.0–34.0)
MCHC: 33.3 g/dL (ref 30.0–36.0)
MCV: 90.3 fL (ref 78.0–100.0)
MONO ABS: 0.5 10*3/uL (ref 0.1–1.0)
MONOS PCT: 3 % (ref 3–12)
Neutro Abs: 14.4 10*3/uL — ABNORMAL HIGH (ref 1.7–7.7)
Neutrophils Relative %: 90 % — ABNORMAL HIGH (ref 43–77)
Platelets: 164 10*3/uL (ref 150–400)
RBC: 1.96 MIL/uL — ABNORMAL LOW (ref 3.87–5.11)
RDW: 17.6 % — ABNORMAL HIGH (ref 11.5–15.5)
WBC: 15.9 10*3/uL — ABNORMAL HIGH (ref 4.0–10.5)

## 2014-02-22 LAB — COMPREHENSIVE METABOLIC PANEL
ALBUMIN: 2.4 g/dL — AB (ref 3.5–5.2)
ALT: 14 U/L (ref 0–35)
AST: 43 U/L — ABNORMAL HIGH (ref 0–37)
Alkaline Phosphatase: 46 U/L (ref 39–117)
BILIRUBIN TOTAL: 0.4 mg/dL (ref 0.3–1.2)
BUN: 159 mg/dL — AB (ref 6–23)
CHLORIDE: 101 meq/L (ref 96–112)
CO2: 22 mEq/L (ref 19–32)
CREATININE: 4.7 mg/dL — AB (ref 0.50–1.10)
Calcium: 9.6 mg/dL (ref 8.4–10.5)
GFR calc Af Amer: 9 mL/min — ABNORMAL LOW (ref >90)
GFR calc non Af Amer: 8 mL/min — ABNORMAL LOW (ref >90)
Glucose, Bld: 103 mg/dL — ABNORMAL HIGH (ref 70–99)
Potassium: 3.9 mEq/L (ref 3.7–5.3)
Sodium: 143 mEq/L (ref 137–147)
Total Protein: 5.1 g/dL — ABNORMAL LOW (ref 6.0–8.3)

## 2014-02-22 LAB — LIPASE, BLOOD: LIPASE: 15 U/L (ref 11–59)

## 2014-02-22 LAB — URINE MICROSCOPIC-ADD ON

## 2014-02-22 LAB — URINALYSIS, ROUTINE W REFLEX MICROSCOPIC
Bilirubin Urine: NEGATIVE
Glucose, UA: NEGATIVE mg/dL
Hgb urine dipstick: NEGATIVE
KETONES UR: NEGATIVE mg/dL
NITRITE: NEGATIVE
Protein, ur: NEGATIVE mg/dL
Specific Gravity, Urine: 1.019 (ref 1.005–1.030)
Urobilinogen, UA: 0.2 mg/dL (ref 0.0–1.0)
pH: 5 (ref 5.0–8.0)

## 2014-02-22 LAB — CK: Total CK: 490 U/L — ABNORMAL HIGH (ref 7–177)

## 2014-02-22 LAB — PREPARE RBC (CROSSMATCH)

## 2014-02-22 LAB — PROTIME-INR
INR: 1.64 — ABNORMAL HIGH (ref 0.00–1.49)
Prothrombin Time: 19 seconds — ABNORMAL HIGH (ref 11.6–15.2)

## 2014-02-22 LAB — MRSA PCR SCREENING: MRSA by PCR: NEGATIVE

## 2014-02-22 LAB — POC OCCULT BLOOD, ED: FECAL OCCULT BLD: POSITIVE — AB

## 2014-02-22 LAB — I-STAT TROPONIN, ED: TROPONIN I, POC: 0.06 ng/mL (ref 0.00–0.08)

## 2014-02-22 LAB — I-STAT CG4 LACTIC ACID, ED: LACTIC ACID, VENOUS: 0.81 mmol/L (ref 0.5–2.2)

## 2014-02-22 MED ORDER — SODIUM CHLORIDE 0.9 % IV SOLN
1000.0000 mL | Freq: Once | INTRAVENOUS | Status: AC
  Administered 2014-02-22: 1000 mL via INTRAVENOUS

## 2014-02-22 MED ORDER — ACETAMINOPHEN-CODEINE #4 300-60 MG PO TABS: 1.0000 | ORAL_TABLET | Freq: Every day | ORAL | Status: DC

## 2014-02-22 MED ORDER — IPRATROPIUM BROMIDE 0.06 % NA SOLN
1.0000 | Freq: Every morning | NASAL | Status: DC
  Administered 2014-02-23 – 2014-02-27 (×4): 1 via NASAL
  Filled 2014-02-22 (×2): qty 15

## 2014-02-22 MED ORDER — DULOXETINE HCL 60 MG PO CPEP
60.0000 mg | ORAL_CAPSULE | Freq: Every morning | ORAL | Status: DC
  Administered 2014-02-23: 60 mg via ORAL
  Filled 2014-02-22: qty 1

## 2014-02-22 MED ORDER — ATORVASTATIN CALCIUM 20 MG PO TABS
20.0000 mg | ORAL_TABLET | Freq: Every day | ORAL | Status: DC
  Filled 2014-02-22: qty 1

## 2014-02-22 MED ORDER — FLUTICASONE PROPIONATE 50 MCG/ACT NA SUSP
1.0000 | Freq: Every day | NASAL | Status: DC
  Administered 2014-02-22 – 2014-02-27 (×5): 1 via NASAL
  Filled 2014-02-22 (×3): qty 16

## 2014-02-22 MED ORDER — DM-GUAIFENESIN ER 30-600 MG PO TB12
2.0000 | ORAL_TABLET | Freq: Every day | ORAL | Status: DC
  Administered 2014-02-23: 2 via ORAL
  Filled 2014-02-22: qty 2

## 2014-02-22 MED ORDER — LEVOTHYROXINE SODIUM 25 MCG PO TABS
25.0000 ug | ORAL_TABLET | Freq: Every day | ORAL | Status: DC
  Administered 2014-02-23: 25 ug via ORAL
  Filled 2014-02-22 (×2): qty 1

## 2014-02-22 MED ORDER — BUPROPION HCL ER (XL) 150 MG PO TB24
150.0000 mg | ORAL_TABLET | Freq: Every day | ORAL | Status: DC
  Administered 2014-02-23: 150 mg via ORAL
  Filled 2014-02-22: qty 1

## 2014-02-22 MED ORDER — SODIUM CHLORIDE 0.9 % IV SOLN
INTRAVENOUS | Status: DC
  Administered 2014-02-23: via INTRAVENOUS

## 2014-02-22 MED ORDER — ALLOPURINOL 100 MG PO TABS
100.0000 mg | ORAL_TABLET | Freq: Two times a day (BID) | ORAL | Status: DC
  Administered 2014-02-23: 100 mg via ORAL
  Filled 2014-02-22 (×2): qty 1

## 2014-02-22 MED ORDER — SODIUM CHLORIDE 0.9 % IV BOLUS (SEPSIS)
500.0000 mL | Freq: Once | INTRAVENOUS | Status: AC
  Administered 2014-02-22: 500 mL via INTRAVENOUS

## 2014-02-22 MED ORDER — ADULT MULTIVITAMIN W/MINERALS CH
1.0000 | ORAL_TABLET | Freq: Every morning | ORAL | Status: DC
  Administered 2014-02-23: 1 via ORAL
  Filled 2014-02-22: qty 1

## 2014-02-22 MED ORDER — FESOTERODINE FUMARATE ER 8 MG PO TB24
8.0000 mg | ORAL_TABLET | Freq: Every day | ORAL | Status: DC
  Administered 2014-02-23: 8 mg via ORAL
  Filled 2014-02-22: qty 1

## 2014-02-22 MED ORDER — SODIUM CHLORIDE 0.9 % IV SOLN: 1000.0000 mL | INTRAVENOUS | Status: DC

## 2014-02-22 MED ORDER — ACETAMINOPHEN-CODEINE #3 300-30 MG PO TABS
1.0000 | ORAL_TABLET | Freq: Every day | ORAL | Status: DC
  Administered 2014-02-24 – 2014-02-26 (×2): 1 via ORAL
  Filled 2014-02-22 (×4): qty 1

## 2014-02-22 MED ORDER — CODEINE SULFATE 15 MG PO TABS
30.0000 mg | ORAL_TABLET | Freq: Every day | ORAL | Status: DC
  Administered 2014-02-26: 30 mg via ORAL
  Filled 2014-02-22 (×2): qty 2

## 2014-02-22 MED ORDER — CALCITRIOL 0.25 MCG PO CAPS
0.2500 ug | ORAL_CAPSULE | Freq: Every morning | ORAL | Status: DC
  Filled 2014-02-22 (×2): qty 1

## 2014-02-22 MED ORDER — ONDANSETRON HCL 4 MG PO TABS: 4.0000 mg | ORAL_TABLET | Freq: Four times a day (QID) | ORAL | Status: DC | PRN

## 2014-02-22 MED ORDER — DIAZEPAM 5 MG PO TABS: 10.0000 mg | ORAL_TABLET | Freq: Every day | ORAL | Status: DC

## 2014-02-22 MED ORDER — ONDANSETRON HCL 4 MG/2ML IJ SOLN: 4.0000 mg | Freq: Four times a day (QID) | INTRAMUSCULAR | Status: DC | PRN

## 2014-02-22 MED ORDER — PANTOPRAZOLE SODIUM 40 MG IV SOLR
40.0000 mg | Freq: Two times a day (BID) | INTRAVENOUS | Status: DC
  Filled 2014-02-22: qty 40

## 2014-02-22 NOTE — ED Notes (Signed)
Unsuccessful attempt at foley insertion x1.

## 2014-02-22 NOTE — ED Notes (Signed)
Activated a Level 1 Code Sepsis

## 2014-02-22 NOTE — ED Notes (Signed)
MD at bedside. 

## 2014-02-22 NOTE — ED Notes (Signed)
MD made aware of critical hemoglobin 

## 2014-02-22 NOTE — ED Notes (Signed)
Pt has been unusually weak for the last 2 days. She has had no PO intake for the past 2 days except some water and part of a Boost supplement yesterday.Pt lives with her son.  Pt has h/o stroke and AFib. Pt is very lethargic on arrival. Pt was having trouble breathing and was also incoherent. Son states that she pt has recently been experiencing hand tremors X 1 week Pt has been c/o all over pain at home. Pt in no apparent distress.

## 2014-02-22 NOTE — ED Provider Notes (Signed)
CSN: [id number]     Arrival date & time 02/22/14  1256 History   First MD Initiated Contact with Patient 02/22/14 1312     Chief Complaint  Patient presents with  . Weakness     (Consider location/radiation/quality/duration/timing/severity/associated sxs/prior Treatment) HPI 80 year old female lives with her son; at baseline patient has generalized weakness; at baseline patient is bedridden but able to help stand for transfer from bed to commode; within the last 2-3 weeks the patient is weaker than usual also did have a cough which is now much improved and almost gone she did have pain and swelling to right hand and elbow which is now gone after taking steroids for her doctor antibiotics from her Dr.; The last few days the patient has gradual onset worse than baseline generalized weakness with new onset confusion with little oral intake over the last few days with no rash no vomiting no diarrhea no shortness of breath the patient apparently had generalized pain yesterday but is an unreliable historian today and she intermittently follows simple commands today which is not baseline for her. No treatment prior to arrival. Patient is on Eliquis for chronic atrial fibrillation and has a pacemaker. Past Medical History  Diagnosis Date  . Hypertension   . Hyperlipidemia   . GERD (gastroesophageal reflux disease)   . Dysphagia     appt with Molly Maduro Buccini upcoming  . Chronic recurrent sinusitis   . Recurrent UTI   . Atrial fibrillation     ???  . Arthritis   . TIA (transient ischemic attack)   . Anemia   . Hypothyroidism    Past Surgical History  Procedure Laterality Date  . Pacemaker insertion    . Back surgery  1985  . Shoulder surgery  2008    Left  (Dr. Ranell Patrick)  . Total knee arthroplasty  2000, 2001    bilateral (Dr. Despina Hick), left 2000, right 2001  . Cataract extraction  1986, 2003    left 1986, right 2003  . Vaginal hysterectomy  1969  . Bilateral salpingoophorectomy  1971  .  Tonsillectomy  1951  . Appendectomy  1952  . Mastoidectomy  1970  . Sinus exploration  1972  . Knee arthroscopy  1988, 1995, 2000  . Hemorrhoid surgery  2009  . Repair / reconstruction interphalangeal joint  2004    R thumb  . Cystoscopy  1992  . Insert / replace / remove pacemaker     Family History  Problem Relation Age of Onset  . Stroke Mother   . Heart attack Father    History  Substance Use Topics  . Smoking status: Former Smoker -- 1.00 packs/day for 15 years    Types: Cigarettes    Quit date: 12/04/1981  . Smokeless tobacco: Never Used  . Alcohol Use: No   OB History   Grav Para Term Preterm Abortions TAB SAB Ect Mult Living                 Review of Systems  10 Systems reviewed and are negative for acute change except as noted in the HPI.  Allergies  Review of patient's allergies indicates no known allergies.  Home Medications   No current outpatient prescriptions on file. BP 94/47  Pulse 66  Temp(Src) 97.5 F (36.4 C) (Oral)  Resp 17  Ht 5\' 5"  (1.651 m)  Wt 166 lb 10.7 oz (75.6 kg)  BMI 27.73 kg/m2  SpO2 95% Physical Exam  Nursing note and vitals reviewed. Constitutional:  Awake, alert, nontoxic appearance.  HENT:  Head: Atraumatic.  Eyes: Conjunctivae and EOM are normal. Right eye exhibits no discharge. Left eye exhibits no discharge.  Neck: Neck supple.  Cardiovascular: Normal rate and regular rhythm.   No murmur heard. Pulmonary/Chest: Effort normal. No respiratory distress. She has no wheezes. She has rales. She exhibits no tenderness.  Crackles at bases bilaterally; hypoxic room air at 85% pulse oximetry improved to 90s with nasal oxygen  Abdominal: Soft. Bowel sounds are normal. She exhibits no distension. There is no tenderness. There is no rebound.  Musculoskeletal: She exhibits no edema and no tenderness.  Baseline ROM, no obvious new focal weakness.  Neurological: She is alert.  Patient oriented to self only not to time or to place  this is abnormal for patient she has generalized weakness 3/5 in all 4 extremities; son states this is weaker than her baseline which is usually 4/5  Skin: No rash noted.  Psychiatric: She has a normal mood and affect.    ED Course  Procedures (including critical care time) Patient / Family / Caregiver understand and agree with initial ED impression and plan with expectations set for ED visit. SBP improved with IVF bolus for hypotension. Transfused for critical anemia Hgb<7.  Chaperone present for rectal examination which reveals light brown stool which is Hemoccult positive.  CT/labs d/w Pt and family. Pt feels improved after observation and/or treatment in ED. Pt and family uncertain of Pt's code status, Pt and family considering DNR/DNI vs full code status. [facility] paged. 1755  D/w [facility] for admit; will see Pt in ED. 1815  Patient / Family / Caregiver informed of clinical course, understand medical decision-making process, and agree with plan.  CRITICAL CARE Performed by: Hurman Horn Total critical care time: Critical care time was exclusive of separately billable procedures and treating other patients. Critical care was necessary to treat or prevent imminent or life-threatening deterioration. Critical care was time spent personally by me on the following activities: development of treatment plan with patient and/or surrogate as well as nursing, discussions with consultants, evaluation of patient's response to treatment, examination of patient, obtaining history from patient or surrogate, ordering and performing treatments and interventions, ordering and review of laboratory studies, ordering and review of radiographic studies, pulse oximetry and re-evaluation of patient's condition.  Labs Review Labs Reviewed  CBC WITH DIFFERENTIAL - Abnormal; Notable for the following:    WBC 15.9 (*)    RBC 1.96 (*)    Hemoglobin 5.9 (*)    HCT 17.7 (*)    RDW 17.6 (*)    Neutrophils  Relative % 90 (*)    Neutro Abs 14.4 (*)    Lymphocytes Relative 7 (*)    All other components within normal limits  COMPREHENSIVE METABOLIC PANEL - Abnormal; Notable for the following:    Glucose, Bld 103 (*)    BUN 159 (*)    Creatinine, Ser 4.70 (*)    Total Protein 5.1 (*)    Albumin 2.4 (*)    AST 43 (*)    GFR calc non Af Amer 8 (*)    GFR calc Af Amer 9 (*)    All other components within normal limits  URINALYSIS, ROUTINE W REFLEX MICROSCOPIC - Abnormal; Notable for the following:    APPearance CLOUDY (*)    Leukocytes, UA MODERATE (*)    All other components within normal limits  PROTIME-INR - Abnormal; Notable for the following:    Prothrombin Time 19.0 (*)  INR 1.64 (*)    All other components within normal limits  URINE MICROSCOPIC-ADD ON - Abnormal; Notable for the following:    Squamous Epithelial / LPF MANY (*)    All other components within normal limits  CK - Abnormal; Notable for the following:    Total CK 490 (*)    All other components within normal limits  BASIC METABOLIC PANEL - Abnormal; Notable for the following:    Sodium 148 (*)    Potassium 3.4 (*)    Glucose, Bld 101 (*)    BUN 139 (*)    Creatinine, Ser 3.94 (*)    GFR calc non Af Amer 10 (*)    GFR calc Af Amer 11 (*)    All other components within normal limits  CBC - Abnormal; Notable for the following:    WBC 14.0 (*)    RBC 2.83 (*)    Hemoglobin 8.1 (*)    HCT 24.5 (*)    RDW 17.9 (*)    Platelets 129 (*)    All other components within normal limits  CBC - Abnormal; Notable for the following:    WBC 11.6 (*)    RBC 2.82 (*)    Hemoglobin 8.2 (*)    HCT 24.5 (*)    RDW 19.4 (*)    Platelets 120 (*)    All other components within normal limits  BLOOD GAS, ARTERIAL - Abnormal; Notable for the following:    pH, Arterial 7.319 (*)    pO2, Arterial 57.5 (*)    Acid-base deficit 3.4 (*)    All other components within normal limits  HEPARIN LEVEL (UNFRACTIONATED) - Abnormal;  Notable for the following:    Heparin Unfractionated >2.20 (*)    All other components within normal limits  BASIC METABOLIC PANEL - Abnormal; Notable for the following:    Sodium 151 (*)    Potassium 3.1 (*)    Chloride 115 (*)    BUN 128 (*)    Creatinine, Ser 3.38 (*)    GFR calc non Af Amer 12 (*)    GFR calc Af Amer 14 (*)    All other components within normal limits  POC OCCULT BLOOD, ED - Abnormal; Notable for the following:    Fecal Occult Bld POSITIVE (*)    All other components within normal limits  CULTURE, BLOOD (ROUTINE X 2)  CULTURE, BLOOD (ROUTINE X 2)  URINE CULTURE  MRSA PCR SCREENING  LIPASE, BLOOD  SODIUM, URINE, RANDOM  COMPREHENSIVE METABOLIC PANEL  HEPARIN LEVEL (UNFRACTIONATED)  CBC  I-STAT CG4 LACTIC ACID, ED  I-STAT TROPOININ, ED  PREPARE RBC (CROSSMATCH)  TYPE AND SCREEN   Imaging Review Ct Abdomen Pelvis Wo Contrast  02/22/2014   CLINICAL DATA:  80 year old female with weakness, low hemoglobin and abdominal and pelvic pain.  EXAM: CT ABDOMEN AND PELVIS WITHOUT CONTRAST  TECHNIQUE: Multidetector CT imaging of the abdomen and pelvis was performed following the standard protocol without intravenous contrast.  COMPARISON:  06/12/2012 and prior CTs  FINDINGS: Coronary artery calcifications and mild bibasilar atelectasis/ scarring again noted.  Hepatic cyst is present.  The spleen, pancreas and adrenal glands are unremarkable.  Mild pericholecystic stranding is noted.  Bilateral renal atrophy is identified with right renal cysts. A 1.6 cm indeterminate hyperdense lesion within left kidney is again noted.  Please note that parenchymal abnormalities may be missed without intravenous contrast.  Patient status post hysterectomy.  Colonic diverticulosis without evidence of diverticulitis.  There is  no evidence of bowel obstruction, pneumoperitoneum or definite abscess.  The bladder is unremarkable.  A 1.3 x 1.6 cm hyperdense area along the lateral left pectineus muscle  could represent a small hematoma.  No acute or suspicious bony abnormalities are identified. A severe lumbar scoliosis degenerative changes are noted.  IMPRESSION: Mild pericholecystic stranding of uncertain significance. Acute cholecystitis is not excluded and consider ultrasound evaluation as clinically indicated.  1.3 x 1.6 cm possible hematoma along the left pectineus muscle.  Bilateral renal atrophy and indeterminate 1.6 cm left renal lesion noted. Consider initial evaluation with ultrasound if patient has renal insufficiency.   Electronically Signed   By: Laveda Abbe M.D.   On: 02/22/2014 17:35   Ct Head Wo Contrast  02/22/2014   CLINICAL DATA:  80 year old female with weakness and lethargy  EXAM: CT HEAD WITHOUT CONTRAST  TECHNIQUE: Contiguous axial images were obtained from the base of the skull through the vertex without intravenous contrast.  COMPARISON:  10/08/2013 and prior CTs  FINDINGS: Atrophy and chronic small-vessel white matter ischemic changes are again identified.  No acute intracranial abnormalities are identified, including mass lesion or mass effect, hydrocephalus, extra-axial fluid collection, midline shift, hemorrhage, or acute infarction. The visualized bony calvarium is unremarkable  Surgical chronic changes of the right maxillary sinus are noted.  IMPRESSION: No evidence of acute intracranial abnormality.  Atrophy and chronic small-vessel white matter ischemic changes.   Electronically Signed   By: Laveda Abbe M.D.   On: 02/22/2014 16:34   Dg Chest Port 1 View  02/22/2014   CLINICAL DATA:  Weakness, hypertension, former smoker, history pacemaker, atrial fibrillation  EXAM: PORTABLE CHEST - 1 VIEW  COMPARISON:  Portable exam 1344 hr compared to 10/08/2013  FINDINGS: Right subclavian sequential transvenous pacemaker leads project over right atrium and right ventricle.  Enlargement of cardiac silhouette with pulmonary vascular congestion.  Stable mediastinal contours.  Chronic elevation of  right diaphragm.  No gross infiltrate, pleural effusion or pneumothorax.  Bones demineralized.  : Enlargement of cardiac silhouette post pacemaker.  No definite acute abnormalities.   Electronically Signed   By: Ulyses Southward M.D.   On: 02/22/2014 14:14     EKG Interpretation   Date/Time:  Sunday February 22 2014 13:20:57 EDT Ventricular Rate:  89 PR Interval:  177 QRS Duration: 97 QT Interval:  368 QTC Calculation: 448 R Axis:   -24 Text Interpretation:  Sinus rhythm Artifact Aberrant conduction of SV  complex(es) Inferior infarct, old Compared to previous tracing  atrial-paced complexes NO LONGER PRESENT Confirmed by Fonnie Jarvis  MD, Jonny Ruiz  [phone]) on 02/22/2014 4:57:45 PM      MDM   Final diagnoses:  Renal failure  Anemia  Altered mental status  GI bleed  Coagulopathy  Hypotension  The patient appears reasonably stabilized for admission considering the current resources, flow, and capabilities available in the ED at this time, and I doubt any other [facility] requiring further screening and/or treatment in the ED prior to admission.    Hurman Horn, MD 02/23/14 [phone]

## 2014-02-22 NOTE — ED Notes (Signed)
NS started at 13425mL/hr. Epic now allowing order to be completed.

## 2014-02-22 NOTE — H&P (Signed)
.  [facility] History and Physical  Victory Dakineggie [name] RUE:[id number] DOB: 05/14/1931 DOA: 02/22/2014  Referring physician: Wayland SalinasJohn Bednar, MD PCP: Johny BlamerHARRIS, WILLIAM, MD   Chief Complaint: Generalized Weakness  HPI: Bridget Donnal DebarR Whitehead is a 80 y.o. female with multiple medical problems brought to the ER by her son for evaluation. He son is the primary caregiver and she resides with him. Most of the history is taken from the son as the patient is slow to respond to questioning. Apparently she had been seen by her PCP recently for a bronchitis. She was given some prednisone as a taper schedule and was also given some antibiotics. The son states that he usually helps her to move about and she is able to assist with lifting a little bit however recently she has gotten even more weak and was not able to assist to the point of falling. She had also been seen in December for falls.  She has been getting worse now over the last few days. When she came in to the ER she was noted to be HYPOtensive and was given fluids which she responded with some improvement in her blood pressure. Her labs revealed that she had a hemoglobin of 5.9 (last hemoglobin on file was 10 in September 2014) and her Creatinine has also gone up to 4.7 from a baseline of around 1.1 for which she has been seen by Nephrology in the past. Currently patient is somewhat slow to respond to questioning.   Review of Systems:  Constitutional:  No weight loss, night sweats, Fevers, chills, +fatigue.  HEENT:  No headaches, Sore throat, no nosebleeds Cardio-vascular:  No chest pain, anasarca, +dizziness, no palpitations  GI:  No heartburn, +abdominal pain, no nausea, vomiting, +loss of appetite +dark stools Resp:  No shortness of breath at rest. Not able to exert. +cough dry in nature improving, No coughing up of blood. No chest wall deformity  Skin:  Abrasion noted on the Right shin GU:  no dysuria, change in color of urine, +decreased  frequency No flank pain.  Musculoskeletal:  +chronic musculoskeletal pain for which she takes Tylenol #4.  Psych:  Generally lethargic weak. Affect is slow but appropriate   Past Medical History  Diagnosis Date  . Hypertension   . Hyperlipidemia   . GERD (gastroesophageal reflux disease)   . Dysphagia     appt with Molly Maduroobert Buccini upcoming  . Chronic recurrent sinusitis   . Recurrent UTI   . Atrial fibrillation     ???  . Arthritis   . TIA (transient ischemic attack)   . Anemia   . Hypothyroidism    Past Surgical History  Procedure Laterality Date  . Pacemaker insertion    . Back surgery  1985  . Shoulder surgery  2008    Left  (Dr. Ranell PatrickNorris)  . Total knee arthroplasty  2000, 2001    bilateral (Dr. Despina HickAlusio), left 2000, right 2001  . Cataract extraction  1986, 2003    left 1986, right 2003  . Vaginal hysterectomy  1969  . Bilateral salpingoophorectomy  1971  . Tonsillectomy  1951  . Appendectomy  1952  . Mastoidectomy  1970  . Sinus exploration  1972  . Knee arthroscopy  1988, 1995, 2000  . Hemorrhoid surgery  2009  . Repair / reconstruction interphalangeal joint  2004    Bridget thumb  . Cystoscopy  1992  . Insert / replace / remove pacemaker     Social History:  reports that she  quit smoking about 32 years ago. Her smoking use included Cigarettes. She has a 15 pack-year smoking history. She has never used smokeless tobacco. She reports that she does not drink alcohol or use illicit drugs.  No Known Allergies  Family History  Problem Relation Age of Onset  . Stroke Mother   . Heart attack Father      Prior to Admission medications   Medication Sig Start Date End Date Taking? Authorizing Provider  acetaminophen-codeine (TYLENOL #4) 300-60 MG per tablet Take 1 tablet by mouth at bedtime.   Yes Historical Provider, MD  allopurinol (ZYLOPRIM) 100 MG tablet Take 1 tablet (100 mg total) by mouth 2 (two) times daily. 03/04/13  Yes Daniel J Angiulli, PA-C  amLODipine (NORVASC)  10 MG tablet Take 1 tablet (10 mg total) by mouth daily. 09/16/13  Yes Everette Rank, MD  apixaban (ELIQUIS) 2.5 MG TABS tablet Take 5 mg by mouth daily.   Yes Historical Provider, MD  atorvastatin (LIPITOR) 20 MG tablet Take 20 mg by mouth at bedtime.   Yes Historical Provider, MD  buPROPion (WELLBUTRIN XL) 150 MG 24 hr tablet Take 150 mg by mouth daily.  08/22/13  Yes Historical Provider, MD  calcitRIOL (ROCALTROL) 0.25 MCG capsule Take 1 capsule (0.25 mcg total) by mouth every morning. 03/04/13  Yes Daniel J Angiulli, PA-C  carvedilol (COREG) 3.125 MG tablet Take 3.125 mg by mouth 2 (two) times daily with a meal.   Yes Historical Provider, MD  dextromethorphan-guaiFENesin (MUCINEX DM) 30-600 MG per 12 hr tablet Take 2 tablets by mouth daily.   Yes Historical Provider, MD  diazepam (VALIUM) 10 MG tablet Take 10 mg by mouth at bedtime.    Yes Historical Provider, MD  DULoxetine (CYMBALTA) 60 MG capsule Take 1 capsule (60 mg total) by mouth every morning. 03/04/13  Yes Daniel J Angiulli, PA-C  furosemide (LASIX) 40 MG tablet Take 1 tablet by mouth daily. 10/07/13  Yes Historical Provider, MD  hydrALAZINE (APRESOLINE) 25 MG tablet Take 25 mg by mouth 2 (two) times daily. 09/30/13  Yes Everette Rank, MD  ipratropium (ATROVENT) 0.06 % nasal spray Place 1 spray into the nose every morning.    Yes Historical Provider, MD  levothyroxine (SYNTHROID, LEVOTHROID) 25 MCG tablet Take 1 tablet (25 mcg total) by mouth every morning. 03/04/13  Yes Daniel J Angiulli, PA-C  losartan (COZAAR) 50 MG tablet Take 50 mg by mouth 2 (two) times daily.   Yes Historical Provider, MD  Multiple Vitamin (MULTIVITAMIN WITH MINERALS) TABS Take 1 tablet by mouth every morning.    Yes Historical Provider, MD  pantoprazole (PROTONIX) 40 MG tablet Take 80 mg by mouth 2 (two) times daily.    Yes Historical Provider, MD  predniSONE (DELTASONE) 10 MG tablet Take 20 mg by mouth daily with breakfast.   Yes Historical Provider, MD  sotalol  (BETAPACE) 120 MG tablet Take 1 tablet (120 mg total) by mouth 2 (two) times daily. 03/04/13  Yes Daniel J Angiulli, PA-C  tiZANidine (ZANAFLEX) 2 MG tablet Take 2 mg by mouth at bedtime.  08/13/13  Yes Historical Provider, MD  tolterodine (DETROL LA) 4 MG 24 hr capsule Take 4 mg by mouth daily.   Yes Historical Provider, MD  triamcinolone (NASACORT) 55 MCG/ACT nasal inhaler Place 1 spray into the nose daily.   Yes Historical Provider, MD   Physical Exam: Filed Vitals:   02/22/14 1905  BP: 104/41  Pulse: 65  Temp: 97.7 F (36.5 C)  Resp: 18  BP 104/41  Pulse 65  Temp(Src) 97.7 F (36.5 C) (Oral)  Resp 18  Ht 5\' 6"  (1.676 m)  Wt 67.586 kg (149 lb)  BMI 24.06 kg/m2  SpO2 95%  General:  Appears calm and comfortable NAD Eyes: PERRL, normal lids, irises & conjunctiva ENT: grossly normal hearing, lips & tongue mouth is dry Neck: no LAD, masses or thyromegaly Cardiovascular: IRR monitor shows pacing, no m/Bridget/g. No LE edema. Telemetry: no malignant arrhythmias  Respiratory: CTA bilaterally, no w/Bridget/Bridget. Normal respiratory effort. Abdomen: soft, has more or less diffuse tenderness but seems more prominent in the RLQ LLQ Skin: no rash or induration seen on limited exam Musculoskeletal: grossly normal tone BUE/BLE Psychiatric: appears oriented but slow to respond Neurologic: grossly non-focal.moves all four extremities gait was not assessed          Labs on Admission:  Basic Metabolic Panel:  Recent Labs Lab 02/22/14 1350  NA 143  K 3.9  CL 101  CO2 22  GLUCOSE 103*  BUN 159*  CREATININE 4.70*  CALCIUM 9.6   Liver Function Tests:  Recent Labs Lab 02/22/14 1350  AST 43*  ALT 14  ALKPHOS 46  BILITOT 0.4  PROT 5.1*  ALBUMIN 2.4*    Recent Labs Lab 02/22/14 1350  LIPASE 15   No results found for this basename: AMMONIA,  in the last 168 hours CBC:  Recent Labs Lab 02/22/14 1350  WBC 15.9*  NEUTROABS 14.4*  HGB 5.9*  HCT 17.7*  MCV 90.3  PLT 164    Cardiac Enzymes:  Recent Labs Lab 02/22/14 1708  CKTOTAL 490*    BNP (last 3 results) No results found for this basename: PROBNP,  in the last 8760 hours CBG: No results found for this basename: GLUCAP,  in the last 168 hours  Radiological Exams on Admission: Ct Abdomen Pelvis Wo Contrast  02/22/2014   CLINICAL DATA:  80 year old female with weakness, low hemoglobin and abdominal and pelvic pain.  EXAM: CT ABDOMEN AND PELVIS WITHOUT CONTRAST  TECHNIQUE: Multidetector CT imaging of the abdomen and pelvis was performed following the standard protocol without intravenous contrast.  COMPARISON:  06/12/2012 and prior CTs  FINDINGS: Coronary artery calcifications and mild bibasilar atelectasis/ scarring again noted.  Hepatic cyst is present.  The spleen, pancreas and adrenal glands are unremarkable.  Mild pericholecystic stranding is noted.  Bilateral renal atrophy is identified with right renal cysts. A 1.6 cm indeterminate hyperdense lesion within left kidney is again noted.  Please note that parenchymal abnormalities may be missed without intravenous contrast.  Patient status post hysterectomy.  Colonic diverticulosis without evidence of diverticulitis.  There is no evidence of bowel obstruction, pneumoperitoneum or definite abscess.  The bladder is unremarkable.  A 1.3 x 1.6 cm hyperdense area along the lateral left pectineus muscle could represent a small hematoma.  No acute or suspicious bony abnormalities are identified. A severe lumbar scoliosis degenerative changes are noted.  IMPRESSION: Mild pericholecystic stranding of uncertain significance. Acute cholecystitis is not excluded and consider ultrasound evaluation as clinically indicated.  1.3 x 1.6 cm possible hematoma along the left pectineus muscle.  Bilateral renal atrophy and indeterminate 1.6 cm left renal lesion noted. Consider initial evaluation with ultrasound if patient has renal insufficiency.   Electronically Signed   By: Laveda Abbe  M.D.   On: 02/22/2014 17:35   Ct Head Wo Contrast  02/22/2014   CLINICAL DATA:  80 year old female with weakness and lethargy  EXAM: CT HEAD WITHOUT CONTRAST  TECHNIQUE:  Contiguous axial images were obtained from the base of the skull through the vertex without intravenous contrast.  COMPARISON:  10/08/2013 and prior CTs  FINDINGS: Atrophy and chronic small-vessel white matter ischemic changes are again identified.  No acute intracranial abnormalities are identified, including mass lesion or mass effect, hydrocephalus, extra-axial fluid collection, midline shift, hemorrhage, or acute infarction. The visualized bony calvarium is unremarkable  Surgical chronic changes of the right maxillary sinus are noted.  IMPRESSION: No evidence of acute intracranial abnormality.  Atrophy and chronic small-vessel white matter ischemic changes.   Electronically Signed   By: Laveda Abbe M.D.   On: 02/22/2014 16:34   Dg Chest Port 1 View  02/22/2014   CLINICAL DATA:  Weakness, hypertension, former smoker, history pacemaker, atrial fibrillation  EXAM: PORTABLE CHEST - 1 VIEW  COMPARISON:  Portable exam 1344 hr compared to 10/08/2013  FINDINGS: Right subclavian sequential transvenous pacemaker leads project over right atrium and right ventricle.  Enlargement of cardiac silhouette with pulmonary vascular congestion.  Stable mediastinal contours.  Chronic elevation of right diaphragm.  No gross infiltrate, pleural effusion or pneumothorax.  Bones demineralized.  : Enlargement of cardiac silhouette post pacemaker.  No definite acute abnormalities.   Electronically Signed   By: Ulyses Southward M.D.   On: 02/22/2014 14:14     Assessment/Plan Principal Problem:   Hypotension Active Problems:   Anemia   Altered mental state   Atrial fibrillation   Pacemaker   AKI (acute kidney injury)   1. HYPOtension -related to dehydration as well as anemia with hemoglobin of 5.9 -will give fluid resuscitation -will also be transfused blood  due to severe anemia -If she deteriorates or does not respond to appropriate fluid therapy may need pressors  2. Anemia -may be due to combination of prednisone and also apixaban -will hold apixaban and NSAIDS, No aspirin -protonix IV 40mg  q12h -with her hemoglobin of 5.9 she will be transfused -will get a GI evaluation in am (to be called) -repeat CBC in am -will get iron studies  3. AMS -likely related to low blood pressure and anemia -CT scan was reviewed shows chronic changes of small vessel disease -Appears to be improving though  4. Atrial Fibrillation -rate controlled -hold eloquis -will continue telemetry  5. Pacemaker status -currently appears to be functioning  6. Renal failure -worsened from baseline -fluid resuscitation -will get nephrology consult in am  7. DVT prophylaxis -no anticoagulation -will use SCD    Code Status: Full Code (must indicate code status--if unknown or must be presumed, indicate so) Family Communication: Son at the Bedside(indicate person spoken with, if applicable, with phone number if by telephone) Disposition Plan: LOS 3-4 days (indicate anticipated LOS)  Time spent: 60 minutes  Tiffony Kite A [facility]

## 2014-02-22 NOTE — ED Notes (Signed)
nss bolus infused

## 2014-02-22 NOTE — ED Notes (Signed)
In and out attempt unsuccessful

## 2014-02-22 NOTE — ED Notes (Signed)
Dr. Bednar at bedside. 

## 2014-02-22 NOTE — ED Notes (Signed)
Pt denies any complaints after blood administration.

## 2014-02-22 NOTE — ED Notes (Signed)
Went to get pt for CT scan.  Told by RN, pt unable to come, that blood pressure was low upon arrival to ED and RN has not had a chance to reassess pt.  RN was in another room with a Code Stroke pt.  Will call us when pt is ready for CT scan.

## 2014-02-22 NOTE — ED Notes (Signed)
Pt transported to CT ?

## 2014-02-23 DIAGNOSIS — J9601 Acute respiratory failure with hypoxia: Secondary | ICD-10-CM | POA: Diagnosis present

## 2014-02-23 DIAGNOSIS — D696 Thrombocytopenia, unspecified: Secondary | ICD-10-CM | POA: Diagnosis present

## 2014-02-23 DIAGNOSIS — R195 Other fecal abnormalities: Secondary | ICD-10-CM | POA: Diagnosis present

## 2014-02-23 DIAGNOSIS — E870 Hyperosmolality and hypernatremia: Secondary | ICD-10-CM | POA: Diagnosis present

## 2014-02-23 DIAGNOSIS — N183 Chronic kidney disease, stage 3 unspecified: Secondary | ICD-10-CM

## 2014-02-23 DIAGNOSIS — I5032 Chronic diastolic (congestive) heart failure: Secondary | ICD-10-CM | POA: Diagnosis present

## 2014-02-23 DIAGNOSIS — N179 Acute kidney failure, unspecified: Secondary | ICD-10-CM

## 2014-02-23 LAB — BASIC METABOLIC PANEL
BUN: 139 mg/dL — ABNORMAL HIGH (ref 6–23)
BUN: 128 mg/dL — AB (ref 6–23)
CALCIUM: 8.8 mg/dL (ref 8.4–10.5)
CHLORIDE: 115 meq/L — AB (ref 96–112)
CO2: 20 meq/L (ref 19–32)
CO2: 21 meq/L (ref 19–32)
Calcium: 8.5 mg/dL (ref 8.4–10.5)
Chloride: 110 mEq/L (ref 96–112)
Creatinine, Ser: 3.94 mg/dL — ABNORMAL HIGH (ref 0.50–1.10)
Creatinine, Ser: 3.38 mg/dL — ABNORMAL HIGH (ref 0.50–1.10)
GFR calc Af Amer: 11 mL/min — ABNORMAL LOW (ref >90)
GFR calc Af Amer: 14 mL/min — ABNORMAL LOW (ref >90)
GFR calc non Af Amer: 10 mL/min — ABNORMAL LOW (ref >90)
GFR calc non Af Amer: 12 mL/min — ABNORMAL LOW (ref >90)
GLUCOSE: 90 mg/dL (ref 70–99)
Glucose, Bld: 101 mg/dL — ABNORMAL HIGH (ref 70–99)
POTASSIUM: 3.1 meq/L — AB (ref 3.7–5.3)
Potassium: 3.4 mEq/L — ABNORMAL LOW (ref 3.7–5.3)
SODIUM: 148 meq/L — AB (ref 137–147)
Sodium: 151 mEq/L — ABNORMAL HIGH (ref 137–147)

## 2014-02-23 LAB — CBC
HCT: 24.5 % — ABNORMAL LOW (ref 36.0–46.0)
HEMATOCRIT: 24.5 % — AB (ref 36.0–46.0)
Hemoglobin: 8.1 g/dL — ABNORMAL LOW (ref 12.0–15.0)
Hemoglobin: 8.2 g/dL — ABNORMAL LOW (ref 12.0–15.0)
MCH: 28.6 pg (ref 26.0–34.0)
MCH: 29.1 pg (ref 26.0–34.0)
MCHC: 33.1 g/dL (ref 30.0–36.0)
MCHC: 33.5 g/dL (ref 30.0–36.0)
MCV: 86.6 fL (ref 78.0–100.0)
MCV: 86.9 fL (ref 78.0–100.0)
PLATELETS: 120 10*3/uL — AB (ref 150–400)
Platelets: 129 10*3/uL — ABNORMAL LOW (ref 150–400)
RBC: 2.83 MIL/uL — AB (ref 3.87–5.11)
RBC: 2.82 MIL/uL — ABNORMAL LOW (ref 3.87–5.11)
RDW: 17.9 % — ABNORMAL HIGH (ref 11.5–15.5)
RDW: 19.4 % — AB (ref 11.5–15.5)
WBC: 14 10*3/uL — ABNORMAL HIGH (ref 4.0–10.5)
WBC: 11.6 10*3/uL — AB (ref 4.0–10.5)

## 2014-02-23 LAB — TYPE AND SCREEN
ABO/RH(D): O POS
Antibody Screen: NEGATIVE
UNIT DIVISION: 0
Unit division: 0

## 2014-02-23 LAB — SODIUM, URINE, RANDOM: Sodium, Ur: 28 mEq/L

## 2014-02-23 LAB — HEPARIN LEVEL (UNFRACTIONATED): Heparin Unfractionated: >2.2 IU/mL — ABNORMAL HIGH (ref 0.30–0.70)

## 2014-02-23 MED ORDER — SODIUM CHLORIDE 0.45 % IV SOLN
INTRAVENOUS | Status: DC
Start: 2014-02-23 — End: 2014-02-24
  Administered 2014-02-23 – 2014-02-24 (×2): via INTRAVENOUS

## 2014-02-23 MED ORDER — LEVOTHYROXINE SODIUM 100 MCG IV SOLR
12.5000 ug | Freq: Every day | INTRAVENOUS | Status: DC
  Administered 2014-02-24 – 2014-02-27 (×3): 12.5 ug via INTRAVENOUS
  Filled 2014-02-23 (×5): qty 5

## 2014-02-23 MED ORDER — DIAZEPAM 5 MG/ML IJ SOLN
2.0000 mg | Freq: Every day | INTRAMUSCULAR | Status: DC
  Administered 2014-02-24 – 2014-02-26 (×3): 2 mg via INTRAVENOUS
  Filled 2014-02-23 (×3): qty 2

## 2014-02-23 MED ORDER — HEPARIN (PORCINE) IN NACL 100-0.45 UNIT/ML-% IJ SOLN
1000.0000 [IU]/h | INTRAMUSCULAR | Status: DC
  Administered 2014-02-23: 1000 [IU]/h via INTRAVENOUS
  Filled 2014-02-23 (×3): qty 250

## 2014-02-23 MED ORDER — DIAZEPAM 5 MG/ML IJ SOLN: 2.0000 mg | Freq: Every day | INTRAMUSCULAR | Status: DC

## 2014-02-23 NOTE — Progress Notes (Signed)
INITIAL NUTRITION ASSESSMENT  DOCUMENTATION CODES Per approved criteria  -Not Applicable   INTERVENTION: Diet texture and liquid consistency per SLP. Recommend Ensure Complete po BID, each supplement provides 350 kcal and 13 grams of protein, once diet order permits. RD to continue to follow nutrition care plan.  NUTRITION DIAGNOSIS: Inadequate oral intake related to poor appetite as evidenced by family report.   Goal: Diet advancement. Intake to meet >90% of estimated nutrition needs.  Monitor:  weight trends, lab trends, I/O's, PO intake, supplement tolerance  Reason for Assessment: Low Braden Score  80 y.o. female  Admitting Dx: generalized weakness  ASSESSMENT: PMHx significant for HTN, dysphagia, afib, TIA, GERD. From home with son. Admitted with increased weakness. Work-up reveals dehydration and anemia.  Plan to consult GI for heme positive stools. Pt is currently NPO. Discussed nutrition hx with son at bedside. Per son, pt has had nothing to eat for the past 3 days. Prior to that, pt had decreased appetite x 1 week - limiting her meals to 1 meal daily. Normal appetite and PO intake for pt is 2 meals daily. Pt can tolerate drinks such as Boost and Ensure. Pt with SLP evaluation pending. Son confirms that pt has a hx of dysphagia that went away at once but now she has trouble even swallowing her pills.  Usual weight is around 148 lb, this is consistent with initial admission weight. Pt is at nutrition risk given very poor oral intake.  Potassium is low 3.4. Sodium is elevated at 148. Receiving NS at 125 ml/hr.  Height: Ht Readings from Last 1 Encounters:  02/22/14 5\' 5"  (1.651 m)    Weight: Wt Readings from Last 1 Encounters:  02/23/14 166 lb 10.7 oz (75.6 kg)  Admit wt 149 lb; question accuracy of current weight  Ideal Body Weight: 125 lb  % Ideal Body Weight: 133%  Wt Readings from Last 10 Encounters:  02/23/14 166 lb 10.7 oz (75.6 kg)  09/10/13 149 lb  (67.586 kg)  09/10/13 149 lb (67.586 kg)  09/02/13 142 lb (64.411 kg)  02/28/13 149 lb 11.1 oz (67.9 kg)  02/22/13 144 lb 3.2 oz (65.409 kg)  11/03/12 159 lb 2.8 oz (72.2 kg)  09/03/12 159 lb 13.3 oz (72.5 kg)  06/12/12 157 lb (71.215 kg)    Usual Body Weight: 150 - 160 lb  % Usual Body Weight: 107%  BMI:  Body mass index is 27.73 kg/(m^2). Overweight  Estimated Nutritional Needs: Kcal: 1450 - 1600 Protein: 70 - 80 g Fluid: approx 2 liters daily  Skin:  Stage I pressure ulcer to bilateral heels Stage II to L buttocks Stage II to medial sacrum  Diet Order: NPO  EDUCATION NEEDS: -No education needs identified at this time   Intake/Output Summary (Last 24 hours) at 02/23/14 1323 Last data filed at 02/23/14 1129  Gross per 24 hour  Intake 1963.33 ml  Output   1950 ml  Net  13.33 ml    Last BM: 3/21  Labs:   Recent Labs Lab 02/22/14 1350 02/23/14 0239  NA 143 148*  K 3.9 3.4*  CL 101 110  CO2 22 20  BUN 159* 139*  CREATININE 4.70* 3.94*  CALCIUM 9.6 8.8  GLUCOSE 103* 101*    CBG (last 3)  No results found for this basename: GLUCAP,  in the last 72 hours  Scheduled Meds: . acetaminophen-codeine  1 tablet Oral QHS   And  . codeine  30 mg Oral QHS  . diazepam  2 mg Intravenous QHS  . fluticasone  1 spray Each Nare Daily  . ipratropium  1 spray Nasal q morning - 10a  . levothyroxine  12.5 mcg Intravenous Daily    Continuous Infusions: . sodium chloride 125 mL/hr at 02/23/14 0734  . heparin      Past Medical History  Diagnosis Date  . Hypertension   . Hyperlipidemia   . GERD (gastroesophageal reflux disease)   . Dysphagia     appt with Molly Maduroobert Buccini upcoming  . Chronic recurrent sinusitis   . Recurrent UTI   . Atrial fibrillation     ???  . Arthritis   . TIA (transient ischemic attack)   . Anemia   . Hypothyroidism     Past Surgical History  Procedure Laterality Date  . Pacemaker insertion    . Back surgery  1985  . Shoulder  surgery  2008    Left  (Dr. Ranell PatrickNorris)  . Total knee arthroplasty  2000, 2001    bilateral (Dr. Despina HickAlusio), left 2000, right 2001  . Cataract extraction  1986, 2003    left 1986, right 2003  . Vaginal hysterectomy  1969  . Bilateral salpingoophorectomy  1971  . Tonsillectomy  1951  . Appendectomy  1952  . Mastoidectomy  1970  . Sinus exploration  1972  . Knee arthroscopy  1988, 1995, 2000  . Hemorrhoid surgery  2009  . Repair / reconstruction interphalangeal joint  2004    R thumb  . Cystoscopy  1992  . Insert / replace / remove pacemaker      Jarold MottoSamantha Charell Faulk MS, RD, LDN Inpatient Registered Dietitian Pager: [phone] After-hours pager: [phone]

## 2014-02-23 NOTE — Progress Notes (Signed)
Patient seen, examined and discussed with my nurse practitioner. Agree with her note. Following transfusion, hemoglobin better, however patient still remains dry and hypotensive. Have increased IV fluids. Gastroenterology to see. Still quite somnolent, not very interactive. Monitor sodium levels closely. Continue in step down unit.

## 2014-02-23 NOTE — Progress Notes (Signed)
ANTICOAGULATION CONSULT NOTE - Initial Consult  Pharmacy Consult for heparin Indication: atrial fibrillation  No Known Allergies  Patient Measurements: Height: 5\' 5"  (165.1 cm) Weight: 166 lb 10.7 oz (75.6 kg) IBW/kg (Calculated) : 57 Heparin Dosing Weight: 72.6 kg  Vital Signs: Temp: 97.6 F (36.4 C) (03/23 1128) Temp src: Axillary (03/23 1128) BP: 87/45 mmHg (03/23 1000) Pulse Rate: 63 (03/23 1000)  Labs:  Recent Labs  02/22/14 1350 02/22/14 1708 02/23/14 0239  HGB 5.9*  --  8.1*  HCT 17.7*  --  24.5*  PLT 164  --  129*  LABPROT 19.0*  --   --   INR 1.64*  --   --   CREATININE 4.70*  --  3.94*  CKTOTAL  --  490*  --     Estimated Creatinine Clearance: 11.6 ml/min (by C-G formula based on Cr of 3.94).   Medical History: Past Medical History  Diagnosis Date  . Hypertension   . Hyperlipidemia   . GERD (gastroesophageal reflux disease)   . Dysphagia     appt with Molly Maduro Buccini upcoming  . Chronic recurrent sinusitis   . Recurrent UTI   . Atrial fibrillation     ???  . Arthritis   . TIA (transient ischemic attack)   . Anemia   . Hypothyroidism     Medications:  Prescriptions prior to admission  Medication Sig Dispense Refill  . acetaminophen-codeine (TYLENOL #4) 300-60 MG per tablet Take 1 tablet by mouth at bedtime.      Marland Kitchen allopurinol (ZYLOPRIM) 100 MG tablet Take 1 tablet (100 mg total) by mouth 2 (two) times daily.  60 tablet  1  . amLODipine (NORVASC) 10 MG tablet Take 1 tablet (10 mg total) by mouth daily.  180 tablet  3  . apixaban (ELIQUIS) 2.5 MG TABS tablet Take 5 mg by mouth daily.      Marland Kitchen atorvastatin (LIPITOR) 20 MG tablet Take 20 mg by mouth at bedtime.      Marland Kitchen buPROPion (WELLBUTRIN XL) 150 MG 24 hr tablet Take 150 mg by mouth daily.       . calcitRIOL (ROCALTROL) 0.25 MCG capsule Take 1 capsule (0.25 mcg total) by mouth every morning.  30 capsule  1  . carvedilol (COREG) 3.125 MG tablet Take 3.125 mg by mouth 2 (two) times daily with a  meal.      . dextromethorphan-guaiFENesin (MUCINEX DM) 30-600 MG per 12 hr tablet Take 2 tablets by mouth daily.      . diazepam (VALIUM) 10 MG tablet Take 10 mg by mouth at bedtime.       . DULoxetine (CYMBALTA) 60 MG capsule Take 1 capsule (60 mg total) by mouth every morning.  30 capsule  1  . furosemide (LASIX) 40 MG tablet Take 1 tablet by mouth daily.      . hydrALAZINE (APRESOLINE) 25 MG tablet Take 25 mg by mouth 2 (two) times daily.      Marland Kitchen ipratropium (ATROVENT) 0.06 % nasal spray Place 1 spray into the nose every morning.       Marland Kitchen levothyroxine (SYNTHROID, LEVOTHROID) 25 MCG tablet Take 1 tablet (25 mcg total) by mouth every morning.  30 tablet  1  . losartan (COZAAR) 50 MG tablet Take 50 mg by mouth 2 (two) times daily.      . Multiple Vitamin (MULTIVITAMIN WITH MINERALS) TABS Take 1 tablet by mouth every morning.       . pantoprazole (PROTONIX) 40 MG tablet Take 80  mg by mouth 2 (two) times daily.       . predniSONE (DELTASONE) 10 MG tablet Take 20 mg by mouth daily with breakfast.      . sotalol (BETAPACE) 120 MG tablet Take 1 tablet (120 mg total) by mouth 2 (two) times daily.  60 tablet  1  . tiZANidine (ZANAFLEX) 2 MG tablet Take 2 mg by mouth at bedtime.       . tolterodine (DETROL LA) 4 MG 24 hr capsule Take 4 mg by mouth daily.      Marland Kitchen. triamcinolone (NASACORT) 55 MCG/ACT nasal inhaler Place 1 spray into the nose daily.        Assessment: 80 y/o female with history of Afib on apixaban PTA. Pharmacy consulted to begin a heparin drip while apixaban is on hold. Spoke with son and confirmed patient takes apixaban 2.5 mg PO bid and her last dose was 3/21 pm. No overt bleeding noted, Hb was 5.9 on admission and patient was given 2 units PRBCs, Hb up to 8.1 this morning.   Goal of Therapy:  Heparin level 0.3-0.7 units/ml Monitor platelets by anticoagulation protocol: Yes   Plan:  -Heparin drip at 1000 units/hr with no bolus due to anemia -8 hr heparin level -Daily heparin level  and CBC -Monitor for s/sx of bleeding  [facility] Vision ParkJennifer Catawissa, DeversPharm.D., BCPS Clinical Pharmacist Pager: [phone] 02/23/2014 11:35 AM

## 2014-02-23 NOTE — Progress Notes (Signed)
Utilization review completed.  

## 2014-02-23 NOTE — Consult Note (Signed)
Subjective:   HPI  The patient is an 80 year old female who was admitted to the hospital after being brought to the emergency room by her son who is her primary caregiver. He has noticed that over the past few days the patient has been increasingly lethargic, sleeping a lot, hard to arouse, and not eating. She does have a history of stroke according to him but his CT of the head on this admission did not reveal any acute changes to suggest a new stroke. She was found to be anemic and have heme positive stool. Her initial hemoglobin and hematocrit on admission were 5.9 and 17. According to the son he has not noticed any signs of bleeding. She had an EGD in 2008 which was essentially normal, and a colonoscopy in 2008 which showed diverticulosis and some diminutive colon polyps. She has also been found to have elevated BUN and creatinine on this admission. Renal consult is pending.  Review of Systems Unable to obtain as the patient is lethargic, and not arousing  Past Medical History  Diagnosis Date  . Hypertension   . Hyperlipidemia   . GERD (gastroesophageal reflux disease)   . Dysphagia     appt with Molly Maduro Buccini upcoming  . Chronic recurrent sinusitis   . Recurrent UTI   . Atrial fibrillation     ???  . Arthritis   . TIA (transient ischemic attack)   . Anemia   . Hypothyroidism    Past Surgical History  Procedure Laterality Date  . Pacemaker insertion    . Back surgery  1985  . Shoulder surgery  2008    Left  (Dr. Ranell Patrick)  . Total knee arthroplasty  2000, 2001    bilateral (Dr. Despina Hick), left 2000, right 2001  . Cataract extraction  1986, 2003    left 1986, right 2003  . Vaginal hysterectomy  1969  . Bilateral salpingoophorectomy  1971  . Tonsillectomy  1951  . Appendectomy  1952  . Mastoidectomy  1970  . Sinus exploration  1972  . Knee arthroscopy  1988, 1995, 2000  . Hemorrhoid surgery  2009  . Repair / reconstruction interphalangeal joint  2004    R thumb  .  Cystoscopy  1992  . Insert / replace / remove pacemaker     History   Social History  . Marital Status: Divorced    Spouse Name: N/A    Number of Children: 5  . Years of Education: N/A   Occupational History  . Print production planner     retired   Social History Main Topics  . Smoking status: Former Smoker -- 1.00 packs/day for 15 years    Types: Cigarettes    Quit date: 12/04/1981  . Smokeless tobacco: Never Used  . Alcohol Use: No  . Drug Use: No  . Sexual Activity: No   Other Topics Concern  . Not on file   Social History Narrative   Patient lives at home with   family history includes Heart attack in her father; Stroke in her mother. Current facility-administered medications:0.9 %  sodium chloride infusion, , Intravenous, Continuous, Russella Dar, NP, Last Rate: 125 mL/hr at 02/23/14 0734;  acetaminophen-codeine (TYLENOL #3) 300-30 MG per tablet 1 tablet, 1 tablet, Oral, QHS, Yevonne Pax, MD;  codeine tablet 30 mg, 30 mg, Oral, QHS, Yevonne Pax, MD;  diazepam (VALIUM) injection 2 mg, 2 mg, Intravenous, QHS, Russella Dar, NP fluticasone (FLONASE) 50 MCG/ACT nasal spray 1 spray, 1 spray, Each  Nare, Daily, Yevonne PaxSaadat A Khan, MD, 1 spray at 02/23/14 0935;  heparin ADULT infusion 100 units/mL (25000 units/250 mL), 1,000 Units/hr, Intravenous, Continuous, [facility] LPJennifer Danielle [place], [place], Last Rate: 10 mL/hr at 02/23/14 1407, 1,000 Units/hr at 02/23/14 1407 ipratropium (ATROVENT) 0.06 % nasal spray 1 spray, 1 spray, Nasal, q morning - 10a, Yevonne PaxSaadat A Khan, MD, 1 spray at 02/23/14 [id number];  levothyroxine (SYNTHROID, LEVOTHROID) injection 12.5 mcg, 12.5 mcg, Intravenous, Daily, Russella DarAllison L Ellis, NP;  ondansetron [facility](ZOFRAN) injection 4 mg, 4 mg, Intravenous, Q6H PRN, Yevonne PaxSaadat A Khan, MD;  ondansetron (ZOFRAN) tablet 4 mg, 4 mg, Oral, Q6H PRN, Yevonne PaxSaadat A Khan, MD No Known Allergies   Objective:     BP 95/42  Pulse 66  Temp(Src) 98.3 F (36.8 C) (Oral)  Resp 13  Ht 5\' 5"  (1.651 m)  Wt 75.6 kg  (166 lb 10.7 oz)  BMI 27.73 kg/m2  SpO2 94%  Lethargic  In no obvious distress  Heart regular rhythm  Lungs clear  Abdomen: Bowel sounds normal, soft, nontender       Assessment:     Lethargy  Anemia  Heme positive stool  Elevated BUN and creatinine      Plan:     At this point it does not appear that she has significant acute gastrointestinal bleeding occurring. Her lethargy is of concern and the etiology of this is unclear. I spoke to the son about her heme-positive stool and anemia but we both think that at this particular point in time given her overall medical condition we should not proceed with invasive endoscopic evaluation. I would recommend following her clinically. Transfuse blood as needed. Watch for signs of significant bleeding. Make further decisions as the days progress and her overall clinical situation becomes more clear.    Component Value Date/Time   WBC 14.0* 02/23/2014 0239   HGB 8.1* 02/23/2014 0239   HCT 24.5* 02/23/2014 0239   PLT 129* 02/23/2014 0239   ALT 14 02/22/2014 1350   AST 43* 02/22/2014 1350   NA 148* 02/23/2014 0239   K 3.4* 02/23/2014 0239   CL 110 02/23/2014 0239   CREATININE 3.94* 02/23/2014 0239   BUN 139* 02/23/2014 0239   CO2 20 02/23/2014 0239   CALCIUM 8.8 02/23/2014 0239   ALKPHOS 46 02/22/2014 1350    Lab Results  Component Value Date   HGB 8.1* 02/23/2014   HGB 5.9* 02/22/2014   HGB 10.0* 09/02/2013   HCT 24.5* 02/23/2014   HCT 17.7* 02/22/2014   HCT 30.9* 09/02/2013   ALKPHOS 46 02/22/2014   ALKPHOS 61 02/26/2013   ALKPHOS 76 02/20/2013   AST 43* 02/22/2014   AST 12 02/26/2013   AST 18 02/20/2013   ALT 14 02/22/2014   ALT 6 02/26/2013   ALT 8 02/20/2013

## 2014-02-23 NOTE — Evaluation (Signed)
Clinical/Bedside Swallow Evaluation Patient Details  Name: [name] MRN: [id number] Date of Birth: 09/23/1931  Today's Date: 02/23/2014 Time: [id number] SLP Time Calculation (min): 15 min  Past Medical History:  Past Medical History  Diagnosis Date  . Hypertension   . Hyperlipidemia   . GERD (gastroesophageal reflux disease)   . Dysphagia     appt with Bridget Maduroobert Whitehead upcoming  . Chronic recurrent sinusitis   . Recurrent UTI   . Atrial fibrillation     ???  . Arthritis   . TIA (transient ischemic attack)   . Anemia   . Hypothyroidism    Past Surgical History:  Past Surgical History  Procedure Laterality Date  . Pacemaker insertion    . Back surgery  1985  . Shoulder surgery  2008    Left  (Dr. Ranell PatrickNorris)  . Total knee arthroplasty  2000, 2001    bilateral (Dr. Despina HickAlusio), left 2000, right 2001  . Cataract extraction  1986, 2003    left 1986, right 2003  . Vaginal hysterectomy  1969  . Bilateral salpingoophorectomy  1971  . Tonsillectomy  1951  . Appendectomy  1952  . Mastoidectomy  1970  . Sinus exploration  1972  . Knee arthroscopy  1988, 1995, 2000  . Hemorrhoid surgery  2009  . Repair / reconstruction interphalangeal joint  2004    R thumb  . Cystoscopy  1992  . Insert / replace / remove pacemaker     HPI:  80 year old female patient who lives with her son. Brought to the emergency department because of generalized weakness and altered mentation. Recent treatment by her PCP for bronchitis and was given antibiotics and prednisone taper. Patient has a history of falling that has worsened since December but over the past week she's had more progressive weakness. Per son, has a hisotry of GERD and has  upcoming appointment with Dr. Matthias HughsBuccini due to worsening globus with occassional regurgitation of bolus.    Assessment / Plan / Recommendation Clinical Impression  Patient presents with a suspected primary esophageal dysphagia characterized by SLP observation of  signficant belching post swallow and family reports of h/o GERD with worsening globus and regurgitation of bolus. Patient with what appears to be an intact oropharyngeal swallow however significant lethargy requring max SLP cueing to maintain adequate level of alertness and resulting in oral A-P transit delays  is largest aspiration risk at this time. Recommend NPO except necessary meds crushed in puree and ice chips PRN after oral care to wet mucosa. SLP will f/u at bedside 3/24 to determine potential to advance diet. Prognosis good with improved mentation.      Aspiration Risk  Severe    Diet Recommendation NPO except meds;Ice chips PRN after oral care   Medication Administration: Crushed with puree    Other  Recommendations Oral Care Recommendations: Oral care Q4 per protocol;Oral care prior to ice chips   Follow Up Recommendations  None    Frequency and Duration min 3x week  2 weeks   Pertinent Vitals/Pain n/a     Swallow Study    General HPI: 80 year old female patient who lives with her son. Brought to the emergency department because of generalized weakness and altered mentation. Recent treatment by her PCP for bronchitis and was given antibiotics and prednisone taper. Patient has a history of falling that has worsened since December but over the past week she's had more progressive weakness. Per son, has a hisotry of GERD and has  upcoming appointment with  Dr. Matthias Whitehead due to worsening globus with occassional regurgitation of bolus.  Type of Study: Bedside swallow evaluation Previous Swallow Assessment: none Diet Prior to this Study: NPO Temperature Spikes Noted: No Respiratory Status: Nasal cannula History of Recent Intubation: No Behavior/Cognition: Lethargic Oral Cavity - Dentition: Dentures, top;Adequate natural dentition Self-Feeding Abilities: Needs assist Patient Positioning: Upright in bed Baseline Vocal Quality: Clear;Low vocal intensity Volitional Cough:  Weak Volitional Swallow: Able to elicit    Oral/Motor/Sensory Function Overall Oral Motor/Sensory Function: Appears within functional limits for tasks assessed   Ice Chips Ice chips: Impaired Presentation: Spoon Oral Phase Impairments: Impaired anterior to posterior transit Oral Phase Functional Implications: Prolonged oral transit   Thin Liquid Thin Liquid: Impaired Presentation: Straw Oral Phase Impairments: Impaired anterior to posterior transit Oral Phase Functional Implications: Prolonged oral transit    Nectar Thick Nectar Thick Liquid: Not tested   Honey Thick Honey Thick Liquid: Not tested   Puree Puree: Impaired Presentation: Spoon Oral Phase Impairments: Impaired anterior to posterior transit Oral Phase Functional Implications: Prolonged oral transit   Solid   GO   Bridget Yazdani MA, CCC-SLP [phone]  Solid: Not tested       [name] [name] 02/23/2014,4:27 PM

## 2014-02-23 NOTE — Progress Notes (Signed)
[facility]  [name] WUJ:[id number] DOB: February 05, 1931 DOA: 02/22/2014 PCP: Johny Blamer, MD  Brief narrative: 80 year old female patient who lives with her son. Brought to the emergency department because of generalized weakness and altered mentation. Recent treatment by her PCP for bronchitis and was given antibiotics and prednisone taper. Patient has a history of falling that has worsened since December but over the past week she's had more progressive weakness.  In the emergency department she was hypotensive and was given fluid challenges which improved her blood pressure. Her hemoglobin was quite low at 5.9 and she was Hemoccult positive. Last known hemoglobin within the 10 system was 10 in September 2014. Her creatinine had also markedly increased at 4.7 with a baseline of 1.1 and marked elevation in BUN. No apparent obvious signs of GI bleeding reported by the family and unclear if the patient would be able to report similar symptoms.  Assessment/Plan: Active Problems:   Anemia/Hematest positive stools -Hemoglobin up to 8.1 after 2 units packed red blood cells this admission -No overt bleeding seen since admission -Consult gastroenterology -Serial CBC    Acute respiratory failure with hypoxia/recent outpatient treatment for bronchitis -Chest x-ray at admission unremarkable the patient also profoundly dehydrated -Repeat chest x-ray once euvolemic to determine if fluffs out for pneumonia process -Hypoxemia could be primarily related to altered mentation and poor respiratory effort -Check ABG    Metabolic encephalopathy -Seems to be related to symptomatic anemia and dehydration as well as possible urinary tract infection -Significant azotemia contributing -RN says pt able to swallow pills in applesauce- SLP eval pending    Hypotension -Influenced by anemia as well as dehydration -Most recent MAP less than 65 -IV fluids increased  this morning to 125 cc per hour    Dehydration with hypernatremia -Sodium has increased since admission and suspect related to dehydration -Fluids adjusted as above -Check urinary sodium=28   Thrombocytopenia -Onset after admission -Follow closely; could be secondary to sepsis but given anemia could be a marker of evolving hemolysis    Atrial fibrillation -Rate controlled with atrial pacing 100% -On eliquis prior to admission-holding due to ABL anemia and diff with rapid reversal with acute GIB -Will ask pharmacy to dose IV heparin since a shorter acting and can follow more closely with thrombocytopenia and ABL anemia- watch for active bleeding    Hypokalemia -IV replete and follow labs    AKI (acute kidney injury) on CKD (chronic kidney disease), stage III -Baseline creatinine 1.1 -BUN and creatinine trending down with hydration and restoration of blood volume   Abnormal urinalysis -Does not appear to be consistent with UTI but followup on all cultures -No indications for antibiotics at this time    Chronic diastolic heart failure -Compensated at present      Hypertension -BP soft do to above issues -Home medications on hold   Hypothyroidism -Convert oral medication to IV    History of CVA (cerebrovascular accident)    Pacemaker   DVT prophylaxis: SCDs Code Status: Full Family Communication: Spoke with son at bedside Disposition Plan/Expected LOS: step down   Consultants: Gastroenterology  Procedures: None  Antibiotics: None  HPI/Subjective: Patient very lethargic and briefly opens eyes to tactile stimulus but is nonverbal. No family at bedside  Objective: Blood pressure 87/45, pulse 63, temperature 97.6 F (36.4 C), temperature source Oral, resp. rate 14, height 5\' 5"  (1.651 m), weight 166 lb 10.7 oz (75.6 kg), SpO2 92.00%.  Intake/Output  Summary (Last 24 hours) at 02/23/14 1104 Last data filed at 02/23/14 1000  Gross per 24 hour  Intake 1963.33 ml   Output   1725 ml  Net 238.33 ml     Exam: General: No acute respiratory distress Lungs: Clear to auscultation bilaterally without wheezes or crackles, 3 L Cardiovascular: Irregular rate and rhythm without murmur gallop or rub normal S1 and S2, no peripheral edema or JVD Abdomen: Nontender, nondistended, soft, bowel sounds positive, no rebound, no ascites, no appreciable mass Musculoskeletal: No significant cyanosis, clubbing of bilateral lower extremities Neurological: Lethargic and barely responsive to verbal and tactile stimulus during our exam this am.  Scheduled Meds:  Scheduled Meds: . acetaminophen-codeine  1 tablet Oral QHS   And  . codeine  30 mg Oral QHS  . allopurinol  100 mg Oral BID  . atorvastatin  20 mg Oral QHS  . buPROPion  150 mg Oral Daily  . calcitRIOL  0.25 mcg Oral q morning - 10a  . dextromethorphan-guaiFENesin  2 tablet Oral Daily  . diazepam  10 mg Oral QHS  . DULoxetine  60 mg Oral q morning - 10a  . fesoterodine  8 mg Oral Daily  . fluticasone  1 spray Each Nare Daily  . ipratropium  1 spray Nasal q morning - 10a  . levothyroxine  25 mcg Oral QAC breakfast  . multivitamin with minerals  1 tablet Oral q morning - 10a   Continuous Infusions: . sodium chloride 125 mL/hr at 02/23/14 0734    Data Reviewed: Basic Metabolic Panel:  Recent Labs Lab 02/22/14 1350 02/23/14 0239  NA 143 148*  K 3.9 3.4*  CL 101 110  CO2 22 20  GLUCOSE 103* 101*  BUN 159* 139*  CREATININE 4.70* 3.94*  CALCIUM 9.6 8.8   Liver Function Tests:  Recent Labs Lab 02/22/14 1350  AST 43*  ALT 14  ALKPHOS 46  BILITOT 0.4  PROT 5.1*  ALBUMIN 2.4*    Recent Labs Lab 02/22/14 1350  LIPASE 15   No results found for this basename: AMMONIA,  in the last 168 hours CBC:  Recent Labs Lab 02/22/14 1350 02/23/14 0239  WBC 15.9* 14.0*  NEUTROABS 14.4*  --   HGB 5.9* 8.1*  HCT 17.7* 24.5*  MCV 90.3 86.6  PLT 164 129*   Cardiac Enzymes:  Recent Labs Lab  02/22/14 1708  CKTOTAL 490*   BNP (last 3 results) No results found for this basename: PROBNP,  in the last 8760 hours CBG: No results found for this basename: GLUCAP,  in the last 168 hours  Recent Results (from the past 240 hour(s))  CULTURE, BLOOD (ROUTINE X 2)     Status: None   Collection Time    02/22/14  1:32 PM      Result Value Ref Range Status   Specimen Description BLOOD RIGHT ARM   Final   Special Requests BOTTLES DRAWN AEROBIC AND ANAEROBIC 8CC   Final   Culture  Setup Time     Final   Value: 02/22/2014 19:17     Performed at [facility]   Culture     Final   Value:        BLOOD CULTURE RECEIVED NO GROWTH TO DATE CULTURE WILL BE HELD FOR 5 DAYS BEFORE ISSUING A FINAL NEGATIVE REPORT     Performed at [facility]   Report Status PENDING   Incomplete  CULTURE, BLOOD (ROUTINE X 2)     Status: None  Collection Time    02/22/14  1:37 PM      Result Value Ref Range Status   Specimen Description BLOOD RIGHT HAND   Final   Special Requests BOTTLES DRAWN AEROBIC ONLY [facility]7CC   Final   Culture  Setup Time     Final   Value: 02/22/2014 19:17     [facility]   Culture     Final   Value:        BLOOD CULTURE RECEIVED NO GROWTH TO DATE CULTURE WILL BE HELD FOR 5 DAYS BEFORE ISSUING A FINAL NEGATIVE REPORT     [facility] at [facility]   Report Status PENDING   Incomplete  MRSA PCR SCREENING     Status: None   Collection Time    02/22/14  9:08 PM      Result Value Ref Range Status   MRSA by PCR NEGATIVE  NEGATIVE Final   Comment:            The GeneXpert MRSA Assay (FDA     approved for NASAL specimens     only), is one component of a     comprehensive MRSA colonization     surveillance program. It is not     intended to diagnose MRSA     infection nor to guide or     monitor treatment for     MRSA infections.     Studies:  Recent x-ray studies have been reviewed in detail by the Attending Physician  Time spent :       Junious Silkllison Chalene Treu, ANP [facility] Office  [phone] Pager [phone]  **If unable to reach the above provider after paging please contact the Flow Manager @ [phone]  On-Call/Text Page:      [email or web address]      password [email or web address]  If 7PM-7AM, please contact night-coverage [email or web address] Password [email or web address] 02/23/2014, 11:04 AM   LOS: 1 day

## 2014-02-24 DIAGNOSIS — E870 Hyperosmolality and hypernatremia: Secondary | ICD-10-CM | POA: Diagnosis present

## 2014-02-24 DIAGNOSIS — I5032 Chronic diastolic (congestive) heart failure: Secondary | ICD-10-CM

## 2014-02-24 DIAGNOSIS — I4891 Unspecified atrial fibrillation: Secondary | ICD-10-CM

## 2014-02-24 DIAGNOSIS — J9600 Acute respiratory failure, unspecified whether with hypoxia or hypercapnia: Secondary | ICD-10-CM

## 2014-02-24 DIAGNOSIS — G9341 Metabolic encephalopathy: Secondary | ICD-10-CM

## 2014-02-24 LAB — CBC
HCT: 25.8 % — ABNORMAL LOW (ref 36.0–46.0)
HEMATOCRIT: 24.4 % — AB (ref 36.0–46.0)
HEMOGLOBIN: 8.1 g/dL — AB (ref 12.0–15.0)
Hemoglobin: 8.5 g/dL — ABNORMAL LOW (ref 12.0–15.0)
MCH: 28.7 pg (ref 26.0–34.0)
MCH: 28.7 pg (ref 26.0–34.0)
MCHC: 33.2 g/dL (ref 30.0–36.0)
MCHC: 32.9 g/dL (ref 30.0–36.0)
MCV: 86.5 fL (ref 78.0–100.0)
MCV: 87.2 fL (ref 78.0–100.0)
Platelets: 122 10*3/uL — ABNORMAL LOW (ref 150–400)
Platelets: 122 10*3/uL — ABNORMAL LOW (ref 150–400)
RBC: 2.82 MIL/uL — ABNORMAL LOW (ref 3.87–5.11)
RBC: 2.96 MIL/uL — AB (ref 3.87–5.11)
RDW: 19.2 % — ABNORMAL HIGH (ref 11.5–15.5)
RDW: 19.1 % — ABNORMAL HIGH (ref 11.5–15.5)
WBC: 12.6 10*3/uL — AB (ref 4.0–10.5)
WBC: 12.6 10*3/uL — ABNORMAL HIGH (ref 4.0–10.5)

## 2014-02-24 LAB — BLOOD GAS, ARTERIAL
ACID-BASE DEFICIT: 3.4 mmol/L — AB (ref 0.0–2.0)
BICARBONATE: 21.7 meq/L (ref 20.0–24.0)
Drawn by: 313941
O2 Content: 2 L/min
O2 SAT: 89.8 %
PATIENT TEMPERATURE: 98.6
TCO2: 23.1 mmol/L (ref 0–100)
pCO2 arterial: 43.5 mmHg (ref 35.0–45.0)
pH, Arterial: 7.319 — ABNORMAL LOW (ref 7.350–7.450)
pO2, Arterial: 57.5 mmHg — ABNORMAL LOW (ref 80.0–100.0)

## 2014-02-24 LAB — COMPREHENSIVE METABOLIC PANEL
ALT: 15 U/L (ref 0–35)
AST: 31 U/L (ref 0–37)
Albumin: 2.3 g/dL — ABNORMAL LOW (ref 3.5–5.2)
Alkaline Phosphatase: 44 U/L (ref 39–117)
BUN: 113 mg/dL — AB (ref 6–23)
CALCIUM: 8.3 mg/dL — AB (ref 8.4–10.5)
CO2: 20 mEq/L (ref 19–32)
Chloride: 114 mEq/L — ABNORMAL HIGH (ref 96–112)
Creatinine, Ser: 2.73 mg/dL — ABNORMAL HIGH (ref 0.50–1.10)
GFR calc non Af Amer: 15 mL/min — ABNORMAL LOW (ref >90)
GFR, EST AFRICAN AMERICAN: 18 mL/min — AB (ref >90)
Glucose, Bld: 92 mg/dL (ref 70–99)
Potassium: 3.1 mEq/L — ABNORMAL LOW (ref 3.7–5.3)
SODIUM: 151 meq/L — AB (ref 137–147)
TOTAL PROTEIN: 4.9 g/dL — AB (ref 6.0–8.3)
Total Bilirubin: 0.5 mg/dL (ref 0.3–1.2)

## 2014-02-24 LAB — URINE CULTURE: Colony Count: >=100000

## 2014-02-24 LAB — APTT
APTT: 45 s — AB (ref 24–37)
aPTT: 168 seconds — ABNORMAL HIGH (ref 24–37)

## 2014-02-24 LAB — HEPARIN LEVEL (UNFRACTIONATED)
Heparin Unfractionated: >2.2 IU/mL — ABNORMAL HIGH (ref 0.30–0.70)
Heparin Unfractionated: 1.98 IU/mL — ABNORMAL HIGH (ref 0.30–0.70)

## 2014-02-24 MED ORDER — DEXTROSE 5 % IV SOLN
INTRAVENOUS | Status: DC
  Administered 2014-02-24 (×2): via INTRAVENOUS

## 2014-02-24 MED ORDER — HEPARIN (PORCINE) IN NACL 100-0.45 UNIT/ML-% IJ SOLN
1150.0000 [IU]/h | INTRAMUSCULAR | Status: DC
  Administered 2014-02-24: 1000 [IU]/h via INTRAVENOUS
  Administered 2014-02-25: 1150 [IU]/h via INTRAVENOUS
  Filled 2014-02-24 (×2): qty 250

## 2014-02-24 NOTE — Progress Notes (Signed)
Speech Language Pathology Treatment: Dysphagia  Patient Details Name: [name] MRN: [id number] DOB: 06/01/1931 Today's Date: 02/24/2014 Time: [id number] SLP Time Calculation (min): 27 min  Assessment / Plan / Recommendation Clinical Impression  Extensive oral care completed as pt appears to have bled from her nose/nasopharynx with evidence of blood down posterior pharyngeal wall, hard and soft palate and pooling and hardening on teeth and lips. After oral care, pt more alert, able to accept small straw sips of water with tactile cues. Continued signs of esophageal dysphagia with multiple swallows, belching and eventual throat clearing. Pt may have small sips of clear liquids for comfort, but would strongly suggest esophagram prior to PO as pt has had an acute worsening of these symptoms over the past few weeks. MD paged for order. Discussed with RN.    HPI HPI: 80 year old female patient who lives with her son. Brought to the emergency department because of generalized weakness and altered mentation. Recent treatment by her PCP for bronchitis and was given antibiotics and prednisone taper. Patient has a history of falling that has worsened since December but over the past week she's had more progressive weakness. Per son, has a hisotry of GERD and has  upcoming appointment with Dr. Matthias HughsBuccini due to worsening globus with occassional regurgitation of bolus.    Pertinent Vitals NA  SLP Plan  Continue with current plan of care    Recommendations Diet recommendations: Thin liquid Liquids provided via: Straw Medication Administration: Crushed with puree Supervision: Staff to assist with self feeding Compensations: Slow rate;Small sips/bites Postural Changes and/or Swallow Maneuvers: Seated upright 90 degrees;Upright 30-60 min after meal              Oral Care Recommendations: Oral care BID Follow up Recommendations: 24 hour supervision/assistance Plan: Continue with current plan of care     GO    [facility] And Medical CenterBonnie Alyssia Heese, MA CCC-SLP [phone]  Claudine MoutonDeBlois, Daley Gosse Caroline 02/24/2014, 4:39 PM

## 2014-02-24 NOTE — Progress Notes (Signed)
ANTICOAGULATION CONSULT NOTE - Follow Up Consult  Pharmacy Consult for Heparin  Indication: atrial fibrillation  No Known Allergies  Patient Measurements: Height: 5\' 5"  (165.1 cm) Weight: 173 lb 8 oz (78.7 kg) IBW/kg (Calculated) : 57  Vital Signs: Temp: 98.1 F (36.7 C) (03/24 0435) Temp src: Oral (03/24 0435) BP: 122/56 mmHg (03/24 0600) Pulse Rate: 66 (03/24 0600)  Labs:  Recent Labs  02/22/14 1350 02/22/14 1708 02/23/14 0239 02/23/14 1445 02/23/14 1848 02/23/14 2048 02/24/14 0451  HGB 5.9*  --  8.1*  --  8.2*  --  8.1*  HCT 17.7*  --  24.5*  --  24.5*  --  24.4*  PLT 164  --  129*  --  120*  --  122*  APTT  --   --   --   --   --   --  168*  LABPROT 19.0*  --   --   --   --   --   --   INR 1.64*  --   --   --   --   --   --   HEPARINUNFRC  --   --   --   --   --  >2.20*  --   CREATININE 4.70*  --  3.94* 3.38*  --   --  2.73*  CKTOTAL  --  490*  --   --   --   --   --     Estimated Creatinine Clearance: 17 ml/min (by C-G formula based on Cr of 2.73).   Assessment: 80 y/o F on heparin for afib. The patient was on apixaban PTA and initially we only ordered a HL but likely due to her renal dysfunction she was still retaining some apixaban that created a HL >2.2. APTT was ordered which is still high at 168, but doesn't correlate with the HL, so will use aPTT for now until correlation occurs. No issues per RN.   Goal of Therapy:  Heparin level 0.3-0.7 units/ml aPTT 66-102 seconds Monitor platelets by anticoagulation protocol: Yes   Plan:  -Hold heparin for 1 hour -Restart heparin 800 units/hr at 0730 -HL at 1530 -Daily CBC/HL  Abran DukeLedford, Loxley Cibrian 02/24/2014,6:20 AM

## 2014-02-24 NOTE — Progress Notes (Signed)
[facility]  Victory Dakineggie R Schweizer WUJ:[id number] DOB: 06/28/1931 DOA: 02/22/2014 PCP: Johny BlamerHARRIS, WILLIAM, MD  Brief narrative: 80 year old female patient who lives with her son. Brought to the emergency department because of generalized weakness and altered mentation. Recent treatment by her PCP for bronchitis and was given antibiotics and prednisone taper. Patient has a history of falling that has worsened since December but over the past week she's had more progressive weakness.  In the emergency department she was hypotensive and was given fluid challenges which improved her blood pressure. Her hemoglobin was quite low at 5.9 and she was Hemoccult positive. Last known hemoglobin within the 10 system was 10 in September 2014. Her creatinine had also markedly increased at 4.7 with a baseline of 1.1 and marked elevation in BUN. No apparent obvious signs of GI bleeding reported by the family and unclear if the patient would be able to report similar symptoms.  Assessment/Plan: Active Problems:   Anemia/Hematest positive stools -Hemoglobin stable after 2 units packed red blood cells this admission -Consulted gastroenterology-  No indication to pursue endoscopic eval at this time -Serial CBC -no active bleeding seen since admission in setting of IV Heparin    Acute respiratory failure with hypoxia/recent outpatient treatment for bronchitis -Chest x-ray at admission unremarkable the patient also profoundly dehydrated -Repeat chest x-ray once euvolemic to determine if fluffs out for pneumonia process -Hypoxemia could be primarily related to altered mentation and poor respiratory effort -ABG 3/23 revealed PO2 57 with unknown baseline    Metabolic encephalopathy -Seems to be related to symptomatic anemia and dehydration as well as possible urinary tract infection -Significant azotemia contributing -more awake today -SLP eval 3/23 rec NPO- repeat eval today   Hypotension -Influenced by anemia as well as dehydration-now resolved -cont IVFs    Dehydration with hypernatremia -Sodium has increased since admission and suspect related to dehydration -Fluids adjusted to D5W on 3/24 -Check urinary sodium=28   Thrombocytopenia -Onset after admission -Follow closely; could be secondary to sepsis but given anemia could be a marker of evolving hemolysis    Atrial fibrillation -Rate controlled with atrial pacing 100% -On eliquis prior to admission-holding due to ABL anemia and diff with rapid reversal with acute GIB -Will ask pharmacy to dose IV heparin since a shorter acting and can follow more closely with thrombocytopenia and ABL anemia- watch for active bleeding    Hypokalemia -IV replete and follow labs    AKI (acute kidney injury) on CKD (chronic kidney disease), stage III -Baseline creatinine 1.1 -BUN and creatinine trending down with hydration and restoration of blood volume   Abnormal urinalysis -Does not appear to be consistent with UTI but followup on all cultures -No indications for antibiotics at this time    Chronic diastolic heart failure -Compensated at present      Hypertension -BP soft but improved -Home medications on hold   Hypothyroidism -Convert oral medication to IV until proves can swallow    History of CVA (cerebrovascular accident)    Pacemaker   DVT prophylaxis: SCDs Code Status: Full Family Communication: Spoke with son at bedside 3/23 and via phone 3/24 [name]([name] [phone])-requests Palliative eval for GOC and possible [facility] Disposition Plan/Expected LOS: Transfer to Telemetry   Consultants: Gastroenterology  Procedures: None  Antibiotics: None  HPI/Subjective: Patient sleepy but more alert than yesterday. No specific complaints  Objective: Blood pressure 118/62, pulse 72, temperature 97.8 F (36.6 C), temperature source Oral, resp. rate 24,  height 5\' 5"  (1.651 m), weight 173 lb 8 oz (78.7  kg), SpO2 95.00%.  Intake/Output Summary (Last 24 hours) at 02/24/14 1318 Last data filed at 02/24/14 1129  Gross per 24 hour  Intake 2674.75 ml  Output   1625 ml  Net 1049.75 ml     Exam: General: No acute respiratory distress Lungs: Clear to auscultation bilaterally without wheezes or crackles, 2 L Cardiovascular: Irregular rate and rhythm without murmur gallop or rub normal S1 and S2, no peripheral edema or JVD Abdomen: Nontender, nondistended, soft, bowel sounds positive, no rebound, no ascites, no appreciable mass Musculoskeletal: No significant cyanosis, clubbing of bilateral lower extremities Neurological: Awake, oriented x name, no obvious focal deficits.  Scheduled Meds:  Scheduled Meds: . acetaminophen-codeine  1 tablet Oral QHS   And  . codeine  30 mg Oral QHS  . diazepam  2 mg Intravenous QHS  . fluticasone  1 spray Each Nare Daily  . ipratropium  1 spray Nasal q morning - 10a  . levothyroxine  12.5 mcg Intravenous Daily   Continuous Infusions: . dextrose 100 mL/hr at 02/24/14 1030  . heparin 800 Units/hr (02/24/14 0825)    Data Reviewed: Basic Metabolic Panel:  Recent Labs Lab 02/22/14 1350 02/23/14 0239 02/23/14 1445 02/24/14 0451  NA 143 148* 151* 151*  K 3.9 3.4* 3.1* 3.1*  CL 101 110 115* 114*  CO2 22 20 21 20   GLUCOSE 103* 101* 90 92  BUN 159* 139* 128* 113*  CREATININE 4.70* 3.94* 3.38* 2.73*  CALCIUM 9.6 8.8 8.5 8.3*   Liver Function Tests:  Recent Labs Lab 02/22/14 1350 02/24/14 0451  AST 43* 31  ALT 14 15  ALKPHOS 46 44  BILITOT 0.4 0.5  PROT 5.1* 4.9*  ALBUMIN 2.4* 2.3*    Recent Labs Lab 02/22/14 1350  LIPASE 15   No results found for this basename: AMMONIA,  in the last 168 hours CBC:  Recent Labs Lab 02/22/14 1350 02/23/14 0239 02/23/14 1848 02/24/14 0451  WBC 15.9* 14.0* 11.6* 12.6*  NEUTROABS 14.4*  --   --   --   HGB 5.9* 8.1* 8.2* 8.1*  HCT 17.7* 24.5* 24.5* 24.4*  MCV 90.3 86.6 86.9 86.5  PLT 164 129*  120* 122*   Cardiac Enzymes:  Recent Labs Lab 02/22/14 1708  CKTOTAL 490*   BNP (last 3 results) No results found for this basename: PROBNP,  in the last 8760 hours CBG: No results found for this basename: GLUCAP,  in the last 168 hours  Recent Results (from the past 240 hour(s))  CULTURE, BLOOD (ROUTINE X 2)     Status: None   Collection Time    02/22/14  1:32 PM      Result Value Ref Range Status   Specimen Description BLOOD RIGHT ARM   Final   Special Requests BOTTLES DRAWN AEROBIC AND ANAEROBIC 8CC   Final   Culture  Setup Time     Final   Value: 02/22/2014 19:17     Performed at [facility]   Culture     Final   Value:        BLOOD CULTURE RECEIVED NO GROWTH TO DATE CULTURE WILL BE HELD FOR 5 DAYS BEFORE ISSUING A FINAL NEGATIVE REPORT     Performed at [facility]   Report Status PENDING   Incomplete  CULTURE, BLOOD (ROUTINE X 2)     Status: None   Collection Time    02/22/14  1:37 PM  Result Value Ref Range Status   Specimen Description BLOOD RIGHT HAND   Final   Special Requests BOTTLES DRAWN AEROBIC ONLY [facility]   Final   Culture  Setup Time     Final   Value: 02/22/2014 19:17     Performed at [facility]   Culture     Final   Value:        BLOOD CULTURE RECEIVED NO GROWTH TO DATE CULTURE WILL BE HELD FOR 5 DAYS BEFORE ISSUING A FINAL NEGATIVE REPORT     Performed at [facility]   Report Status PENDING   Incomplete  URINE CULTURE     Status: None   Collection Time    02/22/14  2:56 PM      Result Value Ref Range Status   Specimen Description URINE, CLEAN CATCH   Final   Special Requests NONE   Final   Culture  Setup Time     Final   Value: 02/22/2014 20:34     Performed at [facility] Count PENDING   Incomplete   Culture     Final   Value: Culture reincubated for better growth     Performed at [facility]   Report Status PENDING   Incomplete  MRSA PCR SCREENING     Status: None   Collection  Time    02/22/14  9:08 PM      Result Value Ref Range Status   MRSA by PCR NEGATIVE  NEGATIVE Final   Comment:            The GeneXpert MRSA Assay (FDA     approved for NASAL specimens     only), is one component of a     comprehensive MRSA colonization     surveillance program. It is not     intended to diagnose MRSA     infection nor to guide or     monitor treatment for     MRSA infections.     Studies:  Recent x-ray studies have been reviewed in detail by the Attending Physician  Time spent :     Junious Silk, ANP [facility] Office  [phone] Pager [phone]  **If unable to reach the above provider after paging please contact the Flow Manager @ [phone]  On-Call/Text Page:      [email or web address]      password [email or web address]  If 7PM-7AM, please contact night-coverage [email or web address] Password [email or web address] 02/24/2014, 1:18 PM   LOS: 2 days

## 2014-02-24 NOTE — Progress Notes (Signed)
NURSING PROGRESS NOTE  [name] [id number] Transfer Data: 02/24/2014 11:48 AM Attending Provider: Hollice EspySendil K Krishnan, MD WUJ:[id number]:HARRIS, Chrissie NoaWILLIAM, MD Code Status: full   [name] is a 80 y.o. female patient transferred to 5W26.  -No acute distress noted.  -No complaints of shortness of breath.  -No complaints of chest pain.   Cardiac Monitoring: Cardiac monitor yields:normal sinus rhythm.  Blood pressure 118/62, pulse 72, temperature 97.8 F (36.6 C), temperature source Oral, resp. rate 24, height 5\' 5"  (1.651 m), weight 78.7 kg (173 lb 8 oz), SpO2 95.00%.   IV Fluids:  IV in place, occlusive dsg intact without redness, IV cath hand left, condition patent and no redness D5W.   Allergies:  Review of patient's allergies indicates no known allergies.  Past Medical History:   has a past medical history of Hypertension; Hyperlipidemia; GERD (gastroesophageal reflux disease); Dysphagia; Chronic recurrent sinusitis; Recurrent UTI; Atrial fibrillation; Arthritis; TIA (transient ischemic attack); Anemia; and Hypothyroidism.  Past Surgical History:   has past surgical history that includes Pacemaker insertion; Back surgery (1985); Shoulder surgery (2008); Total knee arthroplasty (2000, 2001); Cataract extraction (1986, 2003); Vaginal hysterectomy (1969); Bilateral salpingoophorectomy (1971); Tonsillectomy (1951); Appendectomy (1952); Mastoidectomy (1970); Sinus exploration (1972); Knee arthroscopy (1988, 1995, 2000); Hemorrhoid surgery (2009); Repair / reconstruction interphalangeal joint (2004); Cystoscopy (1992); and Insert / replace / remove pacemaker.  Social History:   reports that she quit smoking about 32 years ago. Her smoking use included Cigarettes. She has a 15 pack-year smoking history. She has never used smokeless tobacco. She reports that she does not drink alcohol or use illicit drugs.  Skin: bruising throughout and pressure ulcers on sacrum.   Patient/Family  orientated to room. Information packet given to patient/family. Admission inpatient armband information verified with patient/family to include name and date of birth and placed on patient arm. Side rails up x 2, fall assessment and education completed with patient/family. Patient/family able to verbalize understanding of risk associated with falls and verbalized understanding to call for assistance before getting out of bed. Call light within reach. Patient/family able to voice and demonstrate understanding of unit orientation instructions.    [name](son) called and updated on room change.   Will continue to evaluate and treat per MD orders.

## 2014-02-24 NOTE — Progress Notes (Signed)
ANTICOAGULATION CONSULT NOTE - Follow Up Consult  Pharmacy Consult for Heparin  Indication: atrial fibrillation  No Known Allergies  Patient Measurements: Height: 5\' 5"  (165.1 cm) Weight: 173 lb 8 oz (78.7 kg) IBW/kg (Calculated) : 57  Vital Signs: Temp: 97.8 F (36.6 C) (03/24 0721) Temp src: Oral (03/24 0721) BP: 118/62 mmHg (03/24 1000) Pulse Rate: 72 (03/24 1000)  Labs:  Recent Labs  02/22/14 1350 02/22/14 1708 02/23/14 0239 02/23/14 1445 02/23/14 1848 02/23/14 2048 02/24/14 0451 02/24/14 1515  HGB 5.9*  --  8.1*  --  8.2*  --  8.1*  --   HCT 17.7*  --  24.5*  --  24.5*  --  24.4*  --   PLT 164  --  129*  --  120*  --  122*  --   APTT  --   --   --   --   --   --  168* 45*  LABPROT 19.0*  --   --   --   --   --   --   --   INR 1.64*  --   --   --   --   --   --   --   HEPARINUNFRC  --   --   --   --   --  >2.20* >2.20* 1.98*  CREATININE 4.70*  --  3.94* 3.38*  --   --  2.73*  --   CKTOTAL  --  490*  --   --   --   --   --   --     Estimated Creatinine Clearance: 17 ml/min (by C-G formula based on Cr of 2.73).   Assessment: 80 y/o F on heparin for afib. The patient was on apixaban PTA and initially we only ordered a HL but likely due to her renal dysfunction she was still retaining some apixaban that created a HL >2.2. APTT was ordered which is still high at 168, but doesn't correlate with the HL, so will use aPTT for now until correlation occurs. No issues per RN.   HL still elevated (still retaining apixaban) and PTT low at this time  Goal of Therapy:  Heparin level 0.3-0.7 units/ml aPTT 66-102 seconds Monitor platelets by anticoagulation protocol: Yes   Plan:  -Heparin to 1000 units / hr -8 hr heparin level, PTT  Thank you. Okey RegalLisa Aasiya Creasey, PharmD [phone] Elwin SleightPowell, Ileanna Gemmill Kay 02/24/2014,4:35 PM

## 2014-02-24 NOTE — Progress Notes (Signed)
Patient seen, examined and discussed with my nurse practitioner. Agree with above note. Hemoglobin remained stable after 2 units packed red blood cells. Managing conservatively. GI has signed off. Getting esophagram. Hopefully as her renal function and sodium improved, her mentation will as well.

## 2014-02-25 ENCOUNTER — Inpatient Hospital Stay (HOSPITAL_COMMUNITY): Payer: Medicare Other

## 2014-02-25 DIAGNOSIS — K922 Gastrointestinal hemorrhage, unspecified: Secondary | ICD-10-CM

## 2014-02-25 DIAGNOSIS — N19 Unspecified kidney failure: Secondary | ICD-10-CM

## 2014-02-25 DIAGNOSIS — R4182 Altered mental status, unspecified: Secondary | ICD-10-CM

## 2014-02-25 LAB — CBC
HCT: 24.7 % — ABNORMAL LOW (ref 36.0–46.0)
Hemoglobin: 8.2 g/dL — ABNORMAL LOW (ref 12.0–15.0)
MCH: 29 pg (ref 26.0–34.0)
MCHC: 33.2 g/dL (ref 30.0–36.0)
MCV: 87.3 fL (ref 78.0–100.0)
PLATELETS: 123 10*3/uL — AB (ref 150–400)
RBC: 2.83 MIL/uL — ABNORMAL LOW (ref 3.87–5.11)
RDW: 19 % — ABNORMAL HIGH (ref 11.5–15.5)
WBC: 12.3 10*3/uL — AB (ref 4.0–10.5)

## 2014-02-25 LAB — BASIC METABOLIC PANEL
BUN: 78 mg/dL — ABNORMAL HIGH (ref 6–23)
CO2: 23 mEq/L (ref 19–32)
Calcium: 8.3 mg/dL — ABNORMAL LOW (ref 8.4–10.5)
Chloride: 113 mEq/L — ABNORMAL HIGH (ref 96–112)
Creatinine, Ser: 1.71 mg/dL — ABNORMAL HIGH (ref 0.50–1.10)
GFR, EST AFRICAN AMERICAN: 33 mL/min — AB (ref >90)
GFR, EST NON AFRICAN AMERICAN: 28 mL/min — AB (ref >90)
Glucose, Bld: 174 mg/dL — ABNORMAL HIGH (ref 70–99)
Potassium: 2.5 mEq/L — CL (ref 3.7–5.3)
SODIUM: 149 meq/L — AB (ref 137–147)

## 2014-02-25 LAB — APTT
aPTT: 31 s (ref 24–37)
aPTT: 33 seconds (ref 24–37)

## 2014-02-25 LAB — HEPARIN LEVEL (UNFRACTIONATED): Heparin Unfractionated: 1.36 IU/mL — ABNORMAL HIGH (ref 0.30–0.70)

## 2014-02-25 MED ORDER — POTASSIUM CHLORIDE 2 MEQ/ML IV SOLN
INTRAVENOUS | Status: DC
  Administered 2014-02-25: 10:00:00 via INTRAVENOUS
  Filled 2014-02-25 (×2): qty 1000

## 2014-02-25 MED ORDER — DEXTROSE 5 % IV SOLN
INTRAVENOUS | Status: DC
  Administered 2014-02-25: 75 mL via INTRAVENOUS
  Administered 2014-02-26: 23:00:00 via INTRAVENOUS
  Administered 2014-02-26: 950 mL via INTRAVENOUS
  Administered 2014-02-27 (×2): via INTRAVENOUS

## 2014-02-25 MED ORDER — HEPARIN SODIUM (PORCINE) 5000 UNIT/ML IJ SOLN
5000.0000 [IU] | Freq: Three times a day (TID) | INTRAMUSCULAR | Status: DC
Start: 2014-02-25 — End: 2014-02-26
  Administered 2014-02-25 – 2014-02-26 (×4): 5000 [IU] via SUBCUTANEOUS
  Filled 2014-02-25 (×6): qty 1

## 2014-02-25 MED ORDER — POTASSIUM CHLORIDE 10 MEQ/100ML IV SOLN
10.0000 meq | INTRAVENOUS | Status: AC
  Administered 2014-02-25 (×4): 10 meq via INTRAVENOUS
  Filled 2014-02-25 (×5): qty 100

## 2014-02-25 MED ORDER — MORPHINE SULFATE 2 MG/ML IJ SOLN
1.0000 mg | INTRAMUSCULAR | Status: DC | PRN
  Administered 2014-02-25: 1 mg via INTRAVENOUS
  Filled 2014-02-25: qty 1

## 2014-02-25 MED ORDER — MUSCLE RUB 10-15 % EX CREA
TOPICAL_CREAM | CUTANEOUS | Status: DC | PRN
  Filled 2014-02-25: qty 85

## 2014-02-25 MED ORDER — HEPARIN (PORCINE) IN NACL 100-0.45 UNIT/ML-% IJ SOLN
1300.0000 [IU]/h | INTRAMUSCULAR | Status: DC
  Filled 2014-02-25: qty 250

## 2014-02-25 NOTE — Progress Notes (Signed)
ANTICOAGULATION CONSULT NOTE  Pharmacy Consult for Heparin  Indication: atrial fibrillation  No Known Allergies  Patient Measurements: Height: 5\' 5"  (165.1 cm) Weight: 173 lb 8 oz (78.7 kg) IBW/kg (Calculated) : 57  Vital Signs: Temp: 98.2 F (36.8 C) (03/24 2132) Temp src: Oral (03/24 2132) BP: 135/72 mmHg (03/24 2132) Pulse Rate: 77 (03/24 2132)  Labs:  Recent Labs  02/22/14 1350 02/22/14 1708 02/23/14 0239 02/23/14 1445 02/23/14 1848 02/23/14 2048 02/24/14 0451 02/24/14 1515 02/24/14 1910 02/25/14 0027  HGB 5.9*  --  8.1*  --  8.2*  --  8.1*  --  8.5*  --   HCT 17.7*  --  24.5*  --  24.5*  --  24.4*  --  25.8*  --   PLT 164  --  129*  --  120*  --  122*  --  122*  --   APTT  --   --   --   --   --   --  168* 45*  --  31  LABPROT 19.0*  --   --   --   --   --   --   --   --   --   INR 1.64*  --   --   --   --   --   --   --   --   --   HEPARINUNFRC  --   --   --   --   --  >2.20* >2.20* 1.98*  --   --   CREATININE 4.70*  --  3.94* 3.38*  --   --  2.73*  --   --   --   CKTOTAL  --  490*  --   --   --   --   --   --   --   --     Estimated Creatinine Clearance: 17 ml/min (by C-G formula based on Cr of 2.73).   Assessment: 80 yo female with Afib, apixaban on hold, for heparin  Goal of Therapy:  Heparin level 0.3-0.7 units/ml aPTT 66-102 seconds Monitor platelets by anticoagulation protocol: Yes   Plan:  Increase Heparin 1150 units/hr Follow-up am labs.   Jeweliana Dudgeon, Gary FleetGregory Vernon 02/25/2014,12:58 AM

## 2014-02-25 NOTE — Progress Notes (Signed)
Patient JY:NWGNFA:Hye R Derringer      DOB: 06/13/1931      OZH:[id number] The PMT has received your consult and will schedule as soon as possible.  Tyanne Derocher L. Ladona Ridgelaylor, MD MBA [facility] Phone: [phone] Pager: [phone]

## 2014-02-25 NOTE — Progress Notes (Signed)
ANTICOAGULATION CONSULT NOTE  Pharmacy Consult for Heparin  Indication: atrial fibrillation  No Known Allergies  Patient Measurements: Height: 5\' 5"  (165.1 cm) Weight: 177 lb 0.5 oz (80.3 kg) IBW/kg (Calculated) : 57 Heparin dosing wt: 73kg  Vital Signs: Temp: 97.8 F (36.6 C) (03/25 0455) Temp src: Oral (03/25 0455) BP: 121/52 mmHg (03/25 0455) Pulse Rate: 76 (03/25 0455)  Labs:  Recent Labs  02/22/14 1350 02/22/14 1708  02/23/14 1445  02/24/14 0451 02/24/14 1515 02/24/14 1910 02/25/14 0027 02/25/14 0745  HGB 5.9*  --   < >  --   < > 8.1*  --  8.5*  --  8.2*  HCT 17.7*  --   < >  --   < > 24.4*  --  25.8*  --  24.7*  PLT 164  --   < >  --   < > 122*  --  122*  --  123*  APTT  --   --   --   --   < > 168* 45*  --  31 33  LABPROT 19.0*  --   --   --   --   --   --   --   --   --   INR 1.64*  --   --   --   --   --   --   --   --   --   HEPARINUNFRC  --   --   --   --   < > >2.20* 1.98*  --   --  1.36*  CREATININE 4.70*  --   < > 3.38*  --  2.73*  --   --   --  1.71*  CKTOTAL  --  490*  --   --   --   --   --   --   --   --   < > = values in this interval not displayed.  Estimated Creatinine Clearance: 27.5 ml/min (by C-G formula based on Cr of 1.71).   Assessment: 80 yo female with Afib, apixaban on hold and on heparin. Heparin was increased to 1150 units/hr and aPTT with no change. Patient noted with Hg= 5.9 and FOBT positive on 3/22.  Goal of Therapy:  Heparin level 0.3-0.7 units/ml aPTT 66-102 seconds Monitor platelets by anticoagulation protocol: Yes   Plan:   -No heparin bolus due to recent low Hg  -Increase heparin to 1300 units/hr -Heparin level in 8hrs  Harland GermanAndrew Nikko Quast, Pharm D 02/25/2014 10:01 AM

## 2014-02-25 NOTE — Progress Notes (Signed)
PROGRESS NOTE  [name] ZOX:[id number] DOB: 05/22/1931 DOA: 02/22/2014 PCP: Johny BlamerHARRIS, WILLIAM, MD  Subjective: 80 y.o. Female with history of HTN, dysphagia, afib, TIA, and hypothyroidism admitted 3/22 with hypotension, anemia, dehydration and AMS. Today she is mildly responsive and aware that she is at a hospital.   Assessment/Plan: Active Problems:  Anemia - Suspected subacute blood loss - Likely secondary to a recent gastrointestinal bleeding-does have a history of diverticulosis. Was also on Eliquis prior to admission. Was admitted and given 2 units of PRBC, hemoglobin continues to be stable. - Given her poor overall clinical condition, lethargy, GI has deferred any invasive endoscopic procedures for now. - Patient has had numerous frequent falls in the past, given possible recent GI bleed-she's not a candidate for long-term anticoagulation. I have spoken at length with patient's son at bedside-[name], and he understands and accepts the risk of catastrophic from embolic phenomenon including stroke while off anticoagulation. For now would not even start antiplatelet agents. -Continue to monitor CBC.   Hypotension - Secondary to hypovolemic shock from dehydration, poor intake and recent also will GI bleed., resolved with IV fluids. - Blood pressure continues to be stable off all antihypertensive medications at this time.  Acute renal failure on chronic kidney disease stage III - Likely prerenal from dehydration, recent blood loss, diuretic and losartan use - Renal function slowly improving with IV fluids.  Suspected recent gastrointestinal bleeding - See above. - No overt bleeding observed  Metabolic encephalopathy - Likely secondary to electrolyte abnormalities, and renal failure. - No indication of infection at this point, he remains afebrile, UA negative for UTI, chest x-ray negative for pneumonia. CT head on 3/22 was negative for acute intracranial abnormality. -  Unfortunately, continues to be very lethargic, awakes briefly.  Atrial fibrillation - Significant bruising in her upper extremities, CT scan of the abdomen on 3/22 shows a small lateral left pectineus muscle hematoma, has had numerous recent falls, not a candidate for anticoagulation. - Have spoken with patient's son-[name]-we have agreed to even discontinue IV heparin while she is inpatient. -Rate controlled with atrial pacing 100%  - Sotalol, Coreg remain on hold. I do not plan on resuming Eliquis at this time.  Acute respiratory failure with hypoxia/recent outpatient treatment for bronchitis  -Chest x-ray at admission unremarkable the patient also profoundly dehydrated  -Hypoxemia could be primarily related to altered mentation and poor respiratory effort  -Stable on oxygen via nasal canula -ABG 3/23 revealed PO2 57 with unknown baseline   Hypernatremia - Secondary to dehydration, very poor oral intake. Now on D5W. Continue to monitor periodically.  Dehydration - Now on IV fluids. This is secondary to poor oral intake.  Chronic diastolic heart failure  -Compensated at present   Thrombocytopenia  -Onset after admission, due to acute illness -Follow closely  Hypokalemia  -Decreased from 3.1 to 2.5 on 3/25 -IV replete and follow labs   Hypothyroidism  -Convert oral medication to IV until proves can swallow   Dysphagia - Suspect secondary to acute illness and deconditioning. - Seen by speech therapy on 3/24, current recommendations are for full liquids. Patient's son Casimiro NeedleMichael agreeable with this plan, accepts all risks of aspiration. Does not desire invasive investigations, does not desire artificial nutrition in the form of a PEG tube.  History of CVA (cerebrovascular accident)  - Continues to be at risk for future CVA events-especially now since he is not a candidate for anticoagulation. Long discussion with son Casimiro NeedleMichael at bedside-see above. Agreeable to continue  to withhold  all forms of anticoagulation. Given recurrent falls, suspected GI bleed-no longer a candidate for anticoagulation.  Failure to thrive - Very weak, deconditioned, poor oral intake. Has significant medical comorbidities, and continues to be very encephalopathic. Very poor oral prognosis. Palliative care medicine consulted.  Pacemaker in place  DVT Prophylaxis:  Heparin - vte prophylatic dose- watch platelets.  Code Status: DNR Family Communication: Son and daughter in law at bedside. Disposition Plan: remain inpatient   Consultants:  GI  Palliative Medicine  Procedures:  Antibiotics:  none  Objective: Filed Vitals:   02/24/14 1000 02/24/14 2132 02/25/14 0455 02/25/14 1300  BP: 118/62 135/72 121/52 120/52  Pulse: 72 77 76 74  Temp:  98.2 F (36.8 C) 97.8 F (36.6 C) 98.9 F (37.2 C)  TempSrc:  Oral Oral Oral  Resp: 24 22 22 18   Height:      Weight:   80.3 kg (177 lb 0.5 oz)   SpO2: 95% 95% 95% 97%    Intake/Output Summary (Last 24 hours) at 02/25/14 1439 Last data filed at 02/25/14 1338  Gross per 24 hour  Intake   2175 ml  Output   2800 ml  Net   -625 ml   Filed Weights   02/23/14 0320 02/24/14 0435 02/25/14 0455  Weight: 75.6 kg (166 lb 10.7 oz) 78.7 kg (173 lb 8 oz) 80.3 kg (177 lb 0.5 oz)    Exam: General: appears stated age, not easily aroused, Lying comfortably in bed.  Decreased responsiveness HEENT:  Mouth is dry.  Cardiovascular: irreg irreg, no rubs, murmurs or gallops.   Respiratory: Clear to auscultation bilaterally with equal chest rise. No deep inspiration. Abdomen: obese, Soft, nontender, nondistended, + bowel sounds  Extremities: warm dry without cyanosis clubbing.  Skin: Extensive purple bruising on UE bilaterally.  Large area of hematoma on right upper thigh. Psych: AMS. Oriented to location.    Data Reviewed: Basic Metabolic Panel:  Recent Labs Lab 02/22/14 1350 02/23/14 0239 02/23/14 1445 02/24/14 0451 02/25/14 0745  NA 143  148* 151* 151* 149*  K 3.9 3.4* 3.1* 3.1* 2.5*  CL 101 110 115* 114* 113*  CO2 22 20 21 20 23   GLUCOSE 103* 101* 90 92 174*  BUN 159* 139* 128* 113* 78*  CREATININE 4.70* 3.94* 3.38* 2.73* 1.71*  CALCIUM 9.6 8.8 8.5 8.3* 8.3*   Liver Function Tests:  Recent Labs Lab 02/22/14 1350 02/24/14 0451  AST 43* 31  ALT 14 15  ALKPHOS 46 44  BILITOT 0.4 0.5  PROT 5.1* 4.9*  ALBUMIN 2.4* 2.3*    Recent Labs Lab 02/22/14 1350  LIPASE 15   CBC:  Recent Labs Lab 02/22/14 1350 02/23/14 0239 02/23/14 1848 02/24/14 0451 02/24/14 1910 02/25/14 0745  WBC 15.9* 14.0* 11.6* 12.6* 12.6* 12.3*  NEUTROABS 14.4*  --   --   --   --   --   HGB 5.9* 8.1* 8.2* 8.1* 8.5* 8.2*  HCT 17.7* 24.5* 24.5* 24.4* 25.8* 24.7*  MCV 90.3 86.6 86.9 86.5 87.2 87.3  PLT 164 129* 120* 122* 122* 123*   Cardiac Enzymes:  Recent Labs Lab 02/22/14 1708  CKTOTAL 490*     Recent Results (from the past 240 hour(s))  CULTURE, BLOOD (ROUTINE X 2)     Status: None   Collection Time    02/22/14  1:32 PM      Result Value Ref Range Status   Specimen Description BLOOD RIGHT ARM   Final   Special Requests BOTTLES  DRAWN AEROBIC AND ANAEROBIC 8CC   Final   Culture  Setup Time     Final   Value: 02/22/2014 19:17     Performed at [facility]   Culture     Final   Value:        BLOOD CULTURE RECEIVED NO GROWTH TO DATE CULTURE WILL BE HELD FOR 5 DAYS BEFORE ISSUING A FINAL NEGATIVE REPORT     Performed at [facility]   Report Status PENDING   Incomplete  CULTURE, BLOOD (ROUTINE X 2)     Status: None   Collection Time    02/22/14  1:37 PM      Result Value Ref Range Status   Specimen Description BLOOD RIGHT HAND   Final   Special Requests BOTTLES DRAWN AEROBIC ONLY [facility]   Final   Culture  Setup Time     Final   Value: 02/22/2014 19:17     Performed at [facility]   Culture     Final   Value:        BLOOD CULTURE RECEIVED NO GROWTH TO DATE CULTURE WILL BE HELD FOR 5 DAYS BEFORE  ISSUING A FINAL NEGATIVE REPORT     Performed at [facility]   Report Status PENDING   Incomplete  URINE CULTURE     Status: None   Collection Time    02/22/14  2:56 PM      Result Value Ref Range Status   Specimen Description URINE, CLEAN CATCH   Final   Special Requests NONE   Final   Culture  Setup Time     Final   Value: 02/22/2014 20:34     Performed at [facility] Count     Final   Value: >=100,000 COLONIES/ML     Performed at [facility]   Culture     Final   Value: Multiple bacterial morphotypes present, none predominant. Suggest appropriate recollection if clinically indicated.     Performed at [facility]   Report Status 02/24/2014 FINAL   Final  MRSA PCR SCREENING     Status: None   Collection Time    02/22/14  9:08 PM      Result Value Ref Range Status   MRSA by PCR NEGATIVE  NEGATIVE Final   Comment:            The GeneXpert MRSA Assay (FDA     approved for NASAL specimens     only), is one component of a     comprehensive MRSA colonization     surveillance program. It is not     intended to diagnose MRSA     infection nor to guide or     monitor treatment for     MRSA infections.     Studies: Dg Esophagus  02/25/2014   CLINICAL DATA:  Possible esophageal narrowing.  EXAM: ESOPHOGRAM/BARIUM SWALLOW  TECHNIQUE: Single contrast examination was performed using  thin barium.  FLUOROSCOPY TIME:  0 min 55 seconds.  COMPARISON:  CT ABD/PELV WO CM dated 02/22/2014  FINDINGS: Due to patient condition, examination was performed in the recumbent LPO position. Patient had difficulty drinking barium from a straw. Realtime and static evaluation of the esophagus shows no definite esophageal narrowing. Difficult to exclude narrowing at the gastroesophageal junction. A barium tablet was not administered due to patient condition. There are tertiary contractions. No definite fold thickening.  IMPRESSION: 1. Limited examination due to  patient  condition, as described above. 2. No definite esophageal narrowing. Difficult to exclude narrowing at the gastroesophageal junction. 3. Tertiary contractions are indicative of esophageal dysmotility.   Electronically Signed   By: Leanna Battles M.D.   On: 02/25/2014 11:21    Scheduled Meds: . acetaminophen-codeine  1 tablet Oral QHS   And  . codeine  30 mg Oral QHS  . diazepam  2 mg Intravenous QHS  . fluticasone  1 spray Each Nare Daily  . heparin subcutaneous  5,000 Units Subcutaneous 3 times per day  . ipratropium  1 spray Nasal q morning - 10a  . levothyroxine  12.5 mcg Intravenous Daily   Continuous Infusions: . dextrose        Curt Bears, PA-S Algis Downs, PA-C [facility] Pager [phone]. If 7PM-7AM, please contact night-coverage at [email or web address], password [facility] 02/25/2014, 2:39 PM  LOS: 3 days    Attending Patient seen and examined, agreed with the above assessment and plan. Above documentation has been reviewed, necessary changes have been made. Very poor overall prognosis, continues to be lethargic and encephalopathic. Hardly any by mouth intake for the past few days. Continue with supportive care, however would start having palliative care on board in case patient does not turn around in the next few days.  Windell Norfolk MD

## 2014-02-26 DIAGNOSIS — Z515 Encounter for palliative care: Secondary | ICD-10-CM

## 2014-02-26 DIAGNOSIS — D649 Anemia, unspecified: Secondary | ICD-10-CM

## 2014-02-26 DIAGNOSIS — Z8673 Personal history of transient ischemic attack (TIA), and cerebral infarction without residual deficits: Secondary | ICD-10-CM

## 2014-02-26 LAB — BASIC METABOLIC PANEL
BUN: 49 mg/dL — AB (ref 6–23)
CHLORIDE: 113 meq/L — AB (ref 96–112)
CO2: 25 meq/L (ref 19–32)
CREATININE: 1.13 mg/dL — AB (ref 0.50–1.10)
Calcium: 8.4 mg/dL (ref 8.4–10.5)
GFR calc Af Amer: 52 mL/min — ABNORMAL LOW (ref >90)
GFR calc non Af Amer: 45 mL/min — ABNORMAL LOW (ref >90)
Glucose, Bld: 144 mg/dL — ABNORMAL HIGH (ref 70–99)
POTASSIUM: 2.8 meq/L — AB (ref 3.7–5.3)
Sodium: 149 mEq/L — ABNORMAL HIGH (ref 137–147)

## 2014-02-26 LAB — CBC
HEMATOCRIT: 26.7 % — AB (ref 36.0–46.0)
Hemoglobin: 8.7 g/dL — ABNORMAL LOW (ref 12.0–15.0)
MCH: 28.4 pg (ref 26.0–34.0)
MCHC: 32.6 g/dL (ref 30.0–36.0)
MCV: 87.3 fL (ref 78.0–100.0)
Platelets: 116 10*3/uL — ABNORMAL LOW (ref 150–400)
RBC: 3.06 MIL/uL — ABNORMAL LOW (ref 3.87–5.11)
RDW: 19 % — ABNORMAL HIGH (ref 11.5–15.5)
WBC: 11.2 10*3/uL — AB (ref 4.0–10.5)

## 2014-02-26 MED ORDER — MORPHINE SULFATE (CONCENTRATE) 10 MG /0.5 ML PO SOLN
5.0000 mg | ORAL | Status: DC | PRN
  Administered 2014-02-26 (×2): 5 mg via ORAL
  Filled 2014-02-26 (×2): qty 0.5

## 2014-02-26 MED ORDER — POTASSIUM CHLORIDE 10 MEQ/100ML IV SOLN
10.0000 meq | INTRAVENOUS | Status: AC
  Administered 2014-02-26 (×4): 10 meq via INTRAVENOUS
  Filled 2014-02-26 (×4): qty 100

## 2014-02-26 MED ORDER — SENNA 8.6 MG PO TABS
2.0000 | ORAL_TABLET | Freq: Every day | ORAL | Status: DC
  Administered 2014-02-26: 17.2 mg via ORAL
  Filled 2014-02-26 (×2): qty 2

## 2014-02-26 MED ORDER — FLEET ENEMA 7-19 GM/118ML RE ENEM
1.0000 | ENEMA | Freq: Every day | RECTAL | Status: DC | PRN
  Filled 2014-02-26: qty 1

## 2014-02-26 NOTE — Progress Notes (Signed)
PT Cancellation Note  Patient Details Name: [name] MRN: [id number] DOB: 01/25/1931   Cancelled Treatment:     Pt's chart reviewed, discussed with RN. Pt barely responsive, unable to assist nursing with bed mobility, not appropriate for PT services at this time.   Tegan Britain 02/26/2014, 9:37 AM

## 2014-02-26 NOTE — Care Management Note (Signed)
    Page 1 of 2   03/02/2014     6:15:31 PM   CARE MANAGEMENT NOTE 03/02/2014  Patient:  [name],[name]   Account Number:  [id number]  Date Initiated:  02/23/2014  Documentation initiated by:  Donn PieriniWEBSTER,KRISTI  Subjective/Objective Assessment:   Pt admitted with hypotension, AMS, weakness     Action/Plan:   PTA Pt lived at home with family- NCM to follow for d/c needs   Anticipated DC Date:  02/28/2014   Anticipated DC Plan:  HOME W HOSPICE CARE  In-house referral  Clinical Social Worker      DC Associate Professorlanning Services  CM consult      PAC Choice  HOSPICE   Choice offered to / List presented to:  C-2 HC POA / Guardian        HH arranged  HH-1 RN      [facility] [place]   Status of service:  Completed, signed off Medicare Important Message given?   (If response is "NO", the following Medicare IM given date fields will be blank) Date Medicare IM given:   Date Additional Medicare IM given:    Discharge Disposition:  HOME W HOSPICE CARE  Per UR Regulation:  Reviewed for med. necessity/level of care/duration of stay  If discussed at Long Length of Stay Meetings, dates discussed:    Comments:  02/26/14 1118 Bridget Capeeborah Chantale Leugers RN, BSN [phone] NCM received referral from [facility] with Palliative, that patient would like home with hospice, son Ian BushmanMichael Whitehead stated [facility],  his phone number is [phone],  he would be the one to accept any delivery of DME as well.  Patient would need ambulance for transport at dc.  Patient has a wheelchair at home and a bsc.  Patient is on oxygen while here in hospital so will need oxygne package.  NCM gave referral to [facility] at [phone] and also call Margie with [facility].  Patient is not for dc today, son Ian BushmanMichael Whitehead states he gets off work at 4 pm on Friday and would like to have dme delivered after 4 pm . Hospice will have an RN ready to see patient on Saturday.

## 2014-02-26 NOTE — Progress Notes (Signed)
CRITICAL VALUE ALERT  Critical value received:  K 2.8  Date of notification:  3/26  Time of notification:  0927  Critical value read back:yes  Nurse who received alert:  Wonda AmisKrystal Kailiana Granquist  MD notified (1st page):  Ghimire  Time of first page:  930  Responding MD:  Jerral RalphGhimire  Time MD responded:  930

## 2014-02-26 NOTE — Consult Note (Signed)
Patient JK:DTOIZT R Ropp      DOB: 1930-12-19      IWP:[id number]     Consult Note from the Palliative Medicine Team at [facility] Requested by: Erin Hearing, NP   PCP: Shirline Frees, MD Reason for Consultation: [place] and options   Phone Number:[phone]  Assessment of patients Current state: Ms. Bermingham is a 80 yo female admitted with weakness and found to be anemic (likely recent GI bleed with hx diverticulosis) with dehydration with acute renal failure and hypotension. She also has had frequent falls and dysphagia with failure to thrive currently. She has PMH of atrial fibrillation (taken off anticoagulant d/t frequent falls), diastolic CHF, CVA, pacemaker.   I met today with Ms. Allmendinger's son, Legrand Como [phone], [phone]) whom she has lived with for the past several years with Legrand Como functioning as her caregiver. He works from home so is there with here other than the occasional meeting. He tells me that she has been much weaker and has progressed from walker, to wheelchair bound, and then to not being able to even transfer to and from wheelchair. Legrand Como has also said that he has noticed she has been sleeping more often to the point of noticing the beginning of a pressure ulcer. She has not been eating for ~1 week now but had a fair appetite prior to this illness. He also reports that she did get choked and cough when eating at times. We discussed her dysphagia and he asked about MBSS suggested by SLP and we decided that this might be beneficial if she becomes alert enough to participate. Legrand Como tells me that he understands that his mother has little time left and that she expressed her wishes to him even though she never placed anything in writing. Legrand Como wishes to take her home with hospice support. I completed golden DNR and MOST form: Legrand Como wishes for DNR, comfort measures (no return to hospital), consider antibiotics, no IVF and no feeding tube. We will also be  looking into supplementing hospice with out of pocket caregivers. I will continue to follow and support Ms. Mcmahen and her family.    Goals of Care: 1.  Code Status: DNR   2. Scope of Treatment: 1. Vital Signs: Routine  2. Respiratory/Oxygen: yes 3. Nutritional Support/Tube Feeds: no 4. Antibiotics: yes  5. IVF: yes - while hospitalized 6. Review of Medications to be discontinued: Minimize for comfort. 7. Consults: Case management for hospice referral.    4. Disposition: Home with hospice.    3. Symptom Management:   1. Pain: Roxanol prn.  2. Bowel Regimen: Senna 2 tablets and fleet enema daily prn.  3. Nausea/Vomiting: Ondansetron prn.   4. Psychosocial: Emotional support provided to patient and family during difficult conversation.   Patient Documents Completed or Given: Document Given Completed  Advanced Directives Pkt    MOST  yes  DNR  yes  Gone from My Sight    Hard Choices      Brief HPI: 80 yo female admitted with weakness and found to be anemic (likely recent GI bleed with hx diverticulosis) with dehydration with acute renal failure and hypotension. She also has had frequent falls and dysphagia with failure to thrive currently. She has PMH of atrial fibrillation (taken off anticoagulant d/t frequent falls), diastolic CHF, CVA, pacemaker.    ROS: Unable to assess - altered mental status    PMH:  Past Medical History  Diagnosis Date  . Hypertension   . Hyperlipidemia   .  GERD (gastroesophageal reflux disease)   . Dysphagia     appt with Herbie [place] Buccini upcoming  . Chronic recurrent sinusitis   . Recurrent UTI   . Atrial fibrillation     ???  . Arthritis   . TIA (transient ischemic attack)   . Anemia   . Hypothyroidism      PSH: Past Surgical History  Procedure Laterality Date  . Pacemaker insertion    . Back surgery  1985  . Shoulder surgery  2008    Left  (Dr. Veverly Fells)  . Total knee arthroplasty  2000, 2001    bilateral (Dr. Maureen Ralphs),  left 2000, right 2001  . Cataract extraction  1986, 2003    left 1986, right 2003  . Vaginal hysterectomy  1969  . Bilateral salpingoophorectomy  1971  . Tonsillectomy  1951  . Appendectomy  1952  . Mastoidectomy  1970  . Sinus exploration  1972  . Knee arthroscopy  1988, 1995, 2000  . Hemorrhoid surgery  2009  . Repair / reconstruction interphalangeal joint  2004    R thumb  . Cystoscopy  1992  . Insert / replace / remove pacemaker     I have reviewed the [place] and SH and  If appropriate update it with new information. No Known Allergies Scheduled Meds: . acetaminophen-codeine  1 tablet Oral QHS   And  . codeine  30 mg Oral QHS  . diazepam  2 mg Intravenous QHS  . fluticasone  1 spray Each Nare Daily  . heparin subcutaneous  5,000 Units Subcutaneous 3 times per day  . ipratropium  1 spray Nasal q morning - 10a  . levothyroxine  12.5 mcg Intravenous Daily   Continuous Infusions: . dextrose 75 mL (02/25/14 1917)   PRN Meds:.morphine injection, MUSCLE RUB, ondansetron (ZOFRAN) IV, ondansetron    BP 135/74  Pulse 77  Temp(Src) 98.5 F (36.9 C) (Oral)  Resp 22  Ht _0  (1.651 m)  Wt 78.7 kg (173 lb 8 oz)  BMI 28.87 kg/m2  SpO2 94%   PPS: 20%   Intake/Output Summary (Last 24 hours) at 02/26/14 1120 Last data filed at 02/26/14 0900  Gross per 24 hour  Intake      0 ml  Output   1275 ml  Net  -1275 ml   LBM: Prior to admission                        Physical Exam:  General: NAD, lethargic, obese HEENT: [place]/AT, no JVD, dry mucous membranes Chest: Clear throughout, slightly labored - oxygen administered CVS: RRR, S1 S2 Abdomen: Soft, NT, ND, hypoactive BS Ext: BLE trace edema, frail/thin/dry skin, ecchymosis BUE Neuro: lethargic, nods "yes" or "no" to questions appropriately  Labs: CBC    Component Value Date/Time   WBC 11.2* 02/26/2014 0800   RBC 3.06* 02/26/2014 0800   HGB 8.7* 02/26/2014 0800   HCT 26.7* 02/26/2014 0800   PLT 116* 02/26/2014 0800   MCV  87.3 02/26/2014 0800   MCH 28.4 02/26/2014 0800   MCHC 32.6 02/26/2014 0800   RDW 19.0* 02/26/2014 0800   LYMPHSABS 1.0 02/22/2014 1350   MONOABS 0.5 02/22/2014 1350   EOSABS 0.0 02/22/2014 1350   BASOSABS 0.0 02/22/2014 1350    BMET    Component Value Date/Time   NA 149* 02/26/2014 0800   K 2.8* 02/26/2014 0800   CL 113* 02/26/2014 0800   CO2 25 02/26/2014 0800   GLUCOSE 144*  02/26/2014 0800   BUN 49* 02/26/2014 0800   CREATININE 1.13* 02/26/2014 0800   CALCIUM 8.4 02/26/2014 0800   GFRNONAA 45* 02/26/2014 0800   GFRAA 52* 02/26/2014 0800    CMP     Component Value Date/Time   NA 149* 02/26/2014 0800   K 2.8* 02/26/2014 0800   CL 113* 02/26/2014 0800   CO2 25 02/26/2014 0800   GLUCOSE 144* 02/26/2014 0800   BUN 49* 02/26/2014 0800   CREATININE 1.13* 02/26/2014 0800   CALCIUM 8.4 02/26/2014 0800   PROT 4.9* 02/24/2014 0451   ALBUMIN 2.3* 02/24/2014 0451   AST 31 02/24/2014 0451   ALT 15 02/24/2014 0451   ALKPHOS 44 02/24/2014 0451   BILITOT 0.5 02/24/2014 0451   GFRNONAA 45* 02/26/2014 0800   GFRAA 52* 02/26/2014 0800     Time In Time Out Total Time Spent with Patient Total Overall Time  0950 1110 22mn 861m    Greater than 50%  of this time was spent counseling and coordinating care related to the above assessment and plan.  AlVinie SillNP Palliative Medicine Team Pager # [phone]-F 8a-5p) Team Phone # [phone]/Weekends)

## 2014-02-26 NOTE — Progress Notes (Signed)
Notified by Lorelee Marketeborah CMRN, patient and family request services of [facility] after discharge.   Spoke with pt's son Ian BushmanMichael Duncan to initiate education related to hospice services, philosophy and team approach to care, [name] voiced good understanding of information provided. [name] was tearful during this discussion stating he has cared for his mother for several years but only recently has she declined -he stated he is aware of her increased physical care and would want a list of private caregiver agencies from the CM as he feels he will need increased help - he is also [facility] Per discussion plan to discharge Saturday by PTAR; pt's son has work commitment and unable to have DME to the home until Friday after 4pm [facility] Assessment nurse is available to see pt by 2 pm if discharge can be co-ordinated to allow pt to leave between 12-1 pm *Please leave foley catheter in for comfort at discharge *Please send Liquid Concentrated Morphine Rx home with pt- please be sure Rx includes "Dispense Quantity = 30ml"  *Please send completed GOLD DNR form home with pt  DME needs discussed the following equipment was requested:  1) Complete O 2 Package B pt currently on O2 @ [facility]; and  2)Complete Package D: fully electric hospital bed with AP&P mattress, full rails and over-bed table *Please call son [name] @ [phone] to arrange for delivery of equipment  [facility]  Please notify [facility] when patient is ready to leave unit at d/c call [phone] on weekend;  [facility] information and contact numbers also given to [name]  during visit.   Above information shared with [facility] Please call with any questions or concerns  Valente DavidMargie Cobey Raineri, RN 02/26/2014, 2:28 PM [facility] RN Liaison [phone]

## 2014-02-26 NOTE — Progress Notes (Signed)
PATIENT DETAILS Name: [name] Age: 80 y.o. Sex: female Date of Birth: 03-13-1931 Admit Date: 02/22/2014 Admitting Physician Yevonne Pax, MD [id number], Chrissie Noa, MD  Subjective: Still very lethargic.  Assessment/Plan: Anemia  - Suspected subacute blood loss  - Likely secondary to a recent gastrointestinal bleeding-does have a history of diverticulosis. Was also on Eliquis prior to admission. Was admitted and given 2 units of PRBC, hemoglobin continues to be stable.  - Given her poor overall clinical condition, lethargy, GI has deferred any invasive endoscopic procedures for now.  - Patient has had numerous frequent falls in the past, given possible recent GI bleed-she's not a candidate for long-term anticoagulation. I have spoken at length with patient's son at bedside-[name], and he understands and accepts the risk of catastrophic from embolic phenomenon including stroke while off anticoagulation. For now would not even start antiplatelet agents.  -Continue to monitor CBC.   Hypotension  - Secondary to hypovolemic shock from dehydration, poor intake and recent also will GI bleed., resolved with IV fluids.  - Blood pressure continues to be stable off all antihypertensive medications at this time.   Acute renal failure on chronic kidney disease stage III  - Likely prerenal from dehydration, recent blood loss, diuretic and losartan use  - Renal function slowly improving with IV fluids.   Suspected recent gastrointestinal bleeding  - See above.  - No overt bleeding observed   Metabolic encephalopathy  - Likely secondary to electrolyte abnormalities, and renal failure.  - No indication of infection at this point, he remains afebrile, UA negative for UTI, chest x-ray negative for pneumonia. CT head on 3/22 was negative for acute intracranial abnormality.  - Unfortunately, continues to be very lethargic, awakes briefly. Unfortunately hard any oral intake over the past  few days  Atrial fibrillation  - Significant bruising in her upper extremities, CT scan of the abdomen on 3/22 shows a small lateral left pectineus muscle hematoma, has had numerous recent falls, not a candidate for anticoagulation.  - Have spoken with patient's son-[name]-we have agreed to even discontinue IV heparin while she is inpatient. Await palliative care evaluation. -Rate controlled with atrial pacing 100%  - Sotalol, Coreg remain on hold. I do not plan on resuming Eliquis at this time.   Acute respiratory failure with hypoxia/recent outpatient treatment for bronchitis  -Chest x-ray at admission unremarkable the patient also profoundly dehydrated  -Hypoxemia could be primarily related to altered mentation and poor respiratory effort  -Stable on oxygen via nasal canula  -ABG 3/23 revealed PO2 57 with unknown baseline   Hypernatremia  - Secondary to dehydration, very poor oral intake. Now on D5W. Continue to monitor periodically.   Dehydration  - Now on IV fluids. This is secondary to poor oral intake.   Chronic diastolic heart failure  -Compensated at present   Thrombocytopenia  -Onset after admission, due to acute illness  -Follow closely   Hypokalemia  - Secondary to poor oral intake, continue to replete and recheck.  Hypothyroidism  -Convert oral medication to IV until proves can swallow   Dysphagia  - Suspect secondary to acute illness and deconditioning.  - Seen by speech therapy on 3/24, current recommendations are for full liquids. Patient's son [name] agreeable with this plan, accepts all risks of aspiration. Does not desire invasive investigations, does not desire artificial nutrition in the form of a PEG tube.   History of CVA (cerebrovascular accident)  - Continues to be  at risk for future CVA events-especially now since he is not a candidate for anticoagulation. Long discussion with son [name] at bedside-see above. Agreeable to continue to withhold all  forms of anticoagulation. Given recurrent falls, suspected GI bleed-no longer a candidate for anticoagulation.   Failure to thrive  - Very weak, deconditioned, poor oral intake. Has significant medical comorbidities, and continues to be very encephalopathic. Very poor oral prognosis. Palliative care medicine consulted- current tentative plans are for home with hospice in the next 1-2 days. Per my discussion with the palliative care team, focus on comfort.  Pacemaker in place   DVT Prophylaxis:  Stop heparin-platelets are decreasing  Disposition: Remain inpatient  DVT Prophylaxis: Prophylactic Heparin   Code Status:  DNR  Family Communication Son at bedside  Procedures:  None  CONSULTS:  GI  Time spent 40 minutes-which includes 50% of the time with face-to-face with patient/ family and coordinating care related to the above assessment and plan.    MEDICATIONS: Scheduled Meds: . acetaminophen-codeine  1 tablet Oral QHS   And  . codeine  30 mg Oral QHS  . diazepam  2 mg Intravenous QHS  . fluticasone  1 spray Each Nare Daily  . heparin subcutaneous  5,000 Units Subcutaneous 3 times per day  . ipratropium  1 spray Nasal q morning - 10a  . levothyroxine  12.5 mcg Intravenous Daily  . potassium chloride  10 mEq Intravenous Q1 Hr x 4   Continuous Infusions: . dextrose 75 mL (02/25/14 1917)   PRN Meds:.morphine CONCENTRATE, MUSCLE RUB, ondansetron (ZOFRAN) IV, ondansetron  Antibiotics: Anti-infectives   None       PHYSICAL EXAM: Vital signs in last 24 hours: Filed Vitals:   02/25/14 1300 02/25/14 2111 02/26/14 0423 02/26/14 1300  BP: 120/52 132/84 135/74 120/84  Pulse: 74 80 77 145  Temp: 98.9 F (37.2 C) 97.4 F (36.3 C) 98.5 F (36.9 C) 98.8 F (37.1 C)  TempSrc: Oral Oral Oral Axillary  Resp: 18 24 22 22   Height:      Weight:   78.7 kg (173 lb 8 oz)   SpO2: 97% 94% 94% 98%    Weight change: -1.6 kg (-3 lb 8.4 oz) Filed Weights   02/24/14 0435  02/25/14 0455 02/26/14 0423  Weight: 78.7 kg (173 lb 8 oz) 80.3 kg (177 lb 0.5 oz) 78.7 kg (173 lb 8 oz)   Body mass index is 28.87 kg/(m^2).   Gen Exam: Very lethargic, awakens briefly.   Neck: Supple, No JVD.   Chest: B/L Clear.   CVS: S1 S2 Regular, no murmurs.  Abdomen: soft, BS +, non tender, non distended.  Extremities: no edema, lower extremities warm to touch. Neurologic: Non Focal.   Skin: Skin see bruising in her upper thighs, extensive bruising in her bilateral upper extremities.  Wounds: N/A.   Intake/Output from previous day:  Intake/Output Summary (Last 24 hours) at 02/26/14 1548 Last data filed at 02/26/14 0900  Gross per 24 hour  Intake      0 ml  Output    925 ml  Net   -925 ml     LAB RESULTS: CBC  Recent Labs Lab 02/22/14 1350  02/23/14 1848 02/24/14 0451 02/24/14 1910 02/25/14 0745 02/26/14 0800  WBC 15.9*  < > 11.6* 12.6* 12.6* 12.3* 11.2*  HGB 5.9*  < > 8.2* 8.1* 8.5* 8.2* 8.7*  HCT 17.7*  < > 24.5* 24.4* 25.8* 24.7* 26.7*  PLT 164  < > 120* 122* 122*  123* 116*  MCV 90.3  < > 86.9 86.5 87.2 87.3 87.3  MCH 30.1  < > 29.1 28.7 28.7 29.0 28.4  MCHC 33.3  < > 33.5 33.2 32.9 33.2 32.6  RDW 17.6*  < > 19.4* 19.2* 19.1* 19.0* 19.0*  LYMPHSABS 1.0  --   --   --   --   --   --   MONOABS 0.5  --   --   --   --   --   --   EOSABS 0.0  --   --   --   --   --   --   BASOSABS 0.0  --   --   --   --   --   --   < > = values in this interval not displayed.  Chemistries   Recent Labs Lab 02/23/14 0239 02/23/14 1445 02/24/14 0451 02/25/14 0745 02/26/14 0800  NA 148* 151* 151* 149* 149*  K 3.4* 3.1* 3.1* 2.5* 2.8*  CL 110 115* 114* 113* 113*  CO2 20 21 20 23 25   GLUCOSE 101* 90 92 174* 144*  BUN 139* 128* 113* 78* 49*  CREATININE 3.94* 3.38* 2.73* 1.71* 1.13*  CALCIUM 8.8 8.5 8.3* 8.3* 8.4    CBG: No results found for this basename: GLUCAP,  in the last 168 hours  GFR Estimated Creatinine Clearance: 41.2 ml/min (by C-G formula based on Cr  of 1.13).  Coagulation profile  Recent Labs Lab 02/22/14 1350  INR 1.64*    Cardiac Enzymes No results found for this basename: CK, CKMB, TROPONINI, MYOGLOBIN,  in the last 168 hours  No components found with this basename: POCBNP,  No results found for this basename: DDIMER,  in the last 72 hours No results found for this basename: HGBA1C,  in the last 72 hours No results found for this basename: CHOL, HDL, LDLCALC, TRIG, CHOLHDL, LDLDIRECT,  in the last 72 hours No results found for this basename: TSH, T4TOTAL, FREET3, T3FREE, THYROIDAB,  in the last 72 hours No results found for this basename: VITAMINB12, FOLATE, FERRITIN, TIBC, IRON, RETICCTPCT,  in the last 72 hours No results found for this basename: LIPASE, AMYLASE,  in the last 72 hours  Urine Studies No results found for this basename: UACOL, UAPR, USPG, UPH, UTP, UGL, UKET, UBIL, UHGB, UNIT, UROB, ULEU, UEPI, UWBC, URBC, UBAC, CAST, CRYS, UCOM, BILUA,  in the last 72 hours  MICROBIOLOGY: Recent Results (from the past 240 hour(s))  CULTURE, BLOOD (ROUTINE X 2)     Status: None   Collection Time    02/22/14  1:32 PM      Result Value Ref Range Status   Specimen Description BLOOD RIGHT ARM   Final   Special Requests BOTTLES DRAWN AEROBIC AND ANAEROBIC 8CC   Final   Culture  Setup Time     Final   Value: 02/22/2014 19:17     [facility]   Culture     Final   Value:        BLOOD CULTURE RECEIVED NO GROWTH TO DATE CULTURE WILL BE HELD FOR 5 DAYS BEFORE ISSUING A FINAL NEGATIVE REPORT     Performed at [facility]   Report Status PENDING   Incomplete  CULTURE, BLOOD (ROUTINE X 2)     Status: None   Collection Time    02/22/14  1:37 PM      Result Value Ref Range Status   Specimen Description BLOOD RIGHT  HAND   Final   Special Requests BOTTLES DRAWN AEROBIC ONLY [facility]   Final   Culture  Setup Time     Final   Value: 02/22/2014 19:17     Performed at [facility]   Culture     Final    Value:        BLOOD CULTURE RECEIVED NO GROWTH TO DATE CULTURE WILL BE HELD FOR 5 DAYS BEFORE ISSUING A FINAL NEGATIVE REPORT     Performed at [facility]   Report Status PENDING   Incomplete  URINE CULTURE     Status: None   Collection Time    02/22/14  2:56 PM      Result Value Ref Range Status   Specimen Description URINE, CLEAN CATCH   Final   Special Requests NONE   Final   Culture  Setup Time     Final   Value: 02/22/2014 20:34     Performed at [facility] Count     Final   Value: >=100,000 COLONIES/ML     Performed at [facility]   Culture     Final   Value: Multiple bacterial morphotypes present, none predominant. Suggest appropriate recollection if clinically indicated.     Performed at [facility]   Report Status 02/24/2014 FINAL   Final  MRSA PCR SCREENING     Status: None   Collection Time    02/22/14  9:08 PM      Result Value Ref Range Status   MRSA by PCR NEGATIVE  NEGATIVE Final   Comment:            The GeneXpert MRSA Assay (FDA     approved for NASAL specimens     only), is one component of a     comprehensive MRSA colonization     surveillance program. It is not     intended to diagnose MRSA     infection nor to guide or     monitor treatment for     MRSA infections.    RADIOLOGY STUDIES/RESULTS: Ct Abdomen Pelvis Wo Contrast  02/22/2014   CLINICAL DATA:  80 year old female with weakness, low hemoglobin and abdominal and pelvic pain.  EXAM: CT ABDOMEN AND PELVIS WITHOUT CONTRAST  TECHNIQUE: Multidetector CT imaging of the abdomen and pelvis was performed following the standard protocol without intravenous contrast.  COMPARISON:  06/12/2012 and prior CTs  FINDINGS: Coronary artery calcifications and mild bibasilar atelectasis/ scarring again noted.  Hepatic cyst is present.  The spleen, pancreas and adrenal glands are unremarkable.  Mild pericholecystic stranding is noted.  Bilateral renal atrophy is identified  with right renal cysts. A 1.6 cm indeterminate hyperdense lesion within left kidney is again noted.  Please note that parenchymal abnormalities may be missed without intravenous contrast.  Patient status post hysterectomy.  Colonic diverticulosis without evidence of diverticulitis.  There is no evidence of bowel obstruction, pneumoperitoneum or definite abscess.  The bladder is unremarkable.  A 1.3 x 1.6 cm hyperdense area along the lateral left pectineus muscle could represent a small hematoma.  No acute or suspicious bony abnormalities are identified. A severe lumbar scoliosis degenerative changes are noted.  IMPRESSION: Mild pericholecystic stranding of uncertain significance. Acute cholecystitis is not excluded and consider ultrasound evaluation as clinically indicated.  1.3 x 1.6 cm possible hematoma along the left pectineus muscle.  Bilateral renal atrophy and indeterminate 1.6 cm left renal lesion noted. Consider initial evaluation with ultrasound  if patient has renal insufficiency.   Electronically Signed   By: Laveda Abbe M.D.   On: 02/22/2014 17:35   Ct Head Wo Contrast  02/22/2014   CLINICAL DATA:  80 year old female with weakness and lethargy  EXAM: CT HEAD WITHOUT CONTRAST  TECHNIQUE: Contiguous axial images were obtained from the base of the skull through the vertex without intravenous contrast.  COMPARISON:  10/08/2013 and prior CTs  FINDINGS: Atrophy and chronic small-vessel white matter ischemic changes are again identified.  No acute intracranial abnormalities are identified, including mass lesion or mass effect, hydrocephalus, extra-axial fluid collection, midline shift, hemorrhage, or acute infarction. The visualized bony calvarium is unremarkable  Surgical chronic changes of the right maxillary sinus are noted.  IMPRESSION: No evidence of acute intracranial abnormality.  Atrophy and chronic small-vessel white matter ischemic changes.   Electronically Signed   By: Laveda Abbe M.D.   On: 02/22/2014  16:34   Dg Esophagus  02/25/2014   CLINICAL DATA:  Possible esophageal narrowing.  EXAM: ESOPHOGRAM/BARIUM SWALLOW  TECHNIQUE: Single contrast examination was performed using  thin barium.  FLUOROSCOPY TIME:  0 min 55 seconds.  COMPARISON:  CT ABD/PELV WO CM dated 02/22/2014  FINDINGS: Due to patient condition, examination was performed in the recumbent LPO position. Patient had difficulty drinking barium from a straw. Realtime and static evaluation of the esophagus shows no definite esophageal narrowing. Difficult to exclude narrowing at the gastroesophageal junction. A barium tablet was not administered due to patient condition. There are tertiary contractions. No definite fold thickening.  IMPRESSION: 1. Limited examination due to patient condition, as described above. 2. No definite esophageal narrowing. Difficult to exclude narrowing at the gastroesophageal junction. 3. Tertiary contractions are indicative of esophageal dysmotility.   Electronically Signed   By: Leanna Battles M.D.   On: 02/25/2014 11:21   Dg Chest Port 1 View  02/22/2014   CLINICAL DATA:  Weakness, hypertension, former smoker, history pacemaker, atrial fibrillation  EXAM: PORTABLE CHEST - 1 VIEW  COMPARISON:  Portable exam 1344 hr compared to 10/08/2013  FINDINGS: Right subclavian sequential transvenous pacemaker leads project over right atrium and right ventricle.  Enlargement of cardiac silhouette with pulmonary vascular congestion.  Stable mediastinal contours.  Chronic elevation of right diaphragm.  No gross infiltrate, pleural effusion or pneumothorax.  Bones demineralized.  : Enlargement of cardiac silhouette post pacemaker.  No definite acute abnormalities.   Electronically Signed   By: Ulyses Southward M.D.   On: 02/22/2014 14:14    Jeoffrey Massed, MD  [facility] Pager:[phone]  If 7PM-7AM, please contact night-coverage [email or web address] Password [email or web address] 02/26/2014, 3:48 PM   LOS: 4 days

## 2014-02-27 LAB — BASIC METABOLIC PANEL
BUN: 43 mg/dL — ABNORMAL HIGH (ref 6–23)
CALCIUM: 8.5 mg/dL (ref 8.4–10.5)
CHLORIDE: 108 meq/L (ref 96–112)
CO2: 26 mEq/L (ref 19–32)
Creatinine, Ser: 1.15 mg/dL — ABNORMAL HIGH (ref 0.50–1.10)
GFR calc Af Amer: 51 mL/min — ABNORMAL LOW (ref >90)
GFR calc non Af Amer: 44 mL/min — ABNORMAL LOW (ref >90)
GLUCOSE: 118 mg/dL — AB (ref 70–99)
Potassium: 3.5 mEq/L — ABNORMAL LOW (ref 3.7–5.3)
Sodium: 144 mEq/L (ref 137–147)

## 2014-02-27 MED ORDER — BISACODYL 10 MG RE SUPP
10.0000 mg | RECTAL | Status: DC
  Filled 2014-02-27: qty 1

## 2014-02-27 MED ORDER — DIAZEPAM 1 MG/ML PO SOLN: 5.0000 mg | Freq: Every day | ORAL | Status: DC

## 2014-02-27 MED ORDER — CARVEDILOL 3.125 MG PO TABS
3.1250 mg | ORAL_TABLET | Freq: Two times a day (BID) | ORAL | Status: DC
  Filled 2014-02-27 (×4): qty 1

## 2014-02-27 MED ORDER — MORPHINE SULFATE (CONCENTRATE) 10 MG /0.5 ML PO SOLN
5.0000 mg | Freq: Three times a day (TID) | ORAL | Status: DC
  Administered 2014-02-27 – 2014-02-28 (×4): 5 mg via SUBLINGUAL
  Filled 2014-02-27 (×4): qty 0.5

## 2014-02-27 MED ORDER — DIAZEPAM 2 MG PO TABS: 2.0000 mg | ORAL_TABLET | Freq: Three times a day (TID) | ORAL | Status: DC | PRN

## 2014-02-27 MED ORDER — BISACODYL 10 MG RE SUPP: 10.0000 mg | Freq: Every day | RECTAL | Status: DC | PRN

## 2014-02-27 MED ORDER — DIAZEPAM 1 MG/ML PO SOLN: 2.0000 mg | Freq: Three times a day (TID) | ORAL | Status: DC | PRN

## 2014-02-27 MED ORDER — DIAZEPAM 5 MG PO TABS
5.0000 mg | ORAL_TABLET | Freq: Every day | ORAL | Status: DC
  Filled 2014-02-27: qty 1

## 2014-02-27 MED ORDER — MORPHINE SULFATE (CONCENTRATE) 10 MG /0.5 ML PO SOLN: 5.0000 mg | ORAL | Status: DC | PRN

## 2014-02-27 MED ORDER — LEVOTHYROXINE SODIUM 25 MCG PO TABS
25.0000 ug | ORAL_TABLET | Freq: Every day | ORAL | Status: DC
  Filled 2014-02-27 (×2): qty 1

## 2014-02-27 NOTE — Progress Notes (Signed)
PATIENT DETAILS Name: [name] Age: 80 y.o. Sex: female Date of Birth: 01/01/1931 Admit Date: 02/22/2014 Admitting Physician Yevonne Pax, MD [id number], Chrissie Noa, MD  Subjective: Still very lethargic.  Assessment/Plan: Anemia  - Suspected subacute blood loss  - Likely secondary to a recent gastrointestinal bleeding-does have a history of diverticulosis. Was also on Eliquis prior to admission. Was admitted and given 2 units of PRBC, hemoglobin continues to be stable.  - Given her poor overall clinical condition, lethargy, GI has deferred any invasive endoscopic procedures for now.  - Patient has had numerous frequent falls in the past, given possible recent GI bleed-she's not a candidate for long-term anticoagulation. I have spoken at length with patient's son at bedside-[name], and he understands and accepts the risk of catastrophic from embolic phenomenon including stroke while off anticoagulation. For now would not even start antiplatelet agents.  -Continue to monitor CBC.   Hypotension  - Secondary to hypovolemic shock from dehydration, poor intake and recent also will GI bleed., resolved with IV fluids.  - Blood pressure continues to be stable off all antihypertensive medications at this time-however seems to be slowly creeping up-will restart lose dose Coreg  Acute renal failure on chronic kidney disease stage III  - Likely prerenal from dehydration, recent blood loss, diuretic and losartan use  - Renal function almost back to baseline with IV fluids.   Suspected recent gastrointestinal bleeding  - See above.  - No overt bleeding observed   Metabolic encephalopathy  - Likely secondary to electrolyte abnormalities, and renal failure.  - No indication of infection at this point, he remains afebrile, UA negative for UTI, chest x-ray negative for pneumonia. CT head on 3/22 was negative for acute intracranial abnormality.  - Unfortunately, continues to be very  lethargic, awakes briefly and then goes back to sleep. Hardly any oral intake over the past few days  Atrial fibrillation  - Significant bruising in her upper extremities, CT scan of the abdomen on 3/22 shows a small lateral left pectineus muscle hematoma, has had numerous recent falls, not a candidate for anticoagulation.  - Have spoken with patient's son-[name]-we have agreed to even discontinue IV heparin while she is inpatient. Palliative care evaluation done on 3/26-plans are for home with hospice -Rate controlled with atrial pacing 100%  - Sotalol, Coreg remain on hold. I do not plan on resuming Eliquis at this time.   Acute respiratory failure with hypoxia/recent outpatient treatment for bronchitis  -Chest x-ray at admission unremarkable the patient also profoundly dehydrated  -Hypoxemia could be primarily related to altered mentation and poor respiratory effort  -Stable on oxygen via nasal canula  -ABG 3/23 revealed PO2 57 with unknown baseline   Hypernatremia  - Secondary to dehydration, very poor oral intake. Resolved with D5W. Continue to monitor periodically.   Dehydration  - Now on IV fluids-will slowly decrease IVF to [facility]. This is secondary to poor oral intake.   Chronic diastolic heart failure  -Compensated at present   Thrombocytopenia  -Onset after admission, due to acute illness  -Follow closely   Hypokalemia  - Secondary to poor oral intake, stable now after repletion  Hypothyroidism  -Convert oral medication to IV until proves can swallow   Dysphagia  - Suspect secondary to acute illness and deconditioning.  - Seen by speech therapy on 3/24, current recommendations are for full liquids. Patient's son [name] agreeable with this plan, accepts all risks of aspiration. Does not  desire invasive investigations, does not desire artificial nutrition in the form of a PEG tube.   History of CVA (cerebrovascular accident)  - Continues to be at risk for future CVA  events-especially now since he is not a candidate for anticoagulation. Long discussion with son [name] at bedside-see above. Agreeable to continue to withhold all forms of anticoagulation. Given recurrent falls, suspected GI bleed-no longer a candidate for anticoagulation.   Failure to thrive  - Very weak, deconditioned, poor oral intake. Has significant medical comorbidities, and continues to be very encephalopathic. Very poor oral prognosis. Palliative care medicine consulted- current tentative plans are for home with hospice on 3/28. Per my discussion with the palliative care team, focus on comfort.  Pacemaker in place   DVT Prophylaxis:  Stop heparin-platelets are decreasing  Disposition: Remain inpatient-home with hospice in am  DVT Prophylaxis: scd  Code Status:  DNR  Family Communication Son at bedside  Procedures:  None  CONSULTS:  GI  MEDICATIONS: Scheduled Meds: . acetaminophen-codeine  1 tablet Oral QHS   And  . codeine  30 mg Oral QHS  . diazepam  2 mg Intravenous QHS  . fluticasone  1 spray Each Nare Daily  . ipratropium  1 spray Nasal q morning - 10a  . levothyroxine  12.5 mcg Intravenous Daily  . senna  2 tablet Oral QHS   Continuous Infusions: . dextrose 50 mL/hr at 02/26/14 2248   PRN Meds:.morphine CONCENTRATE, MUSCLE RUB, ondansetron (ZOFRAN) IV, ondansetron, sodium phosphate  Antibiotics: Anti-infectives   None       PHYSICAL EXAM: Vital signs in last 24 hours: Filed Vitals:   02/26/14 0423 02/26/14 1300 02/26/14 2203 02/27/14 0526  BP: 135/74 120/84 139/71 146/74  Pulse: 77 145 75 80  Temp: 98.5 F (36.9 C) 98.8 F (37.1 C) 97.8 F (36.6 C) 98 F (36.7 C)  TempSrc: Oral Axillary Oral Oral  Resp: 22 22 18 18   Height:      Weight: 78.7 kg (173 lb 8 oz)   77.4 kg (170 lb 10.2 oz)  SpO2: 94% 98% 99% 98%    Weight change: -1.3 kg (-2 lb 13.9 oz) Filed Weights   02/25/14 0455 02/26/14 0423 02/27/14 0526  Weight: 80.3 kg (177 lb  0.5 oz) 78.7 kg (173 lb 8 oz) 77.4 kg (170 lb 10.2 oz)   Body mass index is 28.4 kg/(m^2).   Gen Exam: Awake-continues to be lethargic  Neck: Supple, No JVD.   Chest: B/L Clear.   CVS: S1 S2 Regular, no murmurs.  Abdomen: soft, BS +, non tender, non distended.  Extremities: no edema, lower extremities warm to touch. Neurologic: Non Focal.   Skin: Skin see bruising in her upper thighs, extensive bruising in her bilateral upper extremities.  Wounds: N/A.   Intake/Output from previous day:  Intake/Output Summary (Last 24 hours) at 02/27/14 1055 Last data filed at 02/27/14 0602  Gross per 24 hour  Intake 1761.25 ml  Output    100 ml  Net 1661.25 ml     LAB RESULTS: CBC  Recent Labs Lab 02/22/14 1350  02/23/14 1848 02/24/14 0451 02/24/14 1910 02/25/14 0745 02/26/14 0800  WBC 15.9*  < > 11.6* 12.6* 12.6* 12.3* 11.2*  HGB 5.9*  < > 8.2* 8.1* 8.5* 8.2* 8.7*  HCT 17.7*  < > 24.5* 24.4* 25.8* 24.7* 26.7*  PLT 164  < > 120* 122* 122* 123* 116*  MCV 90.3  < > 86.9 86.5 87.2 87.3 87.3  MCH 30.1  < >  29.1 28.7 28.7 29.0 28.4  MCHC 33.3  < > 33.5 33.2 32.9 33.2 32.6  RDW 17.6*  < > 19.4* 19.2* 19.1* 19.0* 19.0*  LYMPHSABS 1.0  --   --   --   --   --   --   MONOABS 0.5  --   --   --   --   --   --   EOSABS 0.0  --   --   --   --   --   --   BASOSABS 0.0  --   --   --   --   --   --   < > = values in this interval not displayed.  Chemistries   Recent Labs Lab 02/23/14 1445 02/24/14 0451 02/25/14 0745 02/26/14 0800 02/27/14 0541  NA 151* 151* 149* 149* 144  K 3.1* 3.1* 2.5* 2.8* 3.5*  CL 115* 114* 113* 113* 108  CO2 21 20 23 25 26   GLUCOSE 90 92 174* 144* 118*  BUN 128* 113* 78* 49* 43*  CREATININE 3.38* 2.73* 1.71* 1.13* 1.15*  CALCIUM 8.5 8.3* 8.3* 8.4 8.5    CBG: No results found for this basename: GLUCAP,  in the last 168 hours  GFR Estimated Creatinine Clearance: 40.2 ml/min (by C-G formula based on Cr of 1.15).  Coagulation profile  Recent Labs Lab  02/22/14 1350  INR 1.64*    Cardiac Enzymes No results found for this basename: CK, CKMB, TROPONINI, MYOGLOBIN,  in the last 168 hours  No components found with this basename: POCBNP,  No results found for this basename: DDIMER,  in the last 72 hours No results found for this basename: HGBA1C,  in the last 72 hours No results found for this basename: CHOL, HDL, LDLCALC, TRIG, CHOLHDL, LDLDIRECT,  in the last 72 hours No results found for this basename: TSH, T4TOTAL, FREET3, T3FREE, THYROIDAB,  in the last 72 hours No results found for this basename: VITAMINB12, FOLATE, FERRITIN, TIBC, IRON, RETICCTPCT,  in the last 72 hours No results found for this basename: LIPASE, AMYLASE,  in the last 72 hours  Urine Studies No results found for this basename: UACOL, UAPR, USPG, UPH, UTP, UGL, UKET, UBIL, UHGB, UNIT, UROB, ULEU, UEPI, UWBC, URBC, UBAC, CAST, CRYS, UCOM, BILUA,  in the last 72 hours  MICROBIOLOGY: Recent Results (from the past 240 hour(s))  CULTURE, BLOOD (ROUTINE X 2)     Status: None   Collection Time    02/22/14  1:32 PM      Result Value Ref Range Status   Specimen Description BLOOD RIGHT ARM   Final   Special Requests BOTTLES DRAWN AEROBIC AND ANAEROBIC 8CC   Final   Culture  Setup Time     Final   Value: 02/22/2014 19:17     [facility]   Culture     Final   Value:        BLOOD CULTURE RECEIVED NO GROWTH TO DATE CULTURE WILL BE HELD FOR 5 DAYS BEFORE ISSUING A FINAL NEGATIVE REPORT     Performed at [facility]   Report Status PENDING   Incomplete  CULTURE, BLOOD (ROUTINE X 2)     Status: None   Collection Time    02/22/14  1:37 PM      Result Value Ref Range Status   Specimen Description BLOOD RIGHT HAND   Final   Special Requests BOTTLES DRAWN AEROBIC ONLY 7CC   Final   Culture  Setup Time     Final   Value: 02/22/2014 19:17     Performed at [facility]   Culture     Final   Value:        BLOOD CULTURE RECEIVED NO GROWTH TO  DATE CULTURE WILL BE HELD FOR 5 DAYS BEFORE ISSUING A FINAL NEGATIVE REPORT     Performed at [facility]   Report Status PENDING   Incomplete  URINE CULTURE     Status: None   Collection Time    02/22/14  2:56 PM      Result Value Ref Range Status   Specimen Description URINE, CLEAN CATCH   Final   Special Requests NONE   Final   Culture  Setup Time     Final   Value: 02/22/2014 20:34     Performed at [facility] Count     Final   Value: >=100,000 COLONIES/ML     Performed at [facility]   Culture     Final   Value: Multiple bacterial morphotypes present, none predominant. Suggest appropriate recollection if clinically indicated.     Performed at [facility]   Report Status 02/24/2014 FINAL   Final  MRSA PCR SCREENING     Status: None   Collection Time    02/22/14  9:08 PM      Result Value Ref Range Status   MRSA by PCR NEGATIVE  NEGATIVE Final   Comment:            The GeneXpert MRSA Assay (FDA     approved for NASAL specimens     only), is one component of a     comprehensive MRSA colonization     surveillance program. It is not     intended to diagnose MRSA     infection nor to guide or     monitor treatment for     MRSA infections.    RADIOLOGY STUDIES/RESULTS: Ct Abdomen Pelvis Wo Contrast  02/22/2014   CLINICAL DATA:  80 year old female with weakness, low hemoglobin and abdominal and pelvic pain.  EXAM: CT ABDOMEN AND PELVIS WITHOUT CONTRAST  TECHNIQUE: Multidetector CT imaging of the abdomen and pelvis was performed following the standard protocol without intravenous contrast.  COMPARISON:  06/12/2012 and prior CTs  FINDINGS: Coronary artery calcifications and mild bibasilar atelectasis/ scarring again noted.  Hepatic cyst is present.  The spleen, pancreas and adrenal glands are unremarkable.  Mild pericholecystic stranding is noted.  Bilateral renal atrophy is identified with right renal cysts. A 1.6 cm indeterminate  hyperdense lesion within left kidney is again noted.  Please note that parenchymal abnormalities may be missed without intravenous contrast.  Patient status post hysterectomy.  Colonic diverticulosis without evidence of diverticulitis.  There is no evidence of bowel obstruction, pneumoperitoneum or definite abscess.  The bladder is unremarkable.  A 1.3 x 1.6 cm hyperdense area along the lateral left pectineus muscle could represent a small hematoma.  No acute or suspicious bony abnormalities are identified. A severe lumbar scoliosis degenerative changes are noted.  IMPRESSION: Mild pericholecystic stranding of uncertain significance. Acute cholecystitis is not excluded and consider ultrasound evaluation as clinically indicated.  1.3 x 1.6 cm possible hematoma along the left pectineus muscle.  Bilateral renal atrophy and indeterminate 1.6 cm left renal lesion noted. Consider initial evaluation with ultrasound if patient has renal insufficiency.   Electronically Signed   By: Laveda Abbe M.D.   On: 02/22/2014  17:35   Ct Head Wo Contrast  02/22/2014   CLINICAL DATA:  80 year old female with weakness and lethargy  EXAM: CT HEAD WITHOUT CONTRAST  TECHNIQUE: Contiguous axial images were obtained from the base of the skull through the vertex without intravenous contrast.  COMPARISON:  10/08/2013 and prior CTs  FINDINGS: Atrophy and chronic small-vessel white matter ischemic changes are again identified.  No acute intracranial abnormalities are identified, including mass lesion or mass effect, hydrocephalus, extra-axial fluid collection, midline shift, hemorrhage, or acute infarction. The visualized bony calvarium is unremarkable  Surgical chronic changes of the right maxillary sinus are noted.  IMPRESSION: No evidence of acute intracranial abnormality.  Atrophy and chronic small-vessel white matter ischemic changes.   Electronically Signed   By: Laveda Abbe M.D.   On: 02/22/2014 16:34   Dg Esophagus  02/25/2014   CLINICAL  DATA:  Possible esophageal narrowing.  EXAM: ESOPHOGRAM/BARIUM SWALLOW  TECHNIQUE: Single contrast examination was performed using  thin barium.  FLUOROSCOPY TIME:  0 min 55 seconds.  COMPARISON:  CT ABD/PELV WO CM dated 02/22/2014  FINDINGS: Due to patient condition, examination was performed in the recumbent LPO position. Patient had difficulty drinking barium from a straw. Realtime and static evaluation of the esophagus shows no definite esophageal narrowing. Difficult to exclude narrowing at the gastroesophageal junction. A barium tablet was not administered due to patient condition. There are tertiary contractions. No definite fold thickening.  IMPRESSION: 1. Limited examination due to patient condition, as described above. 2. No definite esophageal narrowing. Difficult to exclude narrowing at the gastroesophageal junction. 3. Tertiary contractions are indicative of esophageal dysmotility.   Electronically Signed   By: Leanna Battles M.D.   On: 02/25/2014 11:21   Dg Chest Port 1 View  02/22/2014   CLINICAL DATA:  Weakness, hypertension, former smoker, history pacemaker, atrial fibrillation  EXAM: PORTABLE CHEST - 1 VIEW  COMPARISON:  Portable exam 1344 hr compared to 10/08/2013  FINDINGS: Right subclavian sequential transvenous pacemaker leads project over right atrium and right ventricle.  Enlargement of cardiac silhouette with pulmonary vascular congestion.  Stable mediastinal contours.  Chronic elevation of right diaphragm.  No gross infiltrate, pleural effusion or pneumothorax.  Bones demineralized.  : Enlargement of cardiac silhouette post pacemaker.  No definite acute abnormalities.   Electronically Signed   By: Ulyses Southward M.D.   On: 02/22/2014 14:14    Jeoffrey Massed, MD  [facility] Pager:[phone]  If 7PM-7AM, please contact night-coverage [email or web address] Password [facility] 02/27/2014, 10:55 AM   LOS: 5 days

## 2014-02-27 NOTE — Progress Notes (Signed)
Speech Language Pathology Treatment: Dysphagia  Patient Details Name: [name] MRN: [id number] DOB: 04/17/1931 Today's Date: 02/27/2014 Time: [id number] SLP Time Calculation (min): 20 min  Assessment / Plan / Recommendation Clinical Impression  Treatment focused on providing son with feeding strategies and oral care for pt after d/c. Pt is awake but still poorly participatory with POs, requiring max verbal and tactile cues to accept minimal trials, now with more consistent signs of aspiration. Pt did have recommended barium swallow, it was a limited study due to pts participation level, but did show dysmotility. Aspiration risk is high and ongoing. Son verbalized understanding of risk and of strategies.    HPI HPI: 80 year old female patient who lives with her son. Brought to the emergency department because of generalized weakness and altered mentation. Recent treatment by her PCP for bronchitis and was given antibiotics and prednisone taper. Patient has a history of falling that has worsened since December but over the past week she's had more progressive weakness. Per son, has a hisotry of GERD and has  upcoming appointment with Dr. Matthias HughsBuccini due to worsening globus with occassional regurgitation of bolus.    Pertinent Vitals NA  SLP Plan  Continue with current plan of care    Recommendations Diet recommendations: Thin liquid Liquids provided via: Straw Medication Administration: Crushed with puree Supervision: Staff to assist with self feeding Compensations: Slow rate;Small sips/bites Postural Changes and/or Swallow Maneuvers: Seated upright 90 degrees;Upright 30-60 min after meal              Oral Care Recommendations: Oral care BID Follow up Recommendations: 24 hour supervision/assistance Plan: Continue with current plan of care    GO    [facility] LPBonnie Keatyn Luck, MA CCC-SLP [phone]   [name], [name] 02/27/2014, 10:44 AM

## 2014-02-27 NOTE — Progress Notes (Signed)
Nutrition Brief Note  Chart reviewed. Pt now transitioning to comfort care. Per GOC note on 3/26, plan for comfort measures with no feeding tube. No further nutrition interventions warranted at this time. Current tentative plans to go home with hospice in 1-2 days. Please re-consult as needed.   Jarold MottoSamantha Aarav Burgett MS, RD, LDN Inpatient Registered Dietitian Pager: [phone] After-hours pager: [phone]

## 2014-02-28 DIAGNOSIS — Z950 Presence of cardiac pacemaker: Secondary | ICD-10-CM

## 2014-02-28 DIAGNOSIS — I4891 Unspecified atrial fibrillation: Secondary | ICD-10-CM

## 2014-02-28 LAB — CULTURE, BLOOD (ROUTINE X 2)
CULTURE: NO GROWTH
Culture: NO GROWTH

## 2014-02-28 MED ORDER — BIOTENE DRY MOUTH MT LIQD: 15.0000 mL | Freq: Two times a day (BID) | OROMUCOSAL | Status: DC

## 2014-02-28 MED ORDER — CHLORHEXIDINE GLUCONATE 0.12 % MT SOLN
15.0000 mL | Freq: Two times a day (BID) | OROMUCOSAL | Status: DC
  Administered 2014-02-28: 15 mL via OROMUCOSAL
  Filled 2014-02-28 (×3): qty 15

## 2014-02-28 MED ORDER — MORPHINE SULFATE (CONCENTRATE) 10 MG /0.5 ML PO SOLN: 5.0000 mg | Freq: Three times a day (TID) | ORAL | Status: AC

## 2014-02-28 MED ORDER — MORPHINE SULFATE (CONCENTRATE) 10 MG /0.5 ML PO SOLN: 5.0000 mg | ORAL | Status: AC | PRN

## 2014-02-28 MED ORDER — BISACODYL 10 MG RE SUPP: 10.0000 mg | Freq: Every day | RECTAL | Status: AC | PRN

## 2014-02-28 MED ORDER — DIAZEPAM 1 MG/ML PO SOLN: 5.0000 mg | Freq: Every day | ORAL | Status: AC

## 2014-02-28 MED ORDER — ADENOSINE 6 MG/2ML IV SOLN
6.0000 mg | Freq: Once | INTRAVENOUS | Status: AC
  Administered 2014-02-28: 6 mg via INTRAVENOUS
  Filled 2014-02-28: qty 2

## 2014-02-28 MED ORDER — DIAZEPAM 1 MG/ML PO SOLN: 2.0000 mg | Freq: Three times a day (TID) | ORAL | Status: AC | PRN

## 2014-02-28 NOTE — Progress Notes (Signed)
MD notified about concern for patient to safely swallow PO pills. MD aware and verbal order to hold.

## 2014-02-28 NOTE — Significant Event (Signed)
Rapid Response Event Note Called to see pt for SVT Overview: Time Called: 0538 Arrival Time: 0540 Event Type: Cardiac  Initial Focused Assessment: On assessment pt is only responsive to painful stimulation by moaning.  Pt's HR 160s SVT, confirmed by EKG.  Adenosine 6mg  IV given without effect.  Benedetto Coons. Callahan, NP called son, Casimiro NeedleMichael, to discuss options for treatment given her DNR status & desire for comfort care. SBP 88-94, HR 160s, sats 100% 2L.  After discussion with son the decision is made to hold off doing anything else to treat the pt's HR at this time and let him discuss the situation with the rounding MD.  Interventions: Adenosine 6mg  IV x1  Event Summary: Lenny Pastelom Callahan, NP at bedside  Taylor Levick Hedgecock

## 2014-02-28 NOTE — Progress Notes (Signed)
Pt unable to swallow PO meds. RN paged MD. MD made aware of patient's condition.

## 2014-02-28 NOTE — Progress Notes (Signed)
Nursing assistant was checking morning vital signs @ 0500 when it showed pt's HR as 165, BP 118/78, 02 sats 98%, Resp 18, temp 98.0. RN paged NP and obtained a 12 lead EKG. The 12 lead suggested SVT. Paged NP the results and NP stated to call rapid response. Rapid response RN and NP at bedside. Decided to give 6 mg IV of adenosine. Pt still in SVT after adminitration. NP called son and discussed other options for patient. NP instructed to DC tele and to frequently monitor BP. RN rechecked BP in 30 min 93/67. Will continue to monitor.

## 2014-02-28 NOTE — Progress Notes (Signed)
CSW set up ambulance transport home and confirmed address with family.  CSW requested "stair chair" from EMS to help pt get up the steps when they arrive.  CSW signing off, please reconsult if further needs arise.

## 2014-02-28 NOTE — Progress Notes (Signed)
[facility] progress note. Chief complaint. Tachycardia. History of present illness. This 80 year old female in hospital with anemia while on anticoagulant Eliquis. Blood loss was thought to be secondary to GI. During hospital discussions the patient was made DO NOT RESUSCITATE and plans are for discharge with hospice services. Staff , while taking morning vital signs noted patient tachycardic in the 160 range. 12-lead EKG was obtained and this suggested SVT. I did request the staff contact rapid response I came to the bedside as well. After discussing the case we decided to proceed with a 6 mg dose of adenosine. The patient tolerated the adenosine dose to unfortunately this did not break the SVT rhythm. There is no indication of chest pain and patient's systolic blood pressures relatively stable and he 90-100 range. Vital signs. Temperature 90.8, pulse 165, respiration 18, blood pressure 118/78. O2 sats 99%. General appearance. Frail elderly female who is alert with stimulation but minimally verbal. Cardiac. Regular but tachycardic. Lungs. Reduced but clear with poor effort. Abdomen. Soft with positive bowel sounds. Impression/plan. Problem #1. SVT. This patient with prior history atrial fib and pacemaker implant. Current SVT. Nonresponsive to adenosine. I discussed the situation with the patient's son prior to any further interventions given her hospice status. Patient signed did not wish any further aggressive interventions taken at this time and wishes the rounding physician to review the patient early this a.m. and contact him back. Because of the son's wishes I will defer any transfers or cardiology consult at this time. We'll also defer beta blocker or calcium channel blocker given her marginal blood pressure. Patient is clinically stable at this time and this her tachycardia.

## 2014-02-28 NOTE — Progress Notes (Signed)
Night Shift RN gave in report that foley catheter was leaking. RN assessed with Charge RN and pulled back 1cc from catheter bulb. Catheter still putting out urine. Removed 1cc and placed 10cc back in bulb. Will continue to assess.

## 2014-02-28 NOTE — Discharge Summary (Signed)
PATIENT DETAILS Name: [name] Age: 80 y.o. Sex: female Date of Birth: 06-07-1931 MRN: [id number]. Admit Date: 02/22/2014 Admitting Physician: Yevonne Pax, MD [id number], Chrissie Noa, MD  Recommendations for Outpatient Follow-up:  1. Optimize comfort medications  PRIMARY DISCHARGE DIAGNOSIS:  Active Problems:   Hypertension   Anemia   Metabolic encephalopathy   Atrial fibrillation   Hypokalemia   History of CVA (cerebrovascular accident)   Pacemaker   Hypotension   AKI (acute kidney injury)   Chronic diastolic heart failure   Hematest positive stools   CKD (chronic kidney disease), stage III   Dehydration with hypernatremia   Thrombocytopenia   Acute respiratory failure with hypoxia   Hypernatremia   Palliative care encounter      PAST MEDICAL HISTORY: Past Medical History  Diagnosis Date  . Hypertension   . Hyperlipidemia   . GERD (gastroesophageal reflux disease)   . Dysphagia     appt with Molly Maduro Buccini upcoming  . Chronic recurrent sinusitis   . Recurrent UTI   . Atrial fibrillation     ???  . Arthritis   . TIA (transient ischemic attack)   . Anemia   . Hypothyroidism     DISCHARGE MEDICATIONS:   Medication List    STOP taking these medications       acetaminophen-codeine 300-60 MG per tablet  Commonly known as:  TYLENOL #4     allopurinol 100 MG tablet  Commonly known as:  ZYLOPRIM     amLODipine 10 MG tablet  Commonly known as:  NORVASC     apixaban 2.5 MG Tabs tablet  Commonly known as:  ELIQUIS     atorvastatin 20 MG tablet  Commonly known as:  LIPITOR     buPROPion 150 MG 24 hr tablet  Commonly known as:  WELLBUTRIN XL     calcitRIOL 0.25 MCG capsule  Commonly known as:  ROCALTROL     dextromethorphan-guaiFENesin 30-600 MG per 12 hr tablet  Commonly known as:  MUCINEX DM     diazepam 10 MG tablet  Commonly known as:  VALIUM  Replaced by:  diazepam 1 MG/ML solution     DULoxetine 60 MG capsule  Commonly known as:   CYMBALTA     furosemide 40 MG tablet  Commonly known as:  LASIX     hydrALAZINE 25 MG tablet  Commonly known as:  APRESOLINE     ipratropium 0.06 % nasal spray  Commonly known as:  ATROVENT     losartan 50 MG tablet  Commonly known as:  COZAAR     multivitamin with minerals Tabs tablet     pantoprazole 40 MG tablet  Commonly known as:  PROTONIX     predniSONE 10 MG tablet  Commonly known as:  DELTASONE     sotalol 120 MG tablet  Commonly known as:  BETAPACE     tiZANidine 2 MG tablet  Commonly known as:  ZANAFLEX     tolterodine 4 MG 24 hr capsule  Commonly known as:  DETROL LA     triamcinolone 55 MCG/ACT nasal inhaler  Commonly known as:  NASACORT      TAKE these medications       bisacodyl 10 MG suppository  Commonly known as:  DULCOLAX  Place 1 suppository (10 mg total) rectally daily as needed for mild constipation.     carvedilol 3.125 MG tablet  Commonly known as:  COREG  Take 3.125 mg by mouth 2 (two) times daily with a  meal.     diazepam 1 MG/ML solution  Commonly known as:  VALIUM  Take 2 mLs (2 mg total) by mouth every 8 (eight) hours as needed for anxiety or sedation.     diazepam 1 MG/ML solution  Commonly known as:  VALIUM  Take 5 mLs (5 mg total) by mouth at bedtime.     levothyroxine 25 MCG tablet  Commonly known as:  SYNTHROID, LEVOTHROID  Take 1 tablet (25 mcg total) by mouth every morning.     morphine CONCENTRATE 10 mg / 0.5 ml concentrated solution  Take 0.25 mLs (5 mg total) by mouth every 2 (two) hours as needed for moderate pain, anxiety or shortness of breath.     morphine CONCENTRATE 10 mg / 0.5 ml concentrated solution  Place 0.25 mLs (5 mg total) under the tongue 3 (three) times daily.        ALLERGIES:  No Known Allergies  BRIEF HPI:  See H&P, Labs, Consult and Test reports for all details in brief, is a 80 y.o. female with multiple medical problems brought to the ER by her son for evaluation. Patient was getting much  more weak than usual, poor appetite. She also has had multiple recent falls as well.When she came in to the ER she was noted to be HYPOtensive and was given fluids which she responded with some improvement in her blood pressure. Her labs revealed that she had a hemoglobin of 5.9 (last hemoglobin on file was 10 in September 2014) and her Creatinine has also gone up to 4.7 from a baseline of around 1.1  CONSULTATIONS:   None  PERTINENT RADIOLOGIC STUDIES: Ct Abdomen Pelvis Wo Contrast  02/22/2014   CLINICAL DATA:  80 year old female with weakness, low hemoglobin and abdominal and pelvic pain.  EXAM: CT ABDOMEN AND PELVIS WITHOUT CONTRAST  TECHNIQUE: Multidetector CT imaging of the abdomen and pelvis was performed following the standard protocol without intravenous contrast.  COMPARISON:  06/12/2012 and prior CTs  FINDINGS: Coronary artery calcifications and mild bibasilar atelectasis/ scarring again noted.  Hepatic cyst is present.  The spleen, pancreas and adrenal glands are unremarkable.  Mild pericholecystic stranding is noted.  Bilateral renal atrophy is identified with right renal cysts. A 1.6 cm indeterminate hyperdense lesion within left kidney is again noted.  Please note that parenchymal abnormalities may be missed without intravenous contrast.  Patient status post hysterectomy.  Colonic diverticulosis without evidence of diverticulitis.  There is no evidence of bowel obstruction, pneumoperitoneum or definite abscess.  The bladder is unremarkable.  A 1.3 x 1.6 cm hyperdense area along the lateral left pectineus muscle could represent a small hematoma.  No acute or suspicious bony abnormalities are identified. A severe lumbar scoliosis degenerative changes are noted.  IMPRESSION: Mild pericholecystic stranding of uncertain significance. Acute cholecystitis is not excluded and consider ultrasound evaluation as clinically indicated.  1.3 x 1.6 cm possible hematoma along the left pectineus muscle.   Bilateral renal atrophy and indeterminate 1.6 cm left renal lesion noted. Consider initial evaluation with ultrasound if patient has renal insufficiency.   Electronically Signed   By: Laveda Abbe M.D.   On: 02/22/2014 17:35   Ct Head Wo Contrast  02/22/2014   CLINICAL DATA:  80 year old female with weakness and lethargy  EXAM: CT HEAD WITHOUT CONTRAST  TECHNIQUE: Contiguous axial images were obtained from the base of the skull through the vertex without intravenous contrast.  COMPARISON:  10/08/2013 and prior CTs  FINDINGS: Atrophy and chronic small-vessel white  matter ischemic changes are again identified.  No acute intracranial abnormalities are identified, including mass lesion or mass effect, hydrocephalus, extra-axial fluid collection, midline shift, hemorrhage, or acute infarction. The visualized bony calvarium is unremarkable  Surgical chronic changes of the right maxillary sinus are noted.  IMPRESSION: No evidence of acute intracranial abnormality.  Atrophy and chronic small-vessel white matter ischemic changes.   Electronically Signed   By: Laveda Abbe M.D.   On: 02/22/2014 16:34   Dg Esophagus  02/25/2014   CLINICAL DATA:  Possible esophageal narrowing.  EXAM: ESOPHOGRAM/BARIUM SWALLOW  TECHNIQUE: Single contrast examination was performed using  thin barium.  FLUOROSCOPY TIME:  0 min 55 seconds.  COMPARISON:  CT ABD/PELV WO CM dated 02/22/2014  FINDINGS: Due to patient condition, examination was performed in the recumbent LPO position. Patient had difficulty drinking barium from a straw. Realtime and static evaluation of the esophagus shows no definite esophageal narrowing. Difficult to exclude narrowing at the gastroesophageal junction. A barium tablet was not administered due to patient condition. There are tertiary contractions. No definite fold thickening.  IMPRESSION: 1. Limited examination due to patient condition, as described above. 2. No definite esophageal narrowing. Difficult to exclude narrowing  at the gastroesophageal junction. 3. Tertiary contractions are indicative of esophageal dysmotility.   Electronically Signed   By: Leanna Battles M.D.   On: 02/25/2014 11:21   Dg Chest Port 1 View  02/22/2014   CLINICAL DATA:  Weakness, hypertension, former smoker, history pacemaker, atrial fibrillation  EXAM: PORTABLE CHEST - 1 VIEW  COMPARISON:  Portable exam 1344 hr compared to 10/08/2013  FINDINGS: Right subclavian sequential transvenous pacemaker leads project over right atrium and right ventricle.  Enlargement of cardiac silhouette with pulmonary vascular congestion.  Stable mediastinal contours.  Chronic elevation of right diaphragm.  No gross infiltrate, pleural effusion or pneumothorax.  Bones demineralized.  : Enlargement of cardiac silhouette post pacemaker.  No definite acute abnormalities.   Electronically Signed   By: Ulyses Southward M.D.   On: 02/22/2014 14:14     PERTINENT LAB RESULTS: CBC:  Recent Labs  02/26/14 0800  WBC 11.2*  HGB 8.7*  HCT 26.7*  PLT 116*   CMET CMP     Component Value Date/Time   NA 144 02/27/2014 0541   K 3.5* 02/27/2014 0541   CL 108 02/27/2014 0541   CO2 26 02/27/2014 0541   GLUCOSE 118* 02/27/2014 0541   BUN 43* 02/27/2014 0541   CREATININE 1.15* 02/27/2014 0541   CALCIUM 8.5 02/27/2014 0541   PROT 4.9* 02/24/2014 0451   ALBUMIN 2.3* 02/24/2014 0451   AST 31 02/24/2014 0451   ALT 15 02/24/2014 0451   ALKPHOS 44 02/24/2014 0451   BILITOT 0.5 02/24/2014 0451   GFRNONAA 44* 02/27/2014 0541   GFRAA 51* 02/27/2014 0541    GFR Estimated Creatinine Clearance: 40.5 ml/min (by C-G formula based on Cr of 1.15). No results found for this basename: LIPASE, AMYLASE,  in the last 72 hours No results found for this basename: CKTOTAL, CKMB, CKMBINDEX, TROPONINI,  in the last 72 hours No components found with this basename: POCBNP,  No results found for this basename: DDIMER,  in the last 72 hours No results found for this basename: HGBA1C,  in the last 72  hours No results found for this basename: CHOL, HDL, LDLCALC, TRIG, CHOLHDL, LDLDIRECT,  in the last 72 hours No results found for this basename: TSH, T4TOTAL, FREET3, T3FREE, THYROIDAB,  in the last 72 hours No results  found for this basename: VITAMINB12, FOLATE, FERRITIN, TIBC, IRON, RETICCTPCT,  in the last 72 hours Coags: No results found for this basename: PT, INR,  in the last 72 hours Microbiology: Recent Results (from the past 240 hour(s))  CULTURE, BLOOD (ROUTINE X 2)     Status: None   Collection Time    02/22/14  1:32 PM      Result Value Ref Range Status   Specimen Description BLOOD RIGHT ARM   Final   Special Requests BOTTLES DRAWN AEROBIC AND ANAEROBIC 8CC   Final   Culture  Setup Time     Final   Value: 02/22/2014 19:17     Performed at [facility]   Culture     Final   Value:        BLOOD CULTURE RECEIVED NO GROWTH TO DATE CULTURE WILL BE HELD FOR 5 DAYS BEFORE ISSUING A FINAL NEGATIVE REPORT     Performed at [facility]   Report Status PENDING   Incomplete  CULTURE, BLOOD (ROUTINE X 2)     Status: None   Collection Time    02/22/14  1:37 PM      Result Value Ref Range Status   Specimen Description BLOOD RIGHT HAND   Final   Special Requests BOTTLES DRAWN AEROBIC ONLY [facility]7CC   Final   Culture  Setup Time     Final   Value: 02/22/2014 19:17     Performed at [facility]   Culture     Final   Value:        BLOOD CULTURE RECEIVED NO GROWTH TO DATE CULTURE WILL BE HELD FOR 5 DAYS BEFORE ISSUING A FINAL NEGATIVE REPORT     Performed at [facility]   Report Status PENDING   Incomplete  URINE CULTURE     Status: None   Collection Time    02/22/14  2:56 PM      Result Value Ref Range Status   Specimen Description URINE, CLEAN CATCH   Final   Special Requests NONE   Final   Culture  Setup Time     Final   Value: 02/22/2014 20:34     Performed at [facility]   Colony Count     Final   Value: >=100,000 COLONIES/ML      Performed at [facility]   Culture     Final   Value: Multiple bacterial morphotypes present, none predominant. Suggest appropriate recollection if clinically indicated.     Performed at [facility]   Report Status 02/24/2014 FINAL   Final  MRSA PCR SCREENING     Status: None   Collection Time    02/22/14  9:08 PM      Result Value Ref Range Status   MRSA by PCR NEGATIVE  NEGATIVE Final   Comment:            The GeneXpert MRSA Assay (FDA     approved for NASAL specimens     only), is one component of a     comprehensive MRSA colonization     surveillance program. It is not     intended to diagnose MRSA     infection nor to guide or     monitor treatment for     MRSA infections.     BRIEF HOSPITAL COURSE:  Anemia  - Suspected subacute blood loss  - Likely secondary to a recent gastrointestinal bleeding-does have a history of diverticulosis.  Was also on Eliquis prior to admission. Was admitted and given 2 units of PRBC, hemoglobin continues to be stable.  - Given her poor overall clinical condition, lethargy, GI has deferred any invasive endoscopic procedures for now.  - Patient has had numerous frequent falls in the past, given possible recent GI bleed-she's not a candidate for long-term anticoagulation. I have spoken at length with patient's son at bedside-[name], and he understands and accepts the risk of catastrophic from embolic phenomenon including stroke while off anticoagulation. For now would not even start antiplatelet agents.  -Unfortunately, although Hb continues to be stable post transfusion, patient continues to be very lethargic, after a prolonged discussion with family, we have now decided to transition to comfort measures, family wanting to take patient home with hospice.  Hypotension  - Secondary to hypovolemic shock from dehydration, poor intake and recent also will GI bleed., resolved with IV fluids.  - Blood pressure continues to be stable off  all antihypertensive medications at this time-however seems to be slowly creeping up-will restart lose dose Coreg if patient able to swallow-otherwise goal at this time is just for comfort.  Acute renal failure on chronic kidney disease stage III  - Likely prerenal from dehydration, recent blood loss, diuretic and losartan use  - Renal function almost back to baseline with IV fluids.  -patient continues to be very lethargic, and with hardly any oral intake as well, suspect once she comes off IVF she will have worsening dehydration and renal failure. Family aware of this, and as noted above, we are transitioning to comfort measures and family wanting to take her home with home hospice services  Suspected recent gastrointestinal bleeding  - See above.  - No overt bleeding observed this hospitalization  Metabolic encephalopathy  - Likely secondary to electrolyte abnormalities, and renal failure.  - No indication of infection at this point, he remains afebrile, UA negative for UTI, chest x-ray negative for pneumonia. CT head on 3/22 was negative for acute intracranial abnormality.  - Unfortunately, continues to be very lethargic, awakes briefly and then goes back to sleep. Hardly any oral intake over the past 1 week or so.  Atrial fibrillation  - Significant bruising in her upper extremities, CT scan of the abdomen on 3/22 shows a small lateral left pectineus muscle hematoma, has had numerous recent falls, not a candidate for anticoagulation.  - Have spoken with patient's son-[name]-we have agreed to even discontinue IV heparin while she is inpatient. Palliative care evaluation done on 3/26-plans are for home with hospice  -Rate controlled with atrial pacing 100%  - Sotalol, Coreg remain on hold. I do not plan on resuming Eliquis at this time.   SVT -on 3/28-noted to be in SVT-no response to adenosine, will not escalate care at this time, patient is deteriorating slowly and is dying. Coreg if  able to swallow, otherwise will focus on comfort care.  Acute respiratory failure with hypoxia/recent outpatient treatment for bronchitis  -Chest x-ray at admission unremarkable the patient also profoundly dehydrated  -Hypoxemia could be primarily related to altered mentation and poor respiratory effort  -Stable on oxygen via nasal canula  -ABG 3/23 revealed PO2 57 with unknown baseline   Hypernatremia  - Secondary to dehydration, very poor oral intake. Resolved with D5W.   Dehydration  - Now on IV fluids-will slowly decrease IVF to [facility]. This is secondary to poor oral intake.  -Unfortunately, continues to be very lethargic, awakes briefly and then goes back to sleep. Hardly  any oral intake over the past 1 week or so.  Chronic diastolic heart failure  -Compensated at present   Thrombocytopenia  -Onset after admission, due to acute illness   Hypokalemia  - Secondary to poor oral intake, stable now after repletion   Hypothyroidism  -Convert oral medication to IV until proves can swallow   Dysphagia  - Suspect secondary to acute illness and deconditioning.  - Seen by speech therapy on 3/24, current recommendations are for full liquids. Patient's son [name] agreeable with this plan, accepts all risks of aspiration. Does not desire invasive investigations, does not desire artificial nutrition in the form of a PEG tube.  -On discharge have asked family to initiate comfort feeds on discharge  History of CVA (cerebrovascular accident)  - Continues to be at risk for future CVA events-especially now since he is not a candidate for anticoagulation. Long discussion with son [name] at bedside-see above. Agreeable to continue to withhold all forms of anticoagulation. Given recurrent falls, suspected GI bleed-no longer a candidate for anticoagulation.   Failure to thrive  - Very weak, deconditioned, poor oral intake. Has significant medical comorbidities, and continues to be very  encephalopathic. Very poor oral prognosis. Palliative care medicine consulted- currentplans are for home with hospice on 3/28. Per my discussion with the palliative care team, focus on comfort. I have also d/w family, they have now elected to focus on comfort  Pacemaker in place   TODAY-DAY OF DISCHARGE:  Subjective:   Ileene Blakesley today continues to weak and lethargic.  Objective:   Blood pressure 93/67, pulse 165, temperature 98 F (36.7 C), temperature source Oral, resp. rate 18, height 5\' 5"  (1.651 m), weight 78.8 kg (173 lb 11.6 oz), SpO2 99.00%. No intake or output data in the 24 hours ending 02/28/14 1000 Filed Weights   02/26/14 0423 02/27/14 0526 02/28/14 0500  Weight: 78.7 kg (173 lb 8 oz) 77.4 kg (170 lb 10.2 oz) 78.8 kg (173 lb 11.6 oz)    Exam Awake Alert, but very weak and lethargic [place].AT,PERRAL Supple Neck,No JVD, No cervical lymphadenopathy appriciated.  Symmetrical Chest wall movement, Good air movement bilaterally, CTAB RRR,No Gallops,Rubs or new Murmurs, No Parasternal Heave +ve B.Sounds, Abd Soft, Non tender, No organomegaly appriciated, No rebound -guarding or rigidity. No Cyanosis, Clubbing or edema, No new Rash or bruise  DISCHARGE CONDITION: Stable  DISPOSITION: Home with home Hospice  DISCHARGE INSTRUCTIONS:    Activity:  As tolerated with Full fall precautions use walker/cane & assistance as needed  Diet recommendation: Comfort feeds      Discharge Orders   Future Appointments Provider Department Dept Phone   05/19/2014 3:45 PM Everette Rank, MD [facility] [address] [phone]   Future Orders Complete By Expires   Diet Carb Modified  As directed    Scheduling Instructions:     Comfort feeds   Increase activity slowly  As directed       Follow-up Information   Schedule an appointment as soon as possible for a visit with Johny Blamer, MD. (As needed)    Specialty:  Family Medicine   Contact information:   [address] [place] [address] [phone]      Total Time spent on discharge equals 45 minutes.  SignedJeoffrey Massed 02/28/2014 10:00 AM

## 2014-02-28 NOTE — Progress Notes (Signed)
[name] to be D/C'd home with hospice per MD order.  Discussed with the patient and all questions fully answered.    Medication List    STOP taking these medications       acetaminophen-codeine 300-60 MG per tablet  Commonly known as:  TYLENOL #4     allopurinol 100 MG tablet  Commonly known as:  ZYLOPRIM     amLODipine 10 MG tablet  Commonly known as:  NORVASC     apixaban 2.5 MG Tabs tablet  Commonly known as:  ELIQUIS     atorvastatin 20 MG tablet  Commonly known as:  LIPITOR     buPROPion 150 MG 24 hr tablet  Commonly known as:  WELLBUTRIN XL     calcitRIOL 0.25 MCG capsule  Commonly known as:  ROCALTROL     dextromethorphan-guaiFENesin 30-600 MG per 12 hr tablet  Commonly known as:  MUCINEX DM     diazepam 10 MG tablet  Commonly known as:  VALIUM  Replaced by:  diazepam 1 MG/ML solution     DULoxetine 60 MG capsule  Commonly known as:  CYMBALTA     furosemide 40 MG tablet  Commonly known as:  LASIX     hydrALAZINE 25 MG tablet  Commonly known as:  APRESOLINE     ipratropium 0.06 % nasal spray  Commonly known as:  ATROVENT     losartan 50 MG tablet  Commonly known as:  COZAAR     multivitamin with minerals Tabs tablet     pantoprazole 40 MG tablet  Commonly known as:  PROTONIX     predniSONE 10 MG tablet  Commonly known as:  DELTASONE     sotalol 120 MG tablet  Commonly known as:  BETAPACE     tiZANidine 2 MG tablet  Commonly known as:  ZANAFLEX     tolterodine 4 MG 24 hr capsule  Commonly known as:  DETROL LA     triamcinolone 55 MCG/ACT nasal inhaler  Commonly known as:  NASACORT      TAKE these medications       bisacodyl 10 MG suppository  Commonly known as:  DULCOLAX  Place 1 suppository (10 mg total) rectally daily as needed for mild constipation.     carvedilol 3.125 MG tablet  Commonly known as:  COREG  Take 3.125 mg by mouth 2 (two) times daily with a meal.     diazepam 1 MG/ML solution  Commonly known as:   VALIUM  Take 2 mLs (2 mg total) by mouth every 8 (eight) hours as needed for anxiety or sedation.     diazepam 1 MG/ML solution  Commonly known as:  VALIUM  Take 5 mLs (5 mg total) by mouth at bedtime.     levothyroxine 25 MCG tablet  Commonly known as:  SYNTHROID, LEVOTHROID  Take 1 tablet (25 mcg total) by mouth every morning.     morphine CONCENTRATE 10 mg / 0.5 ml concentrated solution  Take 0.25 mLs (5 mg total) by mouth every 2 (two) hours as needed for moderate pain, anxiety or shortness of breath.     morphine CONCENTRATE 10 mg / 0.5 ml concentrated solution  Place 0.25 mLs (5 mg total) under the tongue 3 (three) times daily.       IVs taken out. Foley Catheter left in per MD verbal order to go home with for hospice care.  Escorted via EMS to home with hospice.     [name], [name]  02/28/2014 11:07 AM

## 2014-03-01 ENCOUNTER — Encounter: Payer: Self-pay | Admitting: Internal Medicine

## 2014-03-01 LAB — MDC_IDC_ENUM_SESS_TYPE_REMOTE
Battery Remaining Longevity: 114 mo
Brady Statistic AP VP Percent: 1 %
Brady Statistic AP VS Percent: 93 %
Brady Statistic AS VP Percent: 1 %
Brady Statistic AS VS Percent: 7.1 %
Brady Statistic RV Percent Paced: 1 %
Date Time Interrogation Session: 20150329073144
Implantable Pulse Generator Model: 2240
Implantable Pulse Generator Serial Number: 7542144
Lead Channel Impedance Value: 300 Ohm
Lead Channel Impedance Value: 580 Ohm
Lead Channel Sensing Intrinsic Amplitude: 1.1 mV
Lead Channel Setting Pacing Amplitude: 2 V
Lead Channel Setting Pacing Amplitude: 2 V
Lead Channel Setting Sensing Sensitivity: 2 mV
MDC IDC MSMT BATTERY VOLTAGE: 3.02 V
MDC IDC MSMT LEADCHNL RV SENSING INTR AMPL: 12 mV
MDC IDC SET LEADCHNL RV PACING PULSEWIDTH: 0.4 ms
MDC IDC STAT BRADY RA PERCENT PACED: 92 %

## 2014-03-02 ENCOUNTER — Ambulatory Visit (INDEPENDENT_AMBULATORY_CARE_PROVIDER_SITE_OTHER): Admitting: *Deleted

## 2014-03-02 DIAGNOSIS — I4891 Unspecified atrial fibrillation: Secondary | ICD-10-CM

## 2014-03-02 DIAGNOSIS — Z950 Presence of cardiac pacemaker: Secondary | ICD-10-CM

## 2014-03-04 DEATH — deceased

## 2014-03-23 NOTE — Consult Note (Signed)
I have reviewed and discussed the care of this patient in detail with the nurse practitioner including pertinent patient records, physical exam findings and data. I agree with details of this encounter.  

## 2014-05-19 ENCOUNTER — Ambulatory Visit: Payer: Medicare Other | Admitting: Interventional Cardiology

## 2014-11-12 ENCOUNTER — Encounter (HOSPITAL_COMMUNITY): Payer: Self-pay | Admitting: Internal Medicine

## 2015-03-27 IMAGING — RF DG ESOPHAGUS
14 series · 14 of 14 positions shown · non-contrast
Comparison: CT ABD/PELV WO CM dated 02/22/2014

CLINICAL DATA: Possible esophageal narrowing.

EXAM:
ESOPHOGRAM/BARIUM SWALLOW
TECHNIQUE: Single contrast examination was performed using  thin barium.
FLUOROSCOPY TIME:  0 min 55 seconds.

[Series 1: run · 1 of 1 slices shown (1 of 14)]
[im 1/1]
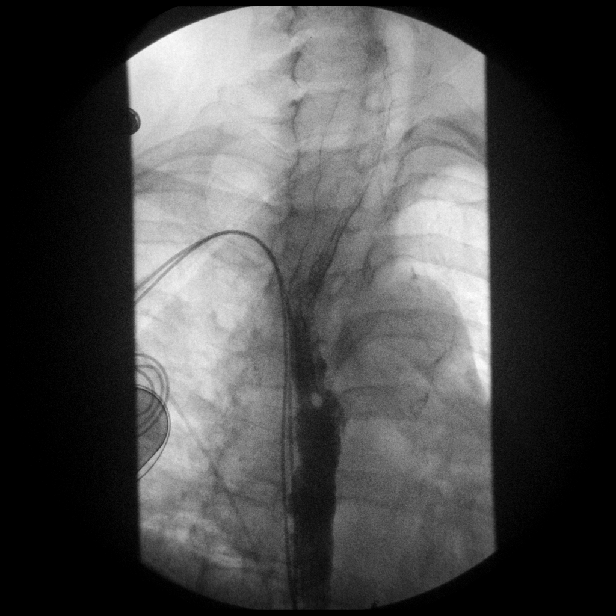

[Series 2: run · 1 of 1 slices shown (2 of 14)]
[im 1/1]
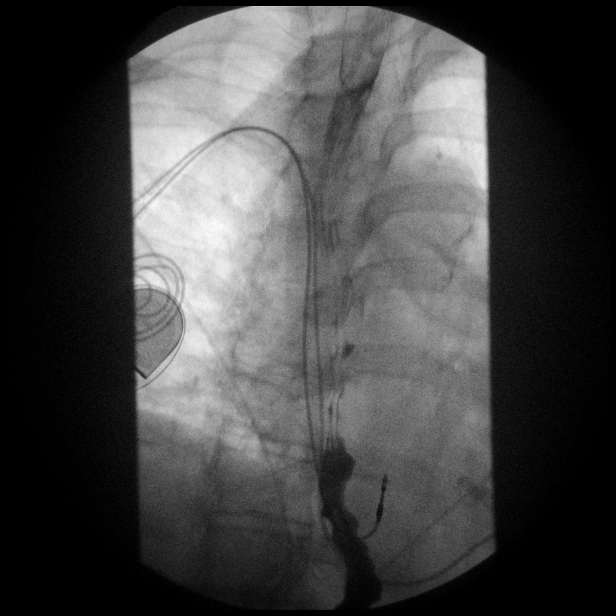

[Series 3: run · 1 of 1 slices shown (3 of 14)]
[im 1/1]
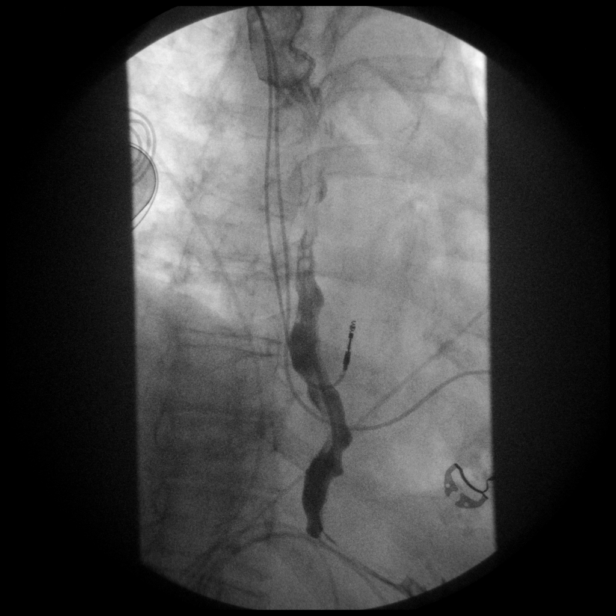

[Series 4: run · 1 of 1 slices shown (4 of 14)]
[im 1/1]
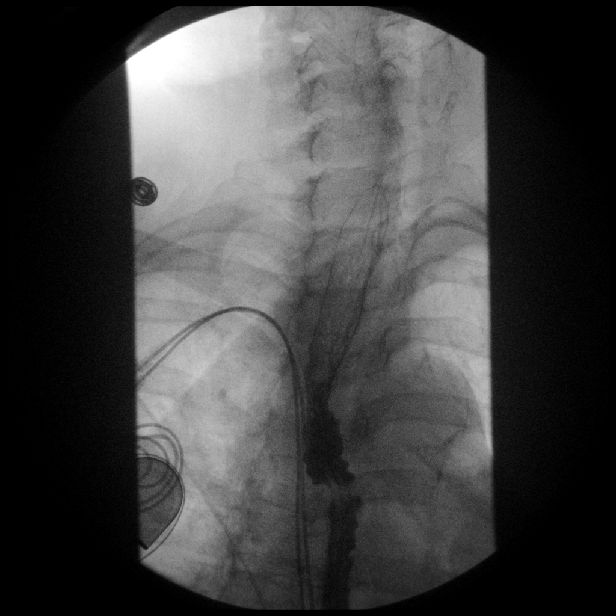

[Series 5: run · 1 of 1 slices shown (5 of 14)]
[im 1/1]
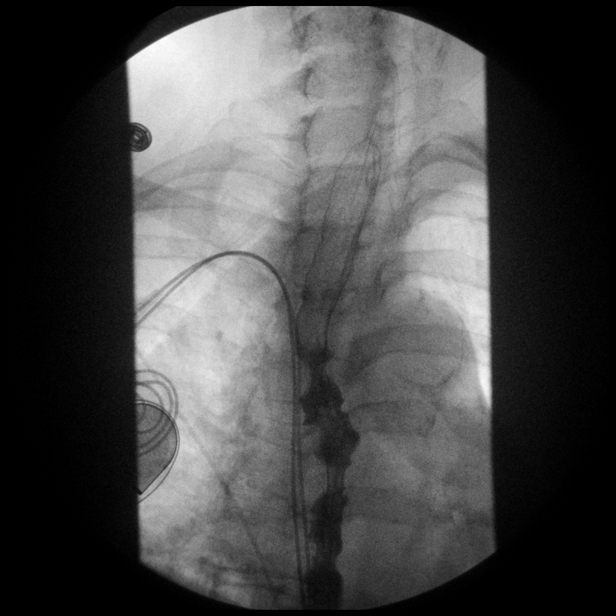

[Series 6: run · 1 of 1 slices shown (6 of 14)]
[im 1/1]
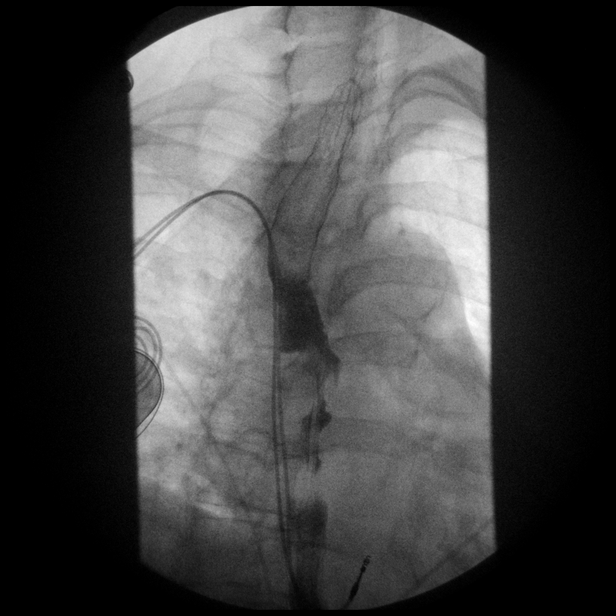

[Series 7: run · 1 of 1 slices shown (7 of 14)]
[im 1/1]
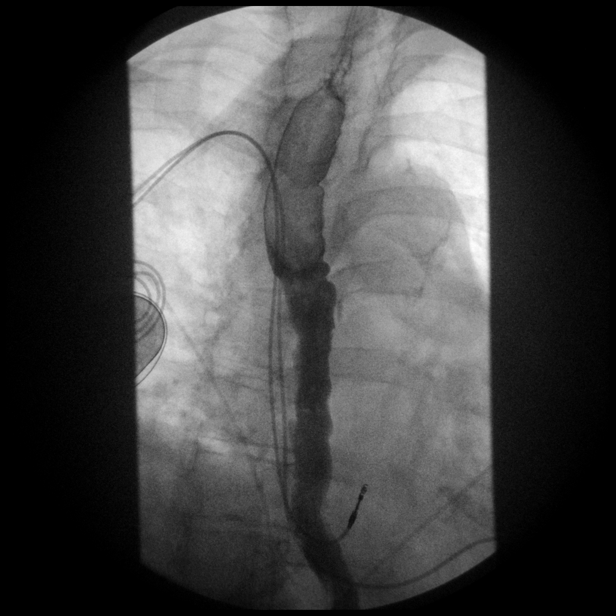

[Series 8: run · 1 of 1 slices shown (8 of 14)]
[im 1/1]
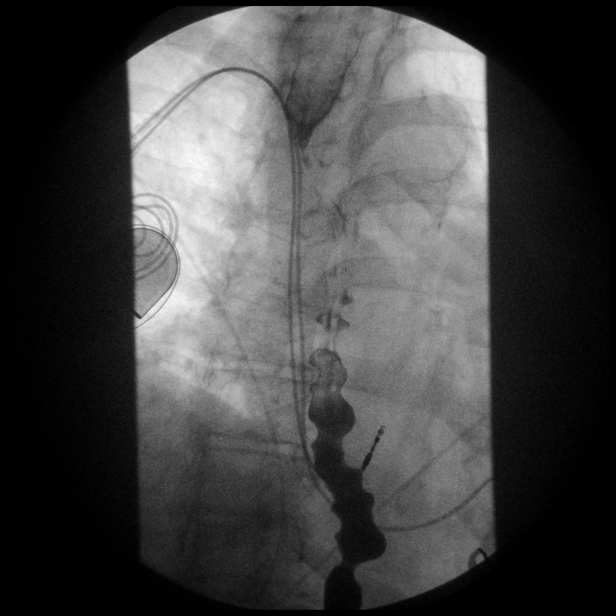

[Series 9: run · 1 of 1 slices shown (9 of 14)]
[im 1/1]
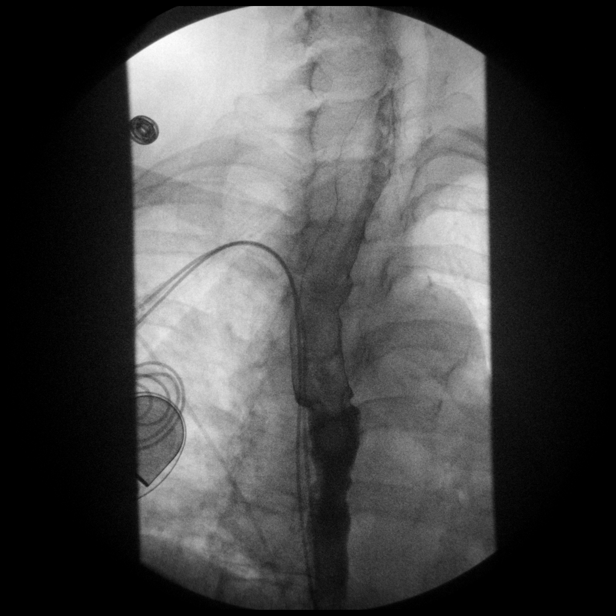

[Series 10: run · 1 of 1 slices shown (10 of 14)]
[im 1/1]
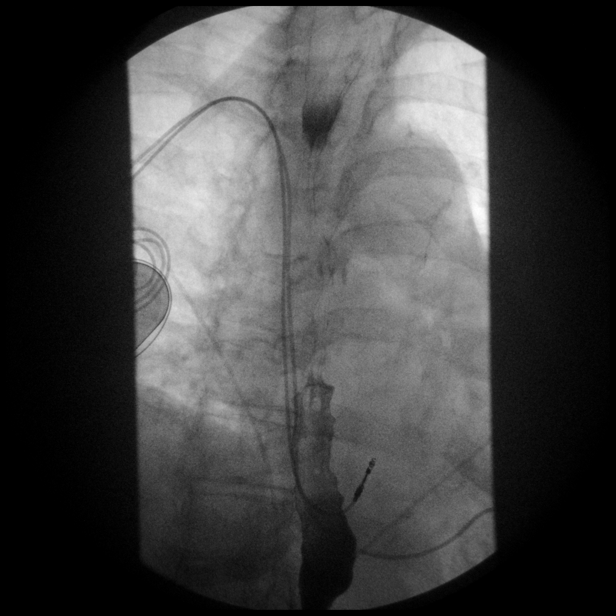

[Series 11: run · 1 of 1 slices shown (11 of 14)]
[im 1/1]
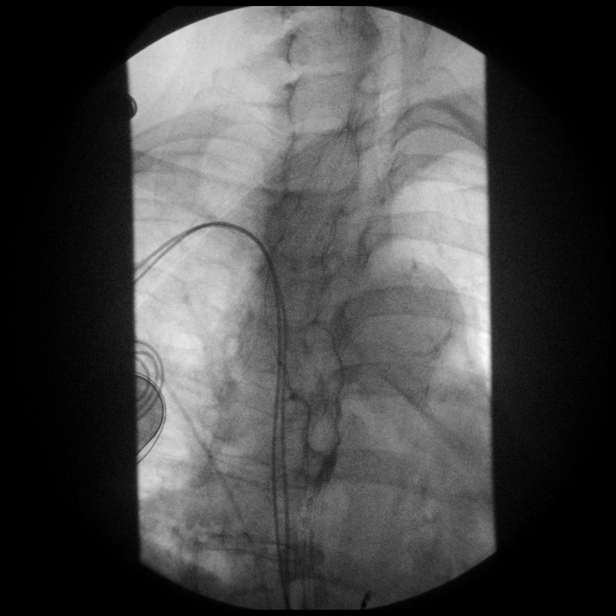

[Series 12: run · 1 of 1 slices shown (12 of 14)]
[im 1/1]
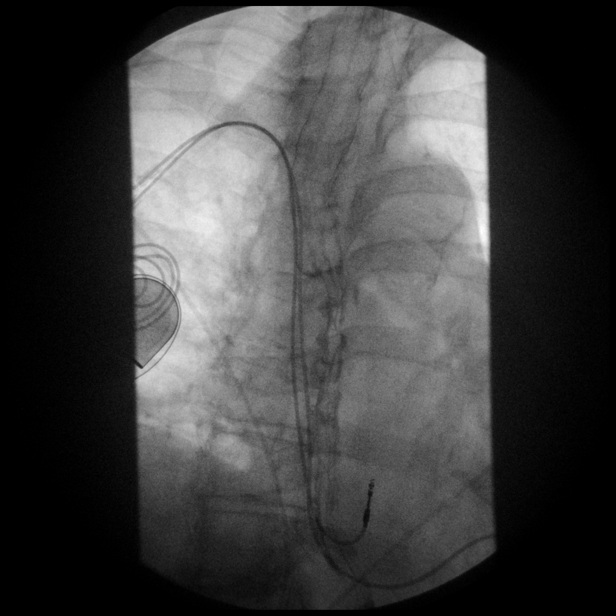

[Series 13: run · 1 of 1 slices shown (13 of 14)]
[im 1/1]
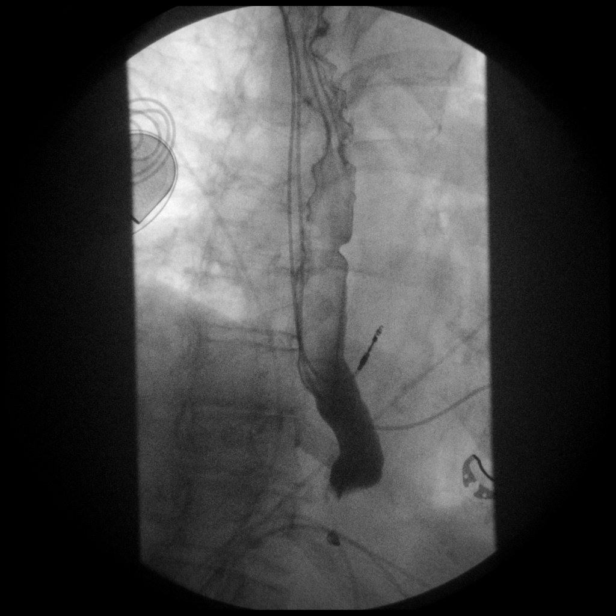

[Series 14: run · 1 of 1 slices shown (14 of 14)]
[im 1/1]
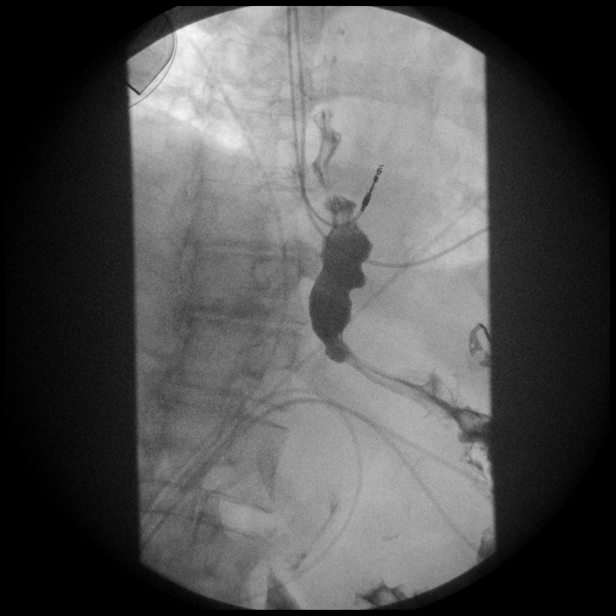

[14 of 14 positions shown; findings below may reference images not displayed]

FINDINGS: Due to patient condition, examination was performed in the recumbent
LPO position. Patient had difficulty drinking barium from a straw.
Realtime and static evaluation of the esophagus shows no definite
esophageal narrowing. Difficult to exclude narrowing at the
gastroesophageal junction. A barium tablet was not administered due
to patient condition. There are tertiary contractions. No definite
fold thickening.
IMPRESSION: 1. Limited examination due to patient condition, as described above.
2. No definite esophageal narrowing. Difficult to exclude narrowing
at the gastroesophageal junction.
3. Tertiary contractions are indicative of esophageal dysmotility.
